# Patient Record
Sex: Female | Born: 1935 | Race: White | Hispanic: No | Smoking: Never smoker
Health system: Southern US, Community
[De-identification: ages and names within clinical notes are randomized; demographics above are authoritative.]

## PROBLEM LIST (undated history)

## (undated) DIAGNOSIS — S2239XA Fracture of one rib, unspecified side, initial encounter for closed fracture: Secondary | ICD-10-CM

## (undated) DIAGNOSIS — E039 Hypothyroidism, unspecified: Secondary | ICD-10-CM

## (undated) DIAGNOSIS — I10 Essential (primary) hypertension: Secondary | ICD-10-CM

## (undated) DIAGNOSIS — E785 Hyperlipidemia, unspecified: Secondary | ICD-10-CM

## (undated) DIAGNOSIS — I2510 Atherosclerotic heart disease of native coronary artery without angina pectoris: Secondary | ICD-10-CM

## (undated) DIAGNOSIS — M353 Polymyalgia rheumatica: Secondary | ICD-10-CM

## (undated) DIAGNOSIS — E119 Type 2 diabetes mellitus without complications: Secondary | ICD-10-CM

## (undated) HISTORY — DX: Hyperlipidemia, unspecified: E78.5

## (undated) HISTORY — DX: Atherosclerotic heart disease of native coronary artery without angina pectoris: I25.10

## (undated) HISTORY — DX: Hypothyroidism, unspecified: E03.9

## (undated) HISTORY — DX: Polymyalgia rheumatica: M35.3

## (undated) HISTORY — DX: Fracture of one rib, unspecified side, initial encounter for closed fracture: S22.39XA

## (undated) HISTORY — PX: OTHER SURGICAL HISTORY: SHX169

## (undated) HISTORY — DX: Type 2 diabetes mellitus without complications: E11.9

## (undated) HISTORY — PX: CORONARY ARTERY BYPASS GRAFT: SHX141

---

## 1976-01-13 HISTORY — PX: ABDOMINAL HYSTERECTOMY: SHX81

## 2004-10-20 ENCOUNTER — Ambulatory Visit: Payer: Self-pay | Admitting: Internal Medicine

## 2010-03-26 ENCOUNTER — Ambulatory Visit: Payer: Self-pay | Admitting: Family Medicine

## 2010-05-14 ENCOUNTER — Ambulatory Visit: Payer: Self-pay | Admitting: Ophthalmology

## 2010-07-23 ENCOUNTER — Ambulatory Visit: Payer: Self-pay | Admitting: Ophthalmology

## 2011-06-05 ENCOUNTER — Ambulatory Visit: Payer: Self-pay

## 2012-06-07 ENCOUNTER — Ambulatory Visit: Payer: Self-pay

## 2014-04-10 DIAGNOSIS — Z0000 Encounter for general adult medical examination without abnormal findings: Secondary | ICD-10-CM | POA: Diagnosis not present

## 2014-04-10 DIAGNOSIS — I10 Essential (primary) hypertension: Secondary | ICD-10-CM | POA: Diagnosis not present

## 2014-06-05 ENCOUNTER — Ambulatory Visit
Admission: RE | Admit: 2014-06-05 | Discharge: 2014-06-05 | Disposition: A | Discharge status: Home / Self Care | Payer: Medicare PPO | Source: Ambulatory Visit | Attending: Unknown Physician Specialty | Admitting: Unknown Physician Specialty

## 2014-06-05 ENCOUNTER — Other Ambulatory Visit: Payer: Self-pay | Admitting: Unknown Physician Specialty

## 2014-06-05 DIAGNOSIS — R700 Elevated erythrocyte sedimentation rate: Secondary | ICD-10-CM

## 2014-06-05 DIAGNOSIS — R109 Unspecified abdominal pain: Secondary | ICD-10-CM

## 2014-06-05 DIAGNOSIS — R1030 Lower abdominal pain, unspecified: Secondary | ICD-10-CM

## 2014-06-05 DIAGNOSIS — R0602 Shortness of breath: Secondary | ICD-10-CM

## 2014-06-06 ENCOUNTER — Other Ambulatory Visit: Payer: Self-pay | Admitting: Unknown Physician Specialty

## 2014-06-06 ENCOUNTER — Ambulatory Visit
Admission: RE | Admit: 2014-06-06 | Discharge: 2014-06-06 | Disposition: A | Discharge status: Home / Self Care | Payer: Medicare PPO | Source: Ambulatory Visit | Attending: Unknown Physician Specialty | Admitting: Unknown Physician Specialty

## 2014-06-06 DIAGNOSIS — R700 Elevated erythrocyte sedimentation rate: Secondary | ICD-10-CM

## 2014-06-06 DIAGNOSIS — I700 Atherosclerosis of aorta: Secondary | ICD-10-CM | POA: Diagnosis not present

## 2014-06-06 DIAGNOSIS — K819 Cholecystitis, unspecified: Secondary | ICD-10-CM | POA: Diagnosis not present

## 2014-06-06 DIAGNOSIS — N281 Cyst of kidney, acquired: Secondary | ICD-10-CM | POA: Diagnosis not present

## 2014-06-06 DIAGNOSIS — R109 Unspecified abdominal pain: Secondary | ICD-10-CM | POA: Insufficient documentation

## 2014-06-06 HISTORY — DX: Essential (primary) hypertension: I10

## 2014-06-06 MED ORDER — IOHEXOL 300 MG/ML  SOLN
100.0000 mL | Freq: Once | INTRAMUSCULAR | Status: AC | PRN
  Administered 2014-06-06: 100 mL via INTRAVENOUS

## 2014-06-08 DIAGNOSIS — I10 Essential (primary) hypertension: Secondary | ICD-10-CM | POA: Insufficient documentation

## 2014-06-08 DIAGNOSIS — E1169 Type 2 diabetes mellitus with other specified complication: Secondary | ICD-10-CM | POA: Insufficient documentation

## 2014-06-08 DIAGNOSIS — E785 Hyperlipidemia, unspecified: Secondary | ICD-10-CM

## 2014-06-08 DIAGNOSIS — M353 Polymyalgia rheumatica: Secondary | ICD-10-CM

## 2014-06-08 DIAGNOSIS — E063 Autoimmune thyroiditis: Secondary | ICD-10-CM | POA: Insufficient documentation

## 2014-06-08 DIAGNOSIS — E039 Hypothyroidism, unspecified: Secondary | ICD-10-CM | POA: Insufficient documentation

## 2014-06-08 DIAGNOSIS — E038 Other specified hypothyroidism: Secondary | ICD-10-CM

## 2014-06-08 HISTORY — DX: Polymyalgia rheumatica: M35.3

## 2014-06-19 ENCOUNTER — Encounter: Payer: Self-pay | Admitting: Unknown Physician Specialty

## 2014-06-19 ENCOUNTER — Ambulatory Visit (INDEPENDENT_AMBULATORY_CARE_PROVIDER_SITE_OTHER): Payer: Medicare PPO | Admitting: Unknown Physician Specialty

## 2014-06-19 VITALS — BP 123/70 | HR 54 | Temp 98.4°F | Ht 60.0 in | Wt 125.6 lb

## 2014-06-19 DIAGNOSIS — M353 Polymyalgia rheumatica: Secondary | ICD-10-CM

## 2014-06-19 DIAGNOSIS — I10 Essential (primary) hypertension: Secondary | ICD-10-CM

## 2014-06-19 DIAGNOSIS — E785 Hyperlipidemia, unspecified: Secondary | ICD-10-CM

## 2014-06-19 NOTE — Patient Instructions (Addendum)
Take Prednisone with food.  Restart Levothyroxine and Vitamin D.  Monitor BP at home.    Polymyalgia Rheumatica Polymyalgia rheumatica (also called PMR or polymyalgia) is a rheumatologic (arthritic) condition that causes pain and morning stiffness in your neck, shoulders, and hips. It is an inflammatory condition. In some people, inflammation of certain structures in the shoulder, hips, or other joints can be seen on special testing. It does not cause joint destruction, as occurs in other arthritic conditions. It usually occurs after 79 years of age, and is more common as you age. It can be confused with several other diseases, but it is usually easily treated. People with PMR often have, or can develop, a more severe rheumatologic condition called giant cell arteritis (also called CGA or temporal arteritis).  CAUSES  The exact cause of PMR is not known.   There are genetic factors involved.  Viruses have been suspected in the cause of PMR. This has not been proven. SYMPTOMS   Aching, pain, and morning stiffness your neck, both shoulders, or both hips.  Symptoms usually start slowly and build gradually.  Morning stiffness usually lasts at least 30 minutes.  Swelling and tenderness in other joints of the arms, hands, legs, and feet may occur.  Swelling and inflammation in the wrists can cause nerve inflammation at the wrist (carpal tunnel syndrome).  You may also have low grade fever, fatigue, weakness, decreased appetite and weight loss. DIAGNOSIS   Your caregiver may suspect that you have PMR based on your description of your symptoms and on your exam.  Your caregiver will examine you to be sure you do not have diseases that can be confused with PMR. These diseases include rheumatoid arthritis, fibromyalgia, or thyroid disease.  Your caregiver should check for signs of giant cell arteritis. This can cause serious complications such as blindness.  Lab tests can help confirm that you  have PMR and not other diseases, but are sometimes inconclusive.  X-rays cannot show PMR. However, it can identify other diseases like rheumatoid arthritis. Your caregiver may have you see a specialist in arthritis and inflammatory diseases (rheumatologist). TREATMENT  The goal of treatment is relief of symptoms. Treatment does not shorten the course of the illness or prevent complications. With proper treatment, you usually feel better almost right away.   The initial treatment of PMR is usually a cortisone (steroid) medication. Your caregiver will help determine a starting dose. The dose is gradually reduced every few weeks to months. Treatment usually lasts one to three years.  Other stronger medications are rarely needed. They will only be prescribed if your symptoms do not get better on cortisone medication alone, or if they recur as the dose is reduced.  Cortisone medication can have different side effects. With the doses of cortisone needed for PMR, the side effects can affect bones and joints, blood sugar control in diabetes, and mood changes. Discuss this with your caregiver.  Your caregiver will evaluate you regularly during your treatment. They will do this in order to assess progress and to check for complications of the illness or treatment.  Physical therapy is sometimes useful. This is especially true if your joints are still stiff after other symptoms have improved. HOME CARE INSTRUCTIONS   Follow your caregiver's instructions. Do not change your dose of cortisone medication on your own.  Keep your appointments for follow-up lab tests and caregiver visits. Your lab tests need to be monitored. You must get checked periodically for giant cell arteritis.  Follow your caregiver's guidance regarding physical activity (usually no restrictions are needed) or physical therapy.  Your caregiver may have instructions to prevent or check for side effects from cortisone medication  (including bone density testing or treatment). Follow their instructions carefully. SEEK MEDICAL CARE IF:   You develop any side effects from treatment. Side effects can include:  Elevated blood pressure.  High blood sugar (or worsening of diabetes, if you are diabetic).  Difficulty fighting off infections.  Weight gain.  Weakness of the bones (osteoporosis).  Your aches, pains, morning stiffness, or other symptoms get worse with time. This is especially true after your dose of cortisone is reduced.  You develop new joint symptoms (pain, swelling, etc.) SEEK IMMEDIATE MEDICAL CARE IF:   You develop a severe headache.  You start vomiting.  You have problems with your vision.  You have an oral temperature above 102 F (38.9 C), not controlled by medicine. Document Released: 02/06/2004 Document Revised: 12/16/2011 Document Reviewed: 05/21/2008 [facility] Patient Information 2015 [employer] [place]. This information is not intended to replace advice given to you by your health care provider. Make sure you discuss any questions you have with your health care provider.

## 2014-06-19 NOTE — Progress Notes (Signed)
   BP 123/70 mmHg  Pulse 54  Temp(Src) 98.4 F (36.9 C) (Oral)  Ht 5' (1.524 m)  Wt 125 lb 9.6 oz (56.972 kg)  BMI 24.53 kg/m2  SpO2 95%   Subjective:    Patient ID: Kirsten Riley, female    DOB: 06/13/1931, 81 y.o.   MRN: [id number]  HPI: Kirsten Riley is a 81 y.o. female presenting on 06/19/2014 for Follow-up   Polymyalgia rheumatica: Pt is doing much better.  She continues to have bad days, but doing all her work other than mowing the lawn.  She is gardening and doing her housework.  She is still having some abdominal pain with lying down and getting up.    Hypertension/Hyperlipidemia: Stopped Nifedipine and statin.  Not taking anything besides her Prednisone.  Will need to restart Stain with history of CAD.    Relevant past medical, surgical, family and social history reviewed and updated as indicated. Interim medical history since our last visit reviewed. Allergies and medications reviewed and updated.  Review of Systems  Per HPI unless specifically indicated above     Objective:    BP 123/70 mmHg  Pulse 54  Temp(Src) 98.4 F (36.9 C) (Oral)  Ht 5' (1.524 m)  Wt 125 lb 9.6 oz (56.972 kg)  BMI 24.53 kg/m2  SpO2 95%  Wt Readings from Last 3 Encounters:  06/19/14 125 lb 9.6 oz (56.972 kg)  06/05/14 128 lb (58.06 kg)    Physical Exam  Constitutional: She is oriented to person, place, and time. She appears well-developed and well-nourished. No distress.  HENT:  Head: Normocephalic and atraumatic.  Eyes: Conjunctivae and lids are normal. Right eye exhibits no discharge. Left eye exhibits no discharge. No scleral icterus.  Cardiovascular: Normal rate and regular rhythm.   Pulmonary/Chest: Effort normal. No respiratory distress.  Abdominal: Normal appearance. She exhibits no distension. There is no splenomegaly or hepatomegaly. There is no tenderness.  Musculoskeletal: Normal range of motion.  Neurological: She is alert and oriented to person, place, and time.  Skin: Skin  is intact. No rash noted. No pallor.  Psychiatric: She has a normal mood and affect. Her behavior is normal. Judgment and thought content normal.          Assessment & Plan:   Problem List Items Addressed This Visit      Cardiovascular and Mediastinum   Essential hypertension, benign    Pt stopped Nifedipine.  BP is stable.  Reassess in 6 weeks        Other   Hyperlipidemia    Pt not taking Statin.  As for now, continue off the statin due to myalgias      Polymyalgia rheumatica - Primary    Pt with much improvement in symptoms.  Discussed slow taper off which we will start 6 weeks from now.              Follow up plan: Pt will restart Levothyroxine and Vitamin D.  Stay off Nifedipine and statins for now.   Recheck 6 weeks

## 2014-06-19 NOTE — Assessment & Plan Note (Signed)
Pt stopped Nifedipine.  BP is stable.  Reassess in 6 weeks

## 2014-06-19 NOTE — Assessment & Plan Note (Signed)
Pt not taking Statin.  As for now, continue off the statin due to myalgias

## 2014-06-19 NOTE — Assessment & Plan Note (Signed)
Pt with much improvement in symptoms.  Discussed slow taper off which we will start 6 weeks from now.

## 2014-06-20 ENCOUNTER — Other Ambulatory Visit: Payer: Self-pay

## 2014-06-20 ENCOUNTER — Telehealth: Payer: Self-pay | Admitting: Unknown Physician Specialty

## 2014-06-20 MED ORDER — PREDNISONE 10 MG PO TABS: 10.0000 mg | ORAL_TABLET | Freq: Two times a day (BID) | ORAL | Status: DC

## 2014-06-20 NOTE — Telephone Encounter (Signed)
Spoke with patients pharmacy, she will be out of her prednisone on Friday, is she to continue this medication? If so [facility]

## 2014-06-20 NOTE — Telephone Encounter (Signed)
Pt called stated when she came in the other day she was not given her RX for Prednisone. Pharm [facility]. Please call with any concerns or questions @ [phone]. Thanks.

## 2014-07-04 ENCOUNTER — Telehealth: Payer: Self-pay | Admitting: Unknown Physician Specialty

## 2014-07-04 MED ORDER — PREDNISONE 10 MG PO TABS: 10.0000 mg | ORAL_TABLET | Freq: Two times a day (BID) | ORAL | Status: DC

## 2014-07-04 NOTE — Telephone Encounter (Signed)
Pt called stated Elnita Maxwell has her on prednisone. States Elnita Maxwell is going on vacation next month and she will need 20 more pills to last her until her next appt with Elnita Maxwell. Pharm is [facility]. Thanks.

## 2014-07-04 NOTE — Telephone Encounter (Signed)
Routing to provider. Practice partner number is [phone].

## 2014-07-31 ENCOUNTER — Ambulatory Visit (INDEPENDENT_AMBULATORY_CARE_PROVIDER_SITE_OTHER): Payer: Medicare PPO | Admitting: Unknown Physician Specialty

## 2014-07-31 ENCOUNTER — Encounter: Payer: Self-pay | Admitting: Unknown Physician Specialty

## 2014-07-31 VITALS — BP 108/69 | HR 91 | Temp 98.5°F | Ht 60.1 in | Wt 119.8 lb

## 2014-07-31 DIAGNOSIS — I10 Essential (primary) hypertension: Secondary | ICD-10-CM

## 2014-07-31 DIAGNOSIS — M353 Polymyalgia rheumatica: Secondary | ICD-10-CM

## 2014-07-31 DIAGNOSIS — E785 Hyperlipidemia, unspecified: Secondary | ICD-10-CM

## 2014-07-31 MED ORDER — LOVASTATIN 40 MG PO TABS: 40.0000 mg | ORAL_TABLET | Freq: Every day | ORAL | Status: DC

## 2014-07-31 MED ORDER — PREDNISONE 10 MG PO TABS: 10.0000 mg | ORAL_TABLET | Freq: Two times a day (BID) | ORAL | Status: DC

## 2014-07-31 NOTE — Assessment & Plan Note (Signed)
Re-start Lovastatin 

## 2014-07-31 NOTE — Assessment & Plan Note (Signed)
Will restart Lovastatin before tapering steroids.  Begin taper in about 1 month.  Will refer to rheumatology if having problems with the taper.

## 2014-07-31 NOTE — Patient Instructions (Signed)
We will restart your cholesterol medications.  Recheck in 1 months for steroid taper.

## 2014-07-31 NOTE — Progress Notes (Signed)
   BP 108/69 mmHg  Pulse 91  Temp(Src) 98.5 F (36.9 C)  Ht 5' 0.1" (1.527 m)  Wt 119 lb 12.8 oz (54.341 kg)  BMI 23.31 kg/m2  SpO2 97%  LMP  (LMP Unknown)   Subjective:    Patient ID: Kirsten Riley, female    DOB: 05/26/1931, 81 y.o.   MRN: [id number]  HPI: Kirsten Riley is a 81 y.o. female presenting on 07/31/2014 for Hypertension and Hyperlipidemia   Polymyalgia rheumatica: Pt is doing much better but does not feel she is back to normal.   For the past 2 weeks she started mowing her yard.  She is gardening and doing her housework.  She is still having some abdominal pain with lying down and getting up.    Hypertension/Hyperlipidemia: Stopped Nifedipine and statin.  Not taking anything besides her Prednisone. She does have a history of CAD so statin therapy will be important.    Hypothyroid:  Restarted Levothyroxine.    Relevant past medical, surgical, family and social history reviewed and updated as indicated. Interim medical history since our last visit reviewed. Allergies and medications reviewed and updated.  Review of Systems  Per HPI unless specifically indicated above     Objective:    BP 108/69 mmHg  Pulse 91  Temp(Src) 98.5 F (36.9 C)  Ht 5' 0.1" (1.527 m)  Wt 119 lb 12.8 oz (54.341 kg)  BMI 23.31 kg/m2  SpO2 97%  LMP  (LMP Unknown)  Wt Readings from Last 3 Encounters:  07/31/14 119 lb 12.8 oz (54.341 kg)  06/19/14 125 lb 9.6 oz (56.972 kg)  06/05/14 128 lb (58.06 kg)    Physical Exam  Constitutional: She is oriented to person, place, and time. She appears well-developed and well-nourished. No distress.  HENT:  Head: Normocephalic and atraumatic.  Eyes: Conjunctivae and lids are normal. Right eye exhibits no discharge. Left eye exhibits no discharge. No scleral icterus.  Cardiovascular: Normal rate, regular rhythm and normal heart sounds.   Pulmonary/Chest: Effort normal and breath sounds normal. No respiratory distress.  Abdominal: Normal appearance.  There is no splenomegaly or hepatomegaly.  Musculoskeletal: Normal range of motion.  Neurological: She is alert and oriented to person, place, and time.  Skin: Skin is warm, dry and intact. No rash noted. No pallor.  Psychiatric: She has a normal mood and affect. Her behavior is normal. Judgment and thought content normal.        Assessment & Plan:   Problem List Items Addressed This Visit      Cardiovascular and Mediastinum   Essential hypertension, benign - Primary    Resolved for now.  Stay off Nifedipine.        Relevant Medications   lovastatin (MEVACOR) 40 MG tablet     Other   Hyperlipidemia    Restart Lovastatin      Relevant Medications   lovastatin (MEVACOR) 40 MG tablet   Polymyalgia rheumatica    Will restart Lovastatin before tapering steroids.  Begin taper in about 1 month.  Will refer to rheumatology if having problems with the taper.              Follow up plan: 4 weeks for steroid taper

## 2014-07-31 NOTE — Assessment & Plan Note (Signed)
Resolved for now.  Stay off Nifedipine.

## 2014-08-28 ENCOUNTER — Ambulatory Visit (INDEPENDENT_AMBULATORY_CARE_PROVIDER_SITE_OTHER): Payer: Medicare PPO | Admitting: Unknown Physician Specialty

## 2014-08-28 ENCOUNTER — Encounter: Payer: Self-pay | Admitting: Unknown Physician Specialty

## 2014-08-28 VITALS — BP 99/60 | HR 88 | Temp 97.7°F | Ht 60.1 in | Wt 114.0 lb

## 2014-08-28 DIAGNOSIS — M353 Polymyalgia rheumatica: Secondary | ICD-10-CM | POA: Diagnosis not present

## 2014-08-28 MED ORDER — PREDNISONE 5 MG PO TABS: 5.0000 mg | ORAL_TABLET | Freq: Three times a day (TID) | ORAL | Status: DC

## 2014-08-28 NOTE — Progress Notes (Signed)
   BP 99/60 mmHg  Pulse 88  Temp(Src) 97.7 F (36.5 C)  Ht 5' 0.1" (1.527 m)  Wt 114 lb (51.71 kg)  BMI 22.18 kg/m2  SpO2 96%  LMP  (LMP Unknown)   Subjective:    Patient ID: Kirsten Riley, female    DOB: 12-Oct-1931, 81 y.o.   MRN: [id number]  HPI: Kirsten Riley is a 81 y.o. female  Chief Complaint  Patient presents with  . Follow-up    pt was seen for polymyalgia rheumatica at last visit     Relevant past medical, surgical, family and social history reviewed and updated as indicated. Interim medical history since our last visit reviewed. Allergies and medications reviewed and updated.  Polymyalgia Rheumatica: Reports some stiffness and weakness but improved. She continues her daily activities including mowing the yard, weeding the flowerbeds and all household chores but at a slower pace.  Reports feeling very tired after a full day of working. Denies any change in level of fatigue or stiffness since restarting the lovastatin.  Review of Systems  Constitutional: Negative.  Negative for activity change and appetite change.  HENT: Negative.   Eyes: Negative.   Respiratory: Negative.  Negative for cough, chest tightness, shortness of breath, wheezing and stridor.   Cardiovascular: Negative.  Negative for chest pain, palpitations and leg swelling.  Skin: Negative.  Negative for color change, pallor, rash and wound.  Psychiatric/Behavioral: Negative.  Negative for confusion, sleep disturbance and agitation. The patient is not nervous/anxious.    Per HPI unless specifically indicated above     Objective:    BP 99/60 mmHg  Pulse 88  Temp(Src) 97.7 F (36.5 C)  Ht 5' 0.1" (1.527 m)  Wt 114 lb (51.71 kg)  BMI 22.18 kg/m2  SpO2 96%  LMP  (LMP Unknown)  Wt Readings from Last 3 Encounters:  08/28/14 114 lb (51.71 kg)  07/31/14 119 lb 12.8 oz (54.341 kg)  06/19/14 125 lb 9.6 oz (56.972 kg)    Physical Exam  Constitutional: She is oriented to person, place, and time. She appears  well-developed and well-nourished. No distress.  HENT:  Head: Normocephalic and atraumatic.  Neck: Normal range of motion.  Cardiovascular: Normal rate, regular rhythm and normal heart sounds.  Exam reveals no gallop and no friction rub.   No murmur heard. Pulmonary/Chest: Effort normal and breath sounds normal. No respiratory distress. She has no wheezes. She has no rales. She exhibits no tenderness.  Neurological: She is alert and oriented to person, place, and time.  Skin: Skin is warm and dry. No rash noted. She is not diaphoretic. No erythema. No pallor.  Psychiatric: She has a normal mood and affect. Her behavior is normal. Judgment and thought content normal.    No results found for this or any previous visit.    Assessment & Plan:   Problem List Items Addressed This Visit      Unprioritized   Polymyalgia rheumatica - Primary    Started Lovastatin one month ago. Will decrease prednisone to  three times daily and reassess in 6 weeks          Follow up plan: Return in about 6 weeks (around 10/09/2014).

## 2014-08-28 NOTE — Assessment & Plan Note (Signed)
Started Lovastatin one month ago. Will decrease prednisone to  three times daily and reassess in 6 weeks

## 2014-10-09 ENCOUNTER — Encounter: Payer: Self-pay | Admitting: Unknown Physician Specialty

## 2014-10-09 ENCOUNTER — Ambulatory Visit (INDEPENDENT_AMBULATORY_CARE_PROVIDER_SITE_OTHER): Payer: Medicare PPO | Admitting: Unknown Physician Specialty

## 2014-10-09 VITALS — BP 102/67 | HR 89 | Temp 98.3°F | Ht 60.2 in | Wt 110.0 lb

## 2014-10-09 DIAGNOSIS — M353 Polymyalgia rheumatica: Secondary | ICD-10-CM

## 2014-10-09 DIAGNOSIS — Z23 Encounter for immunization: Secondary | ICD-10-CM

## 2014-10-09 DIAGNOSIS — E099 Drug or chemical induced diabetes mellitus without complications: Secondary | ICD-10-CM | POA: Diagnosis not present

## 2014-10-09 DIAGNOSIS — N3945 Continuous leakage: Secondary | ICD-10-CM | POA: Diagnosis not present

## 2014-10-09 DIAGNOSIS — R32 Unspecified urinary incontinence: Secondary | ICD-10-CM

## 2014-10-09 LAB — MICROSCOPIC EXAMINATION: Renal Epithel, UA: NONE SEEN /hpf

## 2014-10-09 LAB — GLUCOSE HEMOCUE WAIVED: GLU HEMOCUE WAIVED: 333 mg/dL — AB (ref 65–99)

## 2014-10-09 MED ORDER — GLIPIZIDE ER 5 MG PO TB24
5.0000 mg | ORAL_TABLET | Freq: Every day | ORAL | Status: DC
Start: 2014-10-09 — End: 2014-11-09

## 2014-10-09 MED ORDER — PREDNISONE 5 MG PO TABS: 5.0000 mg | ORAL_TABLET | Freq: Two times a day (BID) | ORAL | Status: DC

## 2014-10-09 NOTE — Progress Notes (Signed)
BP 102/67 mmHg  Pulse 89  Temp(Src) 98.3 F (36.8 C)  Ht 5' 0.2" (1.529 m)  Wt 110 lb (49.896 kg)  BMI 21.34 kg/m2  SpO2 98%  LMP  (LMP Unknown)   Subjective:    Patient ID: Kirsten Riley, female    DOB: 10-20-1931, 81 y.o.   MRN: [id number]  HPI: Kirsten Riley is a 81 y.o. female  Chief Complaint  Patient presents with  . Follow-up    pt is here for 6 week follow-up for polymyalgia rheumatica   Polymyalgia rheumatica: She is now having weakness of hips/lower extremities. She continues to do her housework and yard work but is still at a slower pace than previously. She can work hard on day then needs to rest the next day. She has difficulty getting up from a kneeling position. She recently knelt down to plug in a landline phone and was unable to get up. She did not have enough strength to push or pull herself up. She had to roll to the living room and eventually get herself into the chair.  Urinary incontinence: New onset within the last few weeks. Currently wearing depends.   No pain and burning.    Diabetes: She has been on prednisone 15-20mg  daily for four months with A1C of 6.1 prior to initiation of prednisone. She has increased weakness over the last month and new onset incontinence. Denies any changes to vision, sensation or chest pain. Becomes tired easy.   Relevant past medical, surgical, family and social history reviewed and updated as indicated. Interim medical history since our last visit reviewed. Allergies and medications reviewed and updated.  Review of Systems  Constitutional: Negative.  Negative for fever, chills, activity change and appetite change.  HENT: Negative.  Negative for congestion, postnasal drip, sinus pressure and sore throat.   Eyes: Negative.  Negative for discharge and redness.  Respiratory: Negative.  Negative for cough, chest tightness, shortness of breath, wheezing and stridor.   Cardiovascular: Negative.  Negative for chest pain, palpitations and  leg swelling.  Genitourinary: Negative for urgency, hematuria and difficulty urinating.  Musculoskeletal: Negative.  Negative for myalgias, back pain, arthralgias and gait problem.  Skin: Negative.  Negative for color change, pallor and rash.  Neurological: Negative for headaches.  Psychiatric/Behavioral: Negative.  Negative for confusion, sleep disturbance and self-injury. The patient is not nervous/anxious.     Per HPI unless specifically indicated above     Objective:    BP 102/67 mmHg  Pulse 89  Temp(Src) 98.3 F (36.8 C)  Ht 5' 0.2" (1.529 m)  Wt 110 lb (49.896 kg)  BMI 21.34 kg/m2  SpO2 98%  LMP  (LMP Unknown)  Wt Readings from Last 3 Encounters:  10/09/14 110 lb (49.896 kg)  08/28/14 114 lb (51.71 kg)  07/31/14 119 lb 12.8 oz (54.341 kg)    Physical Exam  Constitutional: She is oriented to person, place, and time. She appears well-developed and well-nourished. No distress.  HENT:  Head: Normocephalic and atraumatic.  Eyes: Conjunctivae are normal.  Cardiovascular: Normal rate, regular rhythm and normal heart sounds.  Exam reveals no gallop and no friction rub.   No murmur heard. Pulmonary/Chest: Effort normal and breath sounds normal. No respiratory distress. She has no wheezes. She has no rales. She exhibits no tenderness.  Musculoskeletal: Normal range of motion.  Neurological: She is alert and oriented to person, place, and time.  Skin: Skin is warm and dry. No rash noted. She is not diaphoretic.  No erythema. No pallor.  Psychiatric: She has a normal mood and affect. Her behavior is normal. Judgment and thought content normal.    No results found for this or any previous visit.    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 Will decrease prednisone to  twice daily. Will begin glipizide  and recheck in 3-4 weeks.    Relevant Medications    glipiZIDE (GLUCOTROL XL) 5 MG 24 hr tablet    Other Relevant Orders    Comprehensive metabolic panel       Discussed patient and plan of care with Dr. Laural Benes.    Follow up plan: Return for 3-4 weeks.

## 2014-10-09 NOTE — Assessment & Plan Note (Addendum)
No improvement in symptoms. Referral to Rheumatologist

## 2014-10-10 LAB — COMPREHENSIVE METABOLIC PANEL
A/G RATIO: 1.7 (ref 1.1–2.5)
ALT: 50 IU/L — AB (ref 0–32)
AST: 40 IU/L (ref 0–40)
Albumin: 3.8 g/dL (ref 3.5–4.7)
Alkaline Phosphatase: 96 IU/L (ref 39–117)
BILIRUBIN TOTAL: 1.1 mg/dL (ref 0.0–1.2)
BUN/Creatinine Ratio: 16 (ref 11–26)
BUN: 10 mg/dL (ref 8–27)
CHLORIDE: 95 mmol/L — AB (ref 97–108)
CO2: 22 mmol/L (ref 18–29)
Calcium: 9.3 mg/dL (ref 8.7–10.3)
Creatinine, Ser: 0.63 mg/dL (ref 0.57–1.00)
GFR calc non Af Amer: 84 mL/min/{1.73_m2} (ref >59)
GFR, EST AFRICAN AMERICAN: 97 mL/min/{1.73_m2} (ref >59)
Globulin, Total: 2.2 g/dL (ref 1.5–4.5)
Glucose: 317 mg/dL — ABNORMAL HIGH (ref 65–99)
Potassium: 4.3 mmol/L (ref 3.5–5.2)
Sodium: 137 mmol/L (ref 134–144)
TOTAL PROTEIN: 6 g/dL (ref 6.0–8.5)

## 2014-10-11 LAB — UA/M W/RFLX CULTURE, ROUTINE

## 2014-10-29 DIAGNOSIS — E119 Type 2 diabetes mellitus without complications: Secondary | ICD-10-CM | POA: Diagnosis not present

## 2014-10-29 DIAGNOSIS — M25511 Pain in right shoulder: Secondary | ICD-10-CM | POA: Diagnosis not present

## 2014-10-29 DIAGNOSIS — M353 Polymyalgia rheumatica: Secondary | ICD-10-CM | POA: Diagnosis not present

## 2014-10-29 DIAGNOSIS — G8929 Other chronic pain: Secondary | ICD-10-CM | POA: Diagnosis not present

## 2014-10-29 DIAGNOSIS — M791 Myalgia: Secondary | ICD-10-CM | POA: Diagnosis not present

## 2014-10-29 DIAGNOSIS — M25512 Pain in left shoulder: Secondary | ICD-10-CM | POA: Diagnosis not present

## 2014-11-09 ENCOUNTER — Ambulatory Visit (INDEPENDENT_AMBULATORY_CARE_PROVIDER_SITE_OTHER): Payer: Medicare PPO | Admitting: Unknown Physician Specialty

## 2014-11-09 ENCOUNTER — Encounter: Payer: Self-pay | Admitting: Unknown Physician Specialty

## 2014-11-09 VITALS — BP 100/65 | HR 90 | Temp 98.3°F | Ht 60.2 in | Wt 120.8 lb

## 2014-11-09 DIAGNOSIS — E099 Drug or chemical induced diabetes mellitus without complications: Secondary | ICD-10-CM

## 2014-11-09 DIAGNOSIS — T380X5A Adverse effect of glucocorticoids and synthetic analogues, initial encounter: Secondary | ICD-10-CM | POA: Diagnosis not present

## 2014-11-09 DIAGNOSIS — M353 Polymyalgia rheumatica: Secondary | ICD-10-CM | POA: Diagnosis not present

## 2014-11-09 LAB — BAYER DCA HB A1C WAIVED: HB A1C: 11.6 % — AB (ref <7.0)

## 2014-11-09 LAB — GLUCOSE HEMOCUE WAIVED: GLU HEMOCUE WAIVED: 363 mg/dL — AB (ref 65–99)

## 2014-11-09 MED ORDER — GLIPIZIDE ER 10 MG PO TB24: 10.0000 mg | ORAL_TABLET | Freq: Every day | ORAL | Status: DC

## 2014-11-09 NOTE — Progress Notes (Signed)
BP 100/65 mmHg  Pulse 90  Temp(Src) 98.3 F (36.8 C)  Ht 5' 0.2" (1.529 m)  Wt 120 lb 12.8 oz (54.795 kg)  BMI 23.44 kg/m2  SpO2 97%  LMP  (LMP Unknown)   Subjective:    Patient ID: Kirsten Riley, female    DOB: 12/18/1931, 81 y.o.   MRN: [id number]  HPI: Kirsten FlightJoann H Riley is a 81 y.o. female  Chief Complaint  Patient presents with  . Follow-up    pt states she is here for a follow-up on polymyalgia rheumatica   Polymyalgia rheumatic She states she is still having abdominal tenderness and is having hair loss she denies any other pain or fatigue.  She states she is able to do housework and yard work at a regular pace.  She is currently taking 7.5 mg a day prescribed by Dr. [facility].  She as a follow-up appointment with Dr. Gavin [facility] on November 16, 2014  Urinary Incontinence She states this has improved since she started taking the glipizide.  She wears depends only when she is running errands.  Denies pain, burning, or urgency  Diabetes She is currently taking 7.5 mg prednisone daily.  She denies diplopia, vision loss, or blurred vision.   Pertinent negatives denies chest pain, palpitations, polydipsia, shortness of breath, or edema  Relevant past medical, surgical, family and social history reviewed and updated as indicated. Interim medical history since our last visit reviewed. Allergies and medications reviewed and updated.  Review of Systems  Constitutional: Positive for appetite change. Negative for fever, chills, diaphoresis, activity change, fatigue and unexpected weight change.  Respiratory: Negative.   Cardiovascular: Negative.   Gastrointestinal: Negative for nausea, vomiting, diarrhea and constipation.       Abdominal tenderness intermittently when she coughs or lies down  Endocrine: Positive for polyphagia and polyuria.  Genitourinary: Negative for dysuria, urgency, frequency, hematuria, decreased urine volume and difficulty urinating.       Urinary incontinence   Musculoskeletal: Positive for arthralgias.  Skin: Negative.   Neurological: Negative.     Per HPI unless specifically indicated above     Objective:    BP 100/65 mmHg  Pulse 90  Temp(Src) 98.3 F (36.8 C)  Ht 5' 0.2" (1.529 m)  Wt 120 lb 12.8 oz (54.795 kg)  BMI 23.44 kg/m2  SpO2 97%  LMP  (LMP Unknown)  Wt Readings from Last 3 Encounters:  11/09/14 120 lb 12.8 oz (54.795 kg)  10/09/14 110 lb (49.896 kg)  08/28/14 114 lb (51.71 kg)    Physical Exam  Constitutional: She is oriented to person, place, and time. She appears well-developed and well-nourished. No distress.  HENT:  Head: Normocephalic and atraumatic.  Neck: Normal range of motion. Neck supple.  Cardiovascular: Normal rate, regular rhythm, normal heart sounds and intact distal pulses.   Pulmonary/Chest: Effort normal and breath sounds normal. No respiratory distress. She has no wheezes.  Musculoskeletal: Normal range of motion. She exhibits no edema or tenderness.  Neurological: She is alert and oriented to person, place, and time.  Skin: Skin is warm and dry. No rash noted. She is not diaphoretic. No erythema. No pallor.  Psychiatric: She has a normal mood and affect. Her behavior is normal. Judgment and thought content normal.     Assessment & Plan:   Problem List Items Addressed This Visit      Unprioritized   Polymyalgia rheumatica (HCC)    Managed by Dr Gavin [facility]ern[facility] follow-up appointment with him November 16, 2014  Other Visit Diagnoses    Steroid-induced diabetes (HCC)    -  Primary    Serum Glucose 363 Hgb A1C 11.6 increased Glipizide to 10 mg daily.  Pt has an appointment with Dr. Gavin Potters on November 16, 2014. Consider Insulin next visit.      Relevant Medications    glipiZIDE (GLUCOTROL XL) 10 MG 24 hr tablet    Other Relevant Orders    Comprehensive metabolic panel    Bayer DCA Hb A1c Waived    Glucose Hemocue Waived    Ambulatory referral to diabetic education        Follow up  plan: Return in about 4 weeks (around 12/07/2014) for f/u also needs lipid panel, CMP glucose, B12 for Macrocytosi.

## 2014-11-09 NOTE — Assessment & Plan Note (Signed)
Managed by Dr Gavin [facility] follow-up appointment with him November 16, 2014

## 2014-11-16 DIAGNOSIS — E119 Type 2 diabetes mellitus without complications: Secondary | ICD-10-CM | POA: Diagnosis not present

## 2014-11-16 DIAGNOSIS — M353 Polymyalgia rheumatica: Secondary | ICD-10-CM | POA: Diagnosis not present

## 2014-12-10 ENCOUNTER — Ambulatory Visit (INDEPENDENT_AMBULATORY_CARE_PROVIDER_SITE_OTHER): Payer: Medicare PPO | Admitting: Unknown Physician Specialty

## 2014-12-10 ENCOUNTER — Encounter: Payer: Self-pay | Admitting: Unknown Physician Specialty

## 2014-12-10 VITALS — BP 125/76 | HR 69 | Temp 98.2°F | Ht 60.3 in | Wt 117.4 lb

## 2014-12-10 DIAGNOSIS — E785 Hyperlipidemia, unspecified: Secondary | ICD-10-CM | POA: Diagnosis not present

## 2014-12-10 DIAGNOSIS — E099 Drug or chemical induced diabetes mellitus without complications: Secondary | ICD-10-CM | POA: Diagnosis not present

## 2014-12-10 DIAGNOSIS — D539 Nutritional anemia, unspecified: Secondary | ICD-10-CM

## 2014-12-10 DIAGNOSIS — E119 Type 2 diabetes mellitus without complications: Secondary | ICD-10-CM

## 2014-12-10 DIAGNOSIS — M353 Polymyalgia rheumatica: Secondary | ICD-10-CM | POA: Diagnosis not present

## 2014-12-10 HISTORY — DX: Type 2 diabetes mellitus without complications: E11.9

## 2014-12-10 LAB — LIPID PANEL PICCOLO, WAIVED
CHOLESTEROL PICCOLO, WAIVED: 117 mg/dL (ref <200)
Chol/HDL Ratio Piccolo,Waive: 3.1 mg/dL
HDL Chol Piccolo, Waived: 37 mg/dL — ABNORMAL LOW (ref >59)
LDL Chol Calc Piccolo Waived: 57 mg/dL (ref <100)
TRIGLYCERIDES PICCOLO,WAIVED: 114 mg/dL (ref <150)
VLDL Chol Calc Piccolo,Waive: 23 mg/dL (ref <30)

## 2014-12-10 LAB — GLUCOSE HEMOCUE WAIVED: Glu Hemocue Waived: 120 mg/dL — ABNORMAL HIGH (ref 65–99)

## 2014-12-10 MED ORDER — GLIPIZIDE ER 5 MG PO TB24: 5.0000 mg | ORAL_TABLET | Freq: Every day | ORAL | Status: DC

## 2014-12-10 NOTE — Assessment & Plan Note (Signed)
BS is 120 this AM.  Will decrease Glucotrol to 5 mg.

## 2014-12-10 NOTE — Progress Notes (Signed)
BP 125/76 mmHg  Pulse 69  Temp(Src) 98.2 F (36.8 C)  Ht 5' 0.3" (1.532 m)  Wt 117 lb 6.4 oz (53.252 kg)  BMI 22.69 kg/m2  SpO2 96%  LMP  (LMP Unknown)   Subjective:    Patient ID: Kirsten Riley, female    DOB: [birth year], 81 y.o.   MRN: [id number]  HPI: Kirsten Riley is a 81 y.o. female  Chief Complaint  Patient presents with  . Diabetes    pt states she has not ate ay sweets she was here last and would like to have blood checked today  . Pain    pt states she has been having more knee (back of knees), and hand pain. States it feels like hands are swollen.   Pt is here with her daughter.    Polymyalgia rheumatic She states she is still having abdominal tenderness and is having hair loss she denies any other pain or fatigue.  She states she is able to do housework and yard work at a regular pace.  She is currently taking 5 mg a day prescribed by Dr. Gavin Potters.  She feels like her knees and hands are painful.  She is wondering if I can increase the dose of prednisone to increase her arthritis.    Diabetes She is currently taking 5 mg prednisone daily.  She denies diplopia, vision loss, or blurred vision.   Pertinent negatives denies chest pain, palpitations, polydipsia, shortness of breath, or edema.   Relevant past medical, surgical, family and social history reviewed and updated as indicated. Interim medical history since our last visit reviewed. Allergies and medications reviewed and updated.  Review of Systems  Constitutional: Positive for appetite change. Negative for fever, chills, diaphoresis, activity change, fatigue and unexpected weight change.  Respiratory: Negative.   Cardiovascular: Negative.   Gastrointestinal: Negative for nausea, vomiting, diarrhea and constipation.       Abdominal tenderness intermittently when she coughs or lies down  Endocrine: Positive for polyphagia and polyuria.  Genitourinary: Negative for dysuria, urgency, frequency, hematuria, decreased  urine volume and difficulty urinating.       Urinary incontinence  Musculoskeletal: Positive for arthralgias.  Skin: Negative.   Neurological: Negative.     Per HPI unless specifically indicated above     Objective:    BP 125/76 mmHg  Pulse 69  Temp(Src) 98.2 F (36.8 C)  Ht 5' 0.3" (1.532 m)  Wt 117 lb 6.4 oz (53.252 kg)  BMI 22.69 kg/m2  SpO2 96%  LMP  (LMP Unknown)  Wt Readings from Last 3 Encounters:  12/10/14 117 lb 6.4 oz (53.252 kg)  11/09/14 120 lb 12.8 oz (54.795 kg)  10/09/14 110 lb (49.896 kg)    Physical Exam  Constitutional: She is oriented to person, place, and time. She appears well-developed and well-nourished. No distress.  HENT:  Head: Normocephalic and atraumatic.  Neck: Normal range of motion. Neck supple.  Cardiovascular: Normal rate, regular rhythm, normal heart sounds and intact distal pulses.   Pulmonary/Chest: Effort normal and breath sounds normal. No respiratory distress. She has no wheezes.  Musculoskeletal: Normal range of motion. She exhibits no edema or tenderness.  Neurological: She is alert and oriented to person, place, and time.  Skin: Skin is warm and dry. No rash noted. She is not diaphoretic. No erythema. No pallor.  Psychiatric: She has a normal mood and affect. Her behavior is normal. Judgment and thought content normal.   Glucose is 120 this AM  Assessment & Plan:   Problem List Items Addressed This Visit      Unprioritized   Hyperlipidemia    Excellent cholesterol results.      Relevant Orders   Lipid Panel Piccolo, Waived   Polymyalgia rheumatica (HCC)    Discussed Prednisone and symptoms seem more arthritic.  Restart Glucosamine and Chondroiton.  Will defer decision on prednisone to Dr. Gavin Riley.  I am afraid that going up on Prednisone will worsen DM which is now under control.        Diabetes mellitus (HCC) - Primary    BS is 120 this AM.  Will decrease Glucotrol to 5 mg.        Relevant Medications    glipiZIDE (GLUCOTROL XL) 5 MG 24 hr tablet   Other Relevant Orders   Comprehensive metabolic panel   Glucose Hemocue Waived    Other Visit Diagnoses    Macrocytic anemia        Relevant Orders    Vitamin B12        Follow up plan: Return in about 3 months (around 03/12/2015).

## 2014-12-10 NOTE — Assessment & Plan Note (Signed)
Discussed Prednisone and symptoms seem more arthritic.  Restart Glucosamine and Chondroiton.  Will defer decision on prednisone to Dr. Gavin [facility].  I am afraid that going up on Prednisone will worsen DM which is now under control.

## 2014-12-10 NOTE — Assessment & Plan Note (Signed)
Excellent cholesterol results.

## 2014-12-11 LAB — COMPREHENSIVE METABOLIC PANEL
A/G RATIO: 1.8 (ref 1.1–2.5)
ALK PHOS: 96 IU/L (ref 39–117)
ALT: 29 IU/L (ref 0–32)
AST: 30 IU/L (ref 0–40)
Albumin: 3.9 g/dL (ref 3.5–4.7)
BUN/Creatinine Ratio: 12 (ref 11–26)
BUN: 8 mg/dL (ref 8–27)
Bilirubin Total: 0.6 mg/dL (ref 0.0–1.2)
CO2: 22 mmol/L (ref 18–29)
CREATININE: 0.69 mg/dL (ref 0.57–1.00)
Calcium: 10 mg/dL (ref 8.7–10.3)
Chloride: 104 mmol/L (ref 97–106)
GFR calc Af Amer: 94 mL/min/{1.73_m2} (ref >59)
GFR calc non Af Amer: 82 mL/min/{1.73_m2} (ref >59)
GLOBULIN, TOTAL: 2.2 g/dL (ref 1.5–4.5)
Glucose: 109 mg/dL — ABNORMAL HIGH (ref 65–99)
POTASSIUM: 4.3 mmol/L (ref 3.5–5.2)
Sodium: 141 mmol/L (ref 136–144)
Total Protein: 6.1 g/dL (ref 6.0–8.5)

## 2014-12-11 LAB — VITAMIN B12: VITAMIN B 12: 1104 pg/mL — AB (ref 211–946)

## 2014-12-17 DIAGNOSIS — M353 Polymyalgia rheumatica: Secondary | ICD-10-CM | POA: Diagnosis not present

## 2015-01-09 ENCOUNTER — Emergency Department: Payer: Medicare PPO

## 2015-01-09 ENCOUNTER — Emergency Department
Admission: EM | Admit: 2015-01-09 | Discharge: 2015-01-09 | Disposition: A | Discharge status: Home / Self Care | Payer: Medicare PPO | Attending: Emergency Medicine | Admitting: Emergency Medicine

## 2015-01-09 ENCOUNTER — Encounter: Payer: Self-pay | Admitting: Medical Oncology

## 2015-01-09 DIAGNOSIS — Y9289 Other specified places as the place of occurrence of the external cause: Secondary | ICD-10-CM | POA: Insufficient documentation

## 2015-01-09 DIAGNOSIS — R0781 Pleurodynia: Secondary | ICD-10-CM

## 2015-01-09 DIAGNOSIS — W1839XA Other fall on same level, initial encounter: Secondary | ICD-10-CM | POA: Insufficient documentation

## 2015-01-09 DIAGNOSIS — E119 Type 2 diabetes mellitus without complications: Secondary | ICD-10-CM | POA: Diagnosis not present

## 2015-01-09 DIAGNOSIS — I10 Essential (primary) hypertension: Secondary | ICD-10-CM | POA: Insufficient documentation

## 2015-01-09 DIAGNOSIS — Y998 Other external cause status: Secondary | ICD-10-CM | POA: Diagnosis not present

## 2015-01-09 DIAGNOSIS — Z79899 Other long term (current) drug therapy: Secondary | ICD-10-CM | POA: Insufficient documentation

## 2015-01-09 DIAGNOSIS — Z880 Allergy status to penicillin: Secondary | ICD-10-CM | POA: Diagnosis not present

## 2015-01-09 DIAGNOSIS — S2231XA Fracture of one rib, right side, initial encounter for closed fracture: Secondary | ICD-10-CM | POA: Diagnosis not present

## 2015-01-09 DIAGNOSIS — S3991XA Unspecified injury of abdomen, initial encounter: Secondary | ICD-10-CM | POA: Insufficient documentation

## 2015-01-09 DIAGNOSIS — S29001A Unspecified injury of muscle and tendon of front wall of thorax, initial encounter: Secondary | ICD-10-CM | POA: Diagnosis present

## 2015-01-09 DIAGNOSIS — Z7952 Long term (current) use of systemic steroids: Secondary | ICD-10-CM | POA: Insufficient documentation

## 2015-01-09 DIAGNOSIS — R109 Unspecified abdominal pain: Secondary | ICD-10-CM | POA: Diagnosis not present

## 2015-01-09 DIAGNOSIS — Y9389 Activity, other specified: Secondary | ICD-10-CM | POA: Insufficient documentation

## 2015-01-09 DIAGNOSIS — W19XXXA Unspecified fall, initial encounter: Secondary | ICD-10-CM

## 2015-01-09 MED ORDER — OXYCODONE-ACETAMINOPHEN 5-325 MG PO TABS: 1.0000 | ORAL_TABLET | Freq: Four times a day (QID) | ORAL | Status: DC | PRN

## 2015-01-09 NOTE — ED Notes (Signed)
Patient transported to X-ray 

## 2015-01-09 NOTE — ED Notes (Signed)
Pt ambulatory to triage with reports of falling Sunday for unknown reason and since then has been having worsening pain to rt side of rib cage and around to her back. Pt denies sob or dizziness.

## 2015-01-09 NOTE — Discharge Instructions (Signed)
Please take your pain medication as needed, as prescribed. Please use your incentive spirometer twice an hour while awake. Please follow-up with your primary care physician in the next 2-3 days for recheck. Return to the emergency department for any worsening pain, or any trouble breathing.   Rib Fracture A rib fracture is a break or crack in one of the bones of the ribs. The ribs are like a cage that goes around your upper chest. A broken or cracked rib is often painful, but most do not cause other problems. Most rib fractures heal on their own in 1-3 months. HOME CARE  Avoid activities that cause pain to the injured area. Protect your injured area.  Slowly increase activity as told by your doctor.  Take medicine as told by your doctor.  Put ice on the injured area for the first 1-2 days after you have been treated or as told by your doctor.  Put ice in a plastic bag.  Place a towel between your skin and the bag.  Leave the ice on for 15-20 minutes at a time, every 2 hours while you are awake.  Do deep breathing as told by your doctor. You may be told to:  Take deep breaths many times a day.  Cough many times a day while hugging a pillow.  Use a device (incentive spirometer) to perform deep breathing many times a day.  Drink enough fluids to keep your pee (urine) clear or pale yellow.   Do not wear a rib belt or binder. These do not allow you to breathe deeply. GET HELP RIGHT AWAY IF:   You have a fever.  You have trouble breathing.   You cannot stop coughing.  You cough up thick or bloody spit (mucus).   You feel sick to your stomach (nauseous), throw up (vomit), or have belly (abdominal) pain.   Your pain gets worse and medicine does not help.  MAKE SURE YOU:   Understand these instructions.  Will watch your condition.  Will get help right away if you are not doing well or get worse.   This information is not intended to replace advice given to you by your  health care provider. Make sure you discuss any questions you have with your health care provider.   Document Released: 10/08/2007 Document Revised: 04/25/2012 Document Reviewed: 03/02/2012 Elsevier Interactive Patient Education [employer]2016 [employer] Inc.

## 2015-01-09 NOTE — ED Provider Notes (Signed)
[facility] Emergency Department Provider Note  Time seen: 9:57 AM  I have reviewed the triage vital signs and the nursing notes.   HISTORY  Chief Complaint Fall    HPI Kirsten Riley is a 81 y.o. female with a past medical history of hypertension, CAD, polymyalgia rheumatica, hyperlipidemia, hypothyroidism, diabetes who presents to the emergency department for right-sided pain following a fall. According to the patient she fell 3 days ago on Christmas day. States she was attempting to move a chair and she fell landing her right side on another chair. States since that time she's had considerable pain to her right chest and right abdomen. Denies hitting her head, denies loss of consciousness. Does not take any blood thinners. Describes her pain as moderate, severe. Press on the chest or upper abdomen. Patient states a history of polymyalgia rheumatica, and states he abdominal pain is largely unchanged for the past 8 months however the chest pain is new.    Past Medical History  Diagnosis Date  . Hypertension   . CAD (coronary artery disease)   . Polymyalgia rheumatica (HCC) 06/08/2014  . Hyperlipidemia   . Hypothyroidism   . Diabetes mellitus (HCC) 12/10/2014    Patient Active Problem List   Diagnosis Date Noted  . Diabetes mellitus (HCC) 12/10/2014  . Essential hypertension, benign 06/08/2014  . Hyperlipidemia 06/08/2014  . Polymyalgia rheumatica (HCC) 06/08/2014  . Hypothyroidism 06/08/2014    Past Surgical History  Procedure Laterality Date  . Abdominal hysterectomy  1978    partial  . Hypercholesterol    . Hypothyroid    . Coronary artery bypass graft      Current Outpatient Rx  Name  Route  Sig  Dispense  Refill  . calcium-vitamin D 250-100 MG-UNIT per tablet   Oral   Take 1 tablet by mouth 2 (two) times daily.         Marland Kitchen. glipiZIDE (GLUCOTROL XL) 5 MG 24 hr tablet   Oral   Take 1 tablet (5 mg total) by mouth daily with breakfast.   30  tablet   3   . glucosamine-chondroitin 500-400 MG tablet   Oral   Take 1 tablet by mouth daily.         Marland Kitchen. levothyroxine (SYNTHROID, LEVOTHROID) 75 MCG tablet   Oral   Take 75 mcg by mouth daily before breakfast.         . lovastatin (MEVACOR) 40 MG tablet   Oral   Take 1 tablet (40 mg total) by mouth at bedtime.   90 tablet   3   . Multiple Vitamin (MULTIVITAMIN WITH MINERALS) TABS tablet   Oral   Take 1 tablet by mouth daily.         . predniSONE (DELTASONE) 5 MG tablet   Oral   Take 1 tablet (5 mg total) by mouth 2 (two) times daily with a meal.   60 tablet   1     Allergies Penicillins and Sulfa antibiotics  Family History  Problem Relation Age of Onset  . Heart disease Mother   . Stroke Mother   . Hypertension Mother   . Cancer Father   . Cancer Sister     breast, ovarian, pancreatic  . Heart disease Sister     x2  . Cancer Brother   . Heart disease Brother   . Cancer Brother   . Heart disease Brother   . Cancer Brother   . Heart disease Brother  Social History Social History  Substance Use Topics  . Smoking status: Never Smoker   . Smokeless tobacco: Never Used  . Alcohol Use: No    Review of Systems Constitutional: Negative for fever. Cardiovascular: Positive for right-sided chest pain Respiratory: Negative for shortness of breath. Gastrointestinal: Positive for right-sided abdominal pain. Genitourinary: Negative for dysuria. Musculoskeletal: Negative for back pain. Negative for neck pain. Neurological: Negative for headaches, focal weakness or numbness. 10-point ROS otherwise negative.  ____________________________________________   PHYSICAL EXAM:  VITAL SIGNS: ED Triage Vitals  Enc Vitals Group     BP 01/09/15 0928 144/77 mmHg     Pulse Rate 01/09/15 0928 94     Resp 01/09/15 0928 18     Temp 01/09/15 0928 97.6 F (36.4 C)     Temp Source 01/09/15 0928 Oral     SpO2 01/09/15 0928 98 %     Weight 01/09/15 0928 115 lb  (52.164 kg)     Height 01/09/15 0928  (1.549 m)     Head Cir --      Peak Flow --      Pain Score 01/09/15 0928 7     Pain Loc --      Pain Edu? --      Excl. in GC? --     Constitutional: Alert and oriented. Well appearing and in no distress. Eyes: Normal exam ENT   Head: Normocephalic and atraumatic.   Mouth/Throat: Mucous membranes are moist. Cardiovascular: Normal rate, regular rhythm. No murmur Respiratory: Normal respiratory effort without tachypnea nor retractions. Breath sounds are clear and equal bilaterally. No wheezes/rales/rhonchi. Moderate right lateral chest wall tenderness palpation. Gastrointestinal: Soft, moderate right flank tenderness palpation. No rebound or guarding. No distention. Musculoskeletal: Nontender with normal range of motion in all extremities. No cervical spine tenderness. Neurologic:  Normal speech and language. No gross focal neurologic deficits Skin:  Skin is warm, dry and intact. No ecchymosis noted Psychiatric: Mood and affect are normal. Speech and behavior are normal.  ____________________________________________   RADIOLOGY  CT shows right seventh rib fracture.  ____________________________________________   INITIAL IMPRESSION / ASSESSMENT AND PLAN / ED COURSE  Pertinent labs & imaging results that were available during my care of the patient were reviewed by me and considered in my medical decision making (see chart for details).  Patient with moderate right flank and right chest tenderness palpation. States right flank pain is ongoing for 8 months or the right chest pain is new. We will obtain CT scans without contrast of the chest and abdomen and to help further evaluate. Patient is agreeable to plan, no distress, no discomfort at rest, however moderate discomfort with palpation or movement.  CT shows nondisplaced right seventh rib fracture, otherwise within normal limits. We'll discharge home with Percocet for pain  control, and an incentive spirometer. Patient to follow-up with her primary care doctor.  ____________________________________________   FINAL CLINICAL IMPRESSION(S) / ED DIAGNOSES  Right chest wall pain Right abdominal pain Right rib fracture   Minna Antis, MD 01/09/15 1120

## 2015-01-09 NOTE — ED Notes (Signed)
Pt remains to have pain in right ribs. Does not want a pain pill and took tylenol at home

## 2015-01-09 NOTE — ED Notes (Signed)
Waiting on CT results

## 2015-02-26 ENCOUNTER — Telehealth: Payer: Self-pay

## 2015-02-26 MED ORDER — GLIPIZIDE ER 5 MG PO TB24: 5.0000 mg | ORAL_TABLET | Freq: Every day | ORAL | Status: DC

## 2015-02-26 NOTE — Telephone Encounter (Signed)
Pharmacy sent a fax requesting a 90 day supply for patient's glipizide. Pharmacy is [facility].

## 2015-02-27 DIAGNOSIS — M0600 Rheumatoid arthritis without rheumatoid factor, unspecified site: Secondary | ICD-10-CM | POA: Insufficient documentation

## 2015-02-27 DIAGNOSIS — M0609 Rheumatoid arthritis without rheumatoid factor, multiple sites: Secondary | ICD-10-CM | POA: Insufficient documentation

## 2015-03-12 ENCOUNTER — Encounter: Payer: Self-pay | Admitting: Unknown Physician Specialty

## 2015-03-12 ENCOUNTER — Ambulatory Visit (INDEPENDENT_AMBULATORY_CARE_PROVIDER_SITE_OTHER): Payer: Medicare Other | Admitting: Unknown Physician Specialty

## 2015-03-12 VITALS — BP 111/66 | HR 69 | Temp 98.0°F | Ht 58.8 in | Wt 112.2 lb

## 2015-03-12 DIAGNOSIS — E139 Other specified diabetes mellitus without complications: Secondary | ICD-10-CM | POA: Diagnosis not present

## 2015-03-12 LAB — BAYER DCA HB A1C WAIVED: HB A1C (BAYER DCA - WAIVED): 6.4 % (ref <7.0)

## 2015-03-12 MED ORDER — GLIPIZIDE ER 2.5 MG PO TB24: 2.5000 mg | ORAL_TABLET | Freq: Every day | ORAL | Status: DC

## 2015-03-12 NOTE — Progress Notes (Signed)
BP 111/66 mmHg  Pulse 69  Temp(Src) 98 F (36.7 C)  Ht 4' 10.8" (1.494 m)  Wt 112 lb 3.2 oz (50.894 kg)  BMI 22.80 kg/m2  SpO2 98%  LMP  (LMP Unknown)   Subjective:    Patient ID: Kirsten Riley, female    DOB: Sep 01, 1931, 82 y.o.   MRN: [id number]  HPI: Kirsten Riley is a 82 y.o. female  Chief Complaint  Patient presents with  . Diabetes  . Hyperlipidemia  . Hypertension   Diabetes Thought to be related to Prednisone use from polymyalgia rheumatica. She does not check her blood sugar at home.  She is currently on Glipizide daily.  The rheumatologist is slowly weaning her off Prednisone and has started Methotrexate  Hypertension Using medications without difficulty Average home BPs Not checking   No problems or lightheadedness No chest pain with exertion or shortness of breath No Edema   Hyperlipidemia Using medications without problems: No Muscle aches: yes due to PMR Exercise: [employer] Christmas and hurt her back.  Whe is planning to return to the gym    Relevant past medical, surgical, family and social history reviewed and updated as indicated. Interim medical history since our last visit reviewed. Allergies and medications reviewed and updated.  Review of Systems  Per HPI unless specifically indicated above     Objective:    BP 111/66 mmHg  Pulse 69  Temp(Src) 98 F (36.7 C)  Ht 4' 10.8" (1.494 m)  Wt 112 lb 3.2 oz (50.894 kg)  BMI 22.80 kg/m2  SpO2 98%  LMP  (LMP Unknown)  Wt Readings from Last 3 Encounters:  03/12/15 112 lb 3.2 oz (50.894 kg)  01/09/15 115 lb (52.164 kg)  12/10/14 117 lb 6.4 oz (53.252 kg)    Physical Exam  Constitutional: She is oriented to person, place, and time. She appears well-developed and well-nourished. No distress.  HENT:  Head: Normocephalic and atraumatic.  Eyes: Conjunctivae and lids are normal. Right eye exhibits no discharge. Left eye exhibits no discharge. No scleral icterus.  Neck: Normal range of motion. Neck  supple. No JVD present. Carotid bruit is not present.  Cardiovascular: Normal rate, regular rhythm and normal heart sounds.   Pulmonary/Chest: Effort normal and breath sounds normal.  Abdominal: Normal appearance. There is no splenomegaly or hepatomegaly.  Musculoskeletal: Normal range of motion.  Neurological: She is alert and oriented to person, place, and time.  Skin: Skin is warm, dry and intact. No rash noted. No pallor.  Psychiatric: She has a normal mood and affect. Her behavior is normal. Judgment and thought content normal.    Results for orders placed or performed in visit on 12/10/14  Lipid Panel Piccolo, [employer]  Result Value Ref Range   Cholesterol Piccolo, Waived 117 <200 mg/dL   HDL Chol Piccolo, Waived 37 (L) >59 mg/dL   Triglycerides Piccolo,Waived 114 <150 mg/dL   Chol/HDL Ratio Piccolo,Waive 3.1 mg/dL   LDL Chol Calc Piccolo Waived 57 <100 mg/dL   VLDL Chol Calc Piccolo,Waive 23 <30 mg/dL  Comprehensive metabolic panel  Result Value Ref Range   Glucose 109 (H) 65 - 99 mg/dL   BUN 8 8 - 27 mg/dL   Creatinine, Ser 4.09 0.57 - 1.00 mg/dL   GFR calc non Af Amer 82 >59 mL/min/1.73   GFR calc Af Amer 94 >59 mL/min/1.73   BUN/Creatinine Ratio 12 11 - 26   Sodium 141 136 - 144 mmol/L   Potassium 4.3 3.5 - 5.2 mmol/L  Chloride 104 97 - 106 mmol/L   CO2 22 18 - 29 mmol/L   Calcium 10.0 8.7 - 10.3 mg/dL   Total Protein 6.1 6.0 - 8.5 g/dL   Albumin 3.9 3.5 - 4.7 g/dL   Globulin, Total 2.2 1.5 - 4.5 g/dL   Albumin/Globulin Ratio 1.8 1.1 - 2.5   Bilirubin Total 0.6 0.0 - 1.2 mg/dL   Alkaline Phosphatase 96 39 - 117 IU/L   AST 30 0 - 40 IU/L   ALT 29 0 - 32 IU/L  Glucose Hemocue Waived  Result Value Ref Range   Glu Hemocue Waived 120 (H) 65 - 99 mg/dL  Vitamin Z61  Result Value Ref Range   Vitamin B-12 1104 (H) 211 - 946 pg/mL      Assessment & Plan:   Problem List Items Addressed This Visit    None    Visit Diagnoses    Secondary diabetes (HCC)    -  Primary     Relevant Medications    glipiZIDE (GLUCOTROL XL) 2.5 MG 24 hr tablet    Other Relevant Orders    Bayer DCA Hb A1c Waived    Comprehensive metabolic panel       Decrease Glucotrol to 2.5 mg.  Recheck in 1 month and consider stopping.    Follow up plan: Return in about 4 weeks (around 04/09/2015).

## 2015-03-12 NOTE — Assessment & Plan Note (Signed)
Hgb A1C is 6.4.  Decrease Glucotrol to 2.5 mg.

## 2015-03-13 LAB — COMPREHENSIVE METABOLIC PANEL
ALBUMIN: 4.4 g/dL (ref 3.5–4.7)
ALT: 19 IU/L (ref 0–32)
AST: 22 IU/L (ref 0–40)
Albumin/Globulin Ratio: 2.2 (ref 1.1–2.5)
Alkaline Phosphatase: 142 IU/L — ABNORMAL HIGH (ref 39–117)
BUN / CREAT RATIO: 15 (ref 11–26)
BUN: 11 mg/dL (ref 8–27)
Bilirubin Total: 0.8 mg/dL (ref 0.0–1.2)
CALCIUM: 10.1 mg/dL (ref 8.7–10.3)
CO2: 22 mmol/L (ref 18–29)
Chloride: 103 mmol/L (ref 96–106)
Creatinine, Ser: 0.75 mg/dL (ref 0.57–1.00)
GFR, EST AFRICAN AMERICAN: 86 mL/min/{1.73_m2} (ref >59)
GFR, EST NON AFRICAN AMERICAN: 75 mL/min/{1.73_m2} (ref >59)
GLOBULIN, TOTAL: 2 g/dL (ref 1.5–4.5)
Glucose: 104 mg/dL — ABNORMAL HIGH (ref 65–99)
POTASSIUM: 4.6 mmol/L (ref 3.5–5.2)
SODIUM: 143 mmol/L (ref 134–144)
Total Protein: 6.4 g/dL (ref 6.0–8.5)

## 2015-04-09 ENCOUNTER — Ambulatory Visit (INDEPENDENT_AMBULATORY_CARE_PROVIDER_SITE_OTHER): Payer: Medicare Other | Admitting: Unknown Physician Specialty

## 2015-04-09 ENCOUNTER — Encounter: Payer: Self-pay | Admitting: Unknown Physician Specialty

## 2015-04-09 VITALS — BP 113/70 | HR 66 | Temp 97.9°F | Ht 58.2 in | Wt 111.2 lb

## 2015-04-09 DIAGNOSIS — M545 Low back pain, unspecified: Secondary | ICD-10-CM | POA: Insufficient documentation

## 2015-04-09 DIAGNOSIS — Z5181 Encounter for therapeutic drug level monitoring: Secondary | ICD-10-CM | POA: Diagnosis not present

## 2015-04-09 DIAGNOSIS — E099 Drug or chemical induced diabetes mellitus without complications: Secondary | ICD-10-CM | POA: Diagnosis not present

## 2015-04-09 LAB — GLUCOSE HEMOCUE WAIVED: Glu Hemocue Waived: 119 mg/dL — ABNORMAL HIGH (ref 65–99)

## 2015-04-09 NOTE — Progress Notes (Signed)
BP 113/70 mmHg  Pulse 66  Temp(Src) 97.9 F (36.6 C)  Ht 4' 10.2" (1.478 m)  Wt 111 lb 3.2 oz (50.44 kg)  BMI 23.09 kg/m2  SpO2 98%  LMP  (LMP Unknown)   Subjective:    Patient ID: Kirsten Riley, female    DOB: 03/07/1931, 82 y.o.   MRN: [id number]  HPI: Kirsten FlightJoann H Vayda is a [age] y.o. female  Chief Complaint  Patient presents with  . Diabetes   Pt presents for follow-up on secondary diabetes related to prednisone use. Pt states that she is currently taking Prednisone 2.5 mg and scheduled to see Rheumatologist tomorrow. Per pt, her Rheumatologist is to discontinue her prednisone. Pt is taking her Glipizide 2.5 mg daily for diabetes. Pt denies complications with medications.   Diabetes:  Using medications without difficulties No hypoglycemic episodes. No hyperglycemic episodes. Feet problems: denies  Blood Sugars averaging: does not check her glucose at home    Back Injury: Pt reports past back injury from a fall that occurred during christmas. Per pt states she has had episodic minor pain with certain movements. Pt is requesting a back brace to help with movement and prevent pain. Pt states that she has stiffness that occurs upon waking and uses Acetaminophen throughout the day. Pt states that she takes "two pills every 6 hours". Pt states that the pills are extra strength. Pt states that she is doing better than prior with the acute injury in December. However, states that she still needs something to help her through the day.  Relevant past medical, surgical, family and social history reviewed and updated as indicated. Interim medical history since our last visit reviewed. Allergies and medications reviewed and updated.  Review of Systems  Constitutional: Negative for activity change, appetite change, fatigue and unexpected weight change.  HENT: Negative.   Eyes: Negative.  Negative for visual disturbance.  Respiratory: Negative.   Cardiovascular: Negative.   Endocrine: Negative.    Genitourinary: Negative.   Musculoskeletal: Positive for back pain (chronic back pain from previous injury in December ). Negative for myalgias.  Skin: Negative.   Neurological: Negative.  Negative for dizziness, weakness and light-headedness.    Per HPI unless specifically indicated above     Objective:    BP 113/70 mmHg  Pulse 66  Temp(Src) 97.9 F (36.6 C)  Ht 4' 10.2" (1.478 m)  Wt 111 lb 3.2 oz (50.44 kg)  BMI 23.09 kg/m2  SpO2 98%  LMP  (LMP Unknown)  Wt Readings from Last 3 Encounters:  04/09/15 111 lb 3.2 oz (50.44 kg)  03/12/15 112 lb 3.2 oz (50.894 kg)  01/09/15 115 lb (52.164 kg)    Physical Exam  Constitutional: She is oriented to person, place, and time. She appears well-developed and well-nourished. No distress.  HENT:  Head: Normocephalic and atraumatic.  Eyes: Conjunctivae and EOM are normal. Pupils are equal, round, and reactive to light.  Neck: Normal range of motion. Neck supple.  Cardiovascular: Normal rate and regular rhythm.   Pulmonary/Chest: Effort normal and breath sounds normal. No respiratory distress. She exhibits no tenderness.  Abdominal: Soft. Bowel sounds are normal. There is no tenderness.  Musculoskeletal: Normal range of motion. She exhibits no edema or tenderness.  Neurological: She is alert and oriented to person, place, and time.  Skin: Skin is warm and dry.  Psychiatric: She has a normal mood and affect. Her behavior is normal. Judgment and thought content normal.  Nursing note and vitals reviewed.  Results for orders placed or performed in visit on 04/09/15  Glucose Hemocue Waived  Result Value Ref Range   Glu Hemocue Waived 119 (H) 65 - 99 mg/dL      Assessment & Plan:   Problem List Items Addressed This Visit      Unprioritized   Diabetes mellitus (HCC) - Primary    Pt taking Glipizide related to prednisone induced hyperglycemia. Pt is taking medictaion without difficulty; denies symptoms related to hypoglycemia. Last  HbgA1c was 6.4. Collected fasting glucose, noted 119 mg/dL. Pt told to discontinue Glipizide. Follow-up in 1 month       Relevant Orders   Glucose Hemocue Waived (Completed)   Medication monitoring encounter    Pt reports taking two extra strength tylenol tablets, daily every 6 hours for back discomfort. CMP ordered to review risk for organ damage related to chronic tylenol use. Discussed the daily recommended amount of tylenol and other adjunctive therapies that could be used to alleviate back pain.       Relevant Orders   Comprehensive metabolic panel   Low back pain    Pt reports  back pain with daily use of Tylenol. Pt offered physical therapy.       Relevant Orders   Ambulatory referral to Physical Therapy       Follow up plan: Return in about 1 month (around 05/10/2015).

## 2015-04-09 NOTE — Patient Instructions (Signed)

## 2015-04-09 NOTE — Assessment & Plan Note (Addendum)
Pt reports  back pain with daily use of Tylenol. Pt offered physical therapy.

## 2015-04-09 NOTE — Assessment & Plan Note (Signed)
Pt reports taking two extra strength tylenol tablets, daily every 6 hours for back discomfort. CMP ordered to review risk for organ damage related to chronic tylenol use. Discussed the daily recommended amount of tylenol and other adjunctive therapies that could be used to alleviate back pain.

## 2015-04-09 NOTE — Assessment & Plan Note (Addendum)
Pt taking Glipizide related to prednisone induced hyperglycemia. Pt is taking medictaion without difficulty; denies symptoms related to hypoglycemia. Last HbgA1c was 6.4. Collected fasting glucose, noted 119 mg/dL. Pt told to discontinue Glipizide. Follow-up in 1 month

## 2015-04-10 LAB — COMPREHENSIVE METABOLIC PANEL
ALBUMIN: 4.2 g/dL (ref 3.5–4.7)
ALT: 19 IU/L (ref 0–32)
AST: 21 IU/L (ref 0–40)
Albumin/Globulin Ratio: 1.9 (ref 1.2–2.2)
Alkaline Phosphatase: 130 IU/L — ABNORMAL HIGH (ref 39–117)
BUN / CREAT RATIO: 25 (ref 11–26)
BUN: 17 mg/dL (ref 8–27)
Bilirubin Total: 0.5 mg/dL (ref 0.0–1.2)
CO2: 19 mmol/L (ref 18–29)
CREATININE: 0.69 mg/dL (ref 0.57–1.00)
Calcium: 10.2 mg/dL (ref 8.7–10.3)
Chloride: 101 mmol/L (ref 96–106)
GFR, EST AFRICAN AMERICAN: 94 mL/min/{1.73_m2} (ref >59)
GFR, EST NON AFRICAN AMERICAN: 82 mL/min/{1.73_m2} (ref >59)
GLOBULIN, TOTAL: 2.2 g/dL (ref 1.5–4.5)
GLUCOSE: 104 mg/dL — AB (ref 65–99)
Potassium: 4.2 mmol/L (ref 3.5–5.2)
SODIUM: 140 mmol/L (ref 134–144)
TOTAL PROTEIN: 6.4 g/dL (ref 6.0–8.5)

## 2015-05-13 ENCOUNTER — Ambulatory Visit (INDEPENDENT_AMBULATORY_CARE_PROVIDER_SITE_OTHER): Payer: Medicare Other | Admitting: Unknown Physician Specialty

## 2015-05-13 ENCOUNTER — Encounter: Payer: Self-pay | Admitting: Unknown Physician Specialty

## 2015-05-13 VITALS — BP 124/70 | HR 62 | Temp 97.3°F | Ht 59.5 in | Wt 114.8 lb

## 2015-05-13 DIAGNOSIS — E099 Drug or chemical induced diabetes mellitus without complications: Secondary | ICD-10-CM

## 2015-05-13 LAB — GLUCOSE HEMOCUE WAIVED: Glu Hemocue Waived: 164 mg/dL — ABNORMAL HIGH (ref 65–99)

## 2015-05-13 MED ORDER — GLIPIZIDE ER 2.5 MG PO TB24: 2.5000 mg | ORAL_TABLET | Freq: Every day | ORAL | Status: DC

## 2015-05-13 NOTE — Assessment & Plan Note (Signed)
Blood sugar is 164 this AM fasting.  Restart Glipizide in the AM.

## 2015-05-13 NOTE — Progress Notes (Signed)
BP 124/70 mmHg  Pulse 62  Temp(Src) 97.3 F (36.3 C)  Ht 4' 11.5" (1.511 m)  Wt 114 lb 12.8 oz (52.073 kg)  BMI 22.81 kg/m2  SpO2 98%  LMP  (LMP Unknown)   Subjective:    Patient ID: Kirsten Riley, female    DOB: 11/22/1931, 82 y.o.   MRN: [id number]  HPI: Kirsten Riley is a 82 y.o. female  Chief Complaint  Patient presents with  . Diabetes    1 month f/u   Back pain Better with back brace.  Never did go to PT.  Following pain in other joints with [facility]. [facility]  Diabetes Off her Prednisone and Glipiziede.  Denies abnormal thirst or increased urination.  "Everything I eat is sugar free."    Relevant past medical, surgical, family and social history reviewed and updated as indicated. Interim medical history since our last visit reviewed. Allergies and medications reviewed and updated.  Review of Systems  Per HPI unless specifically indicated above     Objective:    BP 124/70 mmHg  Pulse 62  Temp(Src) 97.3 F (36.3 C)  Ht 4' 11.5" (1.511 m)  Wt 114 lb 12.8 oz (52.073 kg)  BMI 22.81 kg/m2  SpO2 98%  LMP  (LMP Unknown)  Wt Readings from Last 3 Encounters:  05/13/15 114 lb 12.8 oz (52.073 kg)  04/09/15 111 lb 3.2 oz (50.44 kg)  03/12/15 112 lb 3.2 oz (50.894 kg)    Physical Exam  Constitutional: She is oriented to person, place, and time. She appears well-developed and well-nourished. No distress.  HENT:  Head: Normocephalic and atraumatic.  Eyes: Conjunctivae and lids are normal. Right eye exhibits no discharge. Left eye exhibits no discharge. No scleral icterus.  Cardiovascular: Normal rate.   Pulmonary/Chest: Effort normal.  Abdominal: Normal appearance. There is no splenomegaly or hepatomegaly.  Musculoskeletal: Normal range of motion.  Neurological: She is alert and oriented to person, place, and time.  Skin: Skin is intact. No rash noted. No pallor.  Psychiatric: She has a normal mood and affect. Her behavior is normal. Judgment and thought content normal.    Glucose is 164  Results for orders placed or performed in visit on 04/09/15  Glucose Hemocue Waived  Result Value Ref Range   Glu Hemocue Waived 119 (H) 65 - 99 mg/dL  Comprehensive metabolic panel  Result Value Ref Range   Glucose 104 (H) 65 - 99 mg/dL   BUN 17 8 - 27 mg/dL   Creatinine, Ser 6.570.69 0.57 - 1.00 mg/dL   GFR calc non Af Amer 82 >59 mL/min/1.73   GFR calc Af Amer 94 >59 mL/min/1.73   BUN/Creatinine Ratio 25 11 - 26   Sodium 140 134 - 144 mmol/L   Potassium 4.2 3.5 - 5.2 mmol/L   Chloride 101 96 - 106 mmol/L   CO2 19 18 - 29 mmol/L   Calcium 10.2 8.7 - 10.3 mg/dL   Total Protein 6.4 6.0 - 8.5 g/dL   Albumin 4.2 3.5 - 4.7 g/dL   Globulin, Total 2.2 1.5 - 4.5 g/dL   Albumin/Globulin Ratio 1.9 1.2 - 2.2   Bilirubin Total 0.5 0.0 - 1.2 mg/dL   Alkaline Phosphatase 130 (H) 39 - 117 IU/L   AST 21 0 - 40 IU/L   ALT 19 0 - 32 IU/L      Assessment & Plan:   Problem List Items Addressed This Visit      Unprioritized   Diabetes mellitus (HCC) -  Primary    Blood sugar is 164 this AM fasting.  Restart Glipizide in the AM.        Relevant Medications   glipiZIDE (GLUCOTROL XL) 2.5 MG 24 hr tablet   Other Relevant Orders   Glucose Hemocue Waived       Follow up plan: Return in about 2 months (around 07/13/2015).

## 2015-07-17 ENCOUNTER — Encounter: Payer: Self-pay | Admitting: Unknown Physician Specialty

## 2015-07-17 ENCOUNTER — Ambulatory Visit (INDEPENDENT_AMBULATORY_CARE_PROVIDER_SITE_OTHER): Payer: Medicare Other | Admitting: Unknown Physician Specialty

## 2015-07-17 VITALS — BP 112/62 | HR 68 | Temp 97.9°F | Ht 59.7 in | Wt 112.0 lb

## 2015-07-17 DIAGNOSIS — E099 Drug or chemical induced diabetes mellitus without complications: Secondary | ICD-10-CM

## 2015-07-17 LAB — BAYER DCA HB A1C WAIVED: HB A1C: 6.1 % (ref <7.0)

## 2015-07-17 NOTE — Progress Notes (Signed)
   BP 112/62 mmHg  Pulse 68  Temp(Src) 97.9 F (36.6 C)  Ht 4' 11.7" (1.516 m)  Wt 112 lb (50.803 kg)  BMI 22.11 kg/m2  SpO2 97%  LMP  (LMP Unknown)   Subjective:    Patient ID: Kirsten Riley, female    DOB: 08/10/1931, 82 y.o.   MRN: [id number]  HPI: Kirsten Riley is a 82 y.o. female  Chief Complaint  Patient presents with  . Diabetes    pt states last eye exam in chart is correct   . Hypertension  . Hypothyroidism   Pt presents for follow-up on secondary diabetes related to prednisone use. Pt states that she is off her Prednisone and taking Methotrexate. Pt is taking her Glipizide 2.5 mg daily for diabetes. Pt denies complications with medications.     Relevant past medical, surgical, family and social history reviewed and updated as indicated. Interim medical history since our last visit reviewed. Allergies and medications reviewed and updated.  Review of Systems  Per HPI unless specifically indicated above     Objective:    BP 112/62 mmHg  Pulse 68  Temp(Src) 97.9 F (36.6 C)  Ht 4' 11.7" (1.516 m)  Wt 112 lb (50.803 kg)  BMI 22.11 kg/m2  SpO2 97%  LMP  (LMP Unknown)  Wt Readings from Last 3 Encounters:  07/17/15 112 lb (50.803 kg)  05/13/15 114 lb 12.8 oz (52.073 kg)  04/09/15 111 lb 3.2 oz (50.44 kg)    Physical Exam  Constitutional: She is oriented to person, place, and time. She appears well-developed and well-nourished. No distress.  HENT:  Head: Normocephalic and atraumatic.  Eyes: Conjunctivae and lids are normal. Right eye exhibits no discharge. Left eye exhibits no discharge. No scleral icterus.  Neck: Normal range of motion. Neck supple. No JVD present. Carotid bruit is not present.  Cardiovascular: Normal rate, regular rhythm and normal heart sounds.   Pulmonary/Chest: Effort normal and breath sounds normal.  Abdominal: Normal appearance. There is no splenomegaly or hepatomegaly.  Musculoskeletal: Normal range of motion.  Neurological: She is  alert and oriented to person, place, and time.  Skin: Skin is warm, dry and intact. No rash noted. No pallor.  Psychiatric: She has a normal mood and affect. Her behavior is normal. Judgment and thought content normal.    Results for orders placed or performed in visit on 05/13/15  Glucose Hemocue Waived  Result Value Ref Range   Glu Hemocue Waived 164 (H) 65 - 99 mg/dL      Assessment & Plan:   Problem List Items Addressed This Visit      Unprioritized   Diabetes mellitus (HCC) - Primary    Hgb A1C is 6.1.  Stop Glipizide due to excellent control and off Prednisone.  Will recheck in 3 months      Relevant Orders   Bayer DCA Hb A1c Waived       Follow up plan: Return in about 3 months (around 10/17/2015).

## 2015-07-17 NOTE — Assessment & Plan Note (Signed)
Hgb A1C is 6.1.  Stop Glipizide due to excellent control and off Prednisone.  Will recheck in 3 months

## 2015-07-31 ENCOUNTER — Other Ambulatory Visit: Payer: Self-pay

## 2015-07-31 MED ORDER — LOVASTATIN 40 MG PO TABS: 40.0000 mg | ORAL_TABLET | Freq: Every day | ORAL | Status: DC

## 2015-08-16 ENCOUNTER — Other Ambulatory Visit: Payer: Self-pay

## 2015-08-16 ENCOUNTER — Telehealth: Payer: Self-pay | Admitting: Unknown Physician Specialty

## 2015-08-16 MED ORDER — LEVOTHYROXINE SODIUM 75 MCG PO TABS: 75.0000 ug | ORAL_TABLET | Freq: Every day | ORAL | 2 refills | Status: DC

## 2015-08-16 NOTE — Telephone Encounter (Signed)
Routing to provider  

## 2015-08-16 NOTE — Telephone Encounter (Signed)
[place] pharmacy would like to get a 90 day supply of levothyroxine (SYNTHROID, LEVOTHROID) 75 MCG tablet.

## 2015-10-18 ENCOUNTER — Other Ambulatory Visit: Payer: Self-pay

## 2015-10-18 ENCOUNTER — Encounter: Payer: Self-pay | Admitting: Unknown Physician Specialty

## 2015-10-18 ENCOUNTER — Ambulatory Visit (INDEPENDENT_AMBULATORY_CARE_PROVIDER_SITE_OTHER): Payer: Medicare Other | Admitting: Unknown Physician Specialty

## 2015-10-18 VITALS — BP 132/75 | HR 61 | Temp 98.0°F | Ht 59.9 in | Wt 108.8 lb

## 2015-10-18 DIAGNOSIS — E119 Type 2 diabetes mellitus without complications: Secondary | ICD-10-CM | POA: Diagnosis not present

## 2015-10-18 DIAGNOSIS — R7301 Impaired fasting glucose: Secondary | ICD-10-CM | POA: Diagnosis not present

## 2015-10-18 DIAGNOSIS — Z23 Encounter for immunization: Secondary | ICD-10-CM | POA: Diagnosis not present

## 2015-10-18 LAB — BAYER DCA HB A1C WAIVED: HB A1C (BAYER DCA - WAIVED): 6.1 % (ref <7.0)

## 2015-10-18 LAB — HEMOGLOBIN A1C

## 2015-10-18 MED ORDER — LOVASTATIN 40 MG PO TABS: 40.0000 mg | ORAL_TABLET | Freq: Every day | ORAL | 3 refills | Status: DC

## 2015-10-18 NOTE — Patient Instructions (Addendum)
Tdap Vaccine (Tetanus, Diphtheria and Pertussis): What You Need to Know 1. Why get vaccinated? Tetanus, diphtheria and pertussis are very serious diseases. Tdap vaccine can protect us from these diseases. And, Tdap vaccine given to pregnant women can protect newborn babies against pertussis. TETANUS (Lockjaw) is rare in the [place] today. It causes painful muscle tightening and stiffness, usually all over the body.  It can lead to tightening of muscles in the head and neck so you can't open your mouth, swallow, or sometimes even breathe. Tetanus kills about 1 out of 10 people who are infected even after receiving the best medical care. DIPHTHERIA is also rare in the [place] today. It can cause a thick coating to form in the back of the throat.  It can lead to breathing problems, heart failure, paralysis, and death. PERTUSSIS (Whooping Cough) causes severe coughing spells, which can cause difficulty breathing, vomiting and disturbed sleep.  It can also lead to weight loss, incontinence, and rib fractures. Up to 2 in 100 adolescents and 5 in 100 adults with pertussis are hospitalized or have complications, which could include pneumonia or death. These diseases are caused by bacteria. Diphtheria and pertussis are spread from person to person through secretions from coughing or sneezing. Tetanus enters the body through cuts, scratches, or wounds. Before vaccines, as many as 200,000 cases of diphtheria, 200,000 cases of pertussis, and hundreds of cases of tetanus, were reported in the [place] each year. Since vaccination began, reports of cases for tetanus and diphtheria have dropped by about 99% and for pertussis by about 80%. 2. Tdap vaccine Tdap vaccine can protect adolescents and adults from tetanus, diphtheria, and pertussis. One dose of Tdap is routinely given at age 11 or 12. People who did not get Tdap at that age should get it as soon as possible. Tdap is especially important  for healthcare professionals and anyone having close contact with a baby younger than 12 months. Pregnant women should get a dose of Tdap during every pregnancy, to protect the newborn from pertussis. Infants are most at risk for severe, life-threatening complications from pertussis. Another vaccine, called Td, protects against tetanus and diphtheria, but not pertussis. A Td booster should be given every 10 years. Tdap may be given as one of these boosters if you have never gotten Tdap before. Tdap may also be given after a severe cut or burn to prevent tetanus infection. Your doctor or the person giving you the vaccine can give you more information. Tdap may safely be given at the same time as other vaccines. 3. Some people should not get this vaccine  A person who has ever had a life-threatening allergic reaction after a previous dose of any diphtheria, tetanus or pertussis containing vaccine, OR has a severe allergy to any part of this vaccine, should not get Tdap vaccine. Tell the person giving the vaccine about any severe allergies.  Anyone who had coma or long repeated seizures within 7 days after a childhood dose of DTP or DTaP, or a previous dose of Tdap, should not get Tdap, unless a cause other than the vaccine was found. They can still get Td.  Talk to your doctor if you:  have seizures or another nervous system problem,  had severe pain or swelling after any vaccine containing diphtheria, tetanus or pertussis,  ever had a condition called Guillain-Barr Syndrome (GBS),  aren't feeling well on the day the shot is scheduled. 4. Risks With any medicine, including vaccines, there is   a chance of side effects. These are usually mild and go away on their own. Serious reactions are also possible but are rare. Most people who get Tdap vaccine do not have any problems with it. Mild problems following Tdap (Did not interfere with activities)  Pain where the shot was given (about 3 in 4  adolescents or 2 in 3 adults)  Redness or swelling where the shot was given (about 1 person in 5)  Mild fever of at least 100.4F (up to about 1 in 25 adolescents or 1 in 100 adults)  Headache (about 3 or 4 people in 10)  Tiredness (about 1 person in 3 or 4)  Nausea, vomiting, diarrhea, stomach ache (up to 1 in 4 adolescents or 1 in 10 adults)  Chills, sore joints (about 1 person in 10)  Body aches (about 1 person in 3 or 4)  Rash, swollen glands (uncommon) Moderate problems following Tdap (Interfered with activities, but did not require medical attention)  Pain where the shot was given (up to 1 in 5 or 6)  Redness or swelling where the shot was given (up to about 1 in 16 adolescents or 1 in 12 adults)  Fever over 102F (about 1 in 100 adolescents or 1 in 250 adults)  Headache (about 1 in 7 adolescents or 1 in 10 adults)  Nausea, vomiting, diarrhea, stomach ache (up to 1 or 3 people in 100)  Swelling of the entire arm where the shot was given (up to about 1 in 500). Severe problems following Tdap (Unable to perform usual activities; required medical attention)  Swelling, severe pain, bleeding and redness in the arm where the shot was given (rare). Problems that could happen after any vaccine:  People sometimes faint after a medical procedure, including vaccination. Sitting or lying down for about 15 minutes can help prevent fainting, and injuries caused by a fall. Tell your doctor if you feel dizzy, or have vision changes or ringing in the ears.  Some people get severe pain in the shoulder and have difficulty moving the arm where a shot was given. This happens very rarely.  Any medication can cause a severe allergic reaction. Such reactions from a vaccine are very rare, estimated at fewer than 1 in a million doses, and would happen within a few minutes to a few hours after the vaccination. As with any medicine, there is a very remote chance of a vaccine causing a serious  injury or death. The safety of vaccines is always being monitored. For more information, visit: www.cdc.gov/vaccinesafety/ 5. What if there is a serious problem? What should I look for?  Look for anything that concerns you, such as signs of a severe allergic reaction, very high fever, or unusual behavior.  Signs of a severe allergic reaction can include hives, swelling of the face and throat, difficulty breathing, a fast heartbeat, dizziness, and weakness. These would usually start a few minutes to a few hours after the vaccination. What should I do?  If you think it is a severe allergic reaction or other emergency that can't wait, call 9-1-1 or get the person to the nearest hospital. Otherwise, call your doctor.  Afterward, the reaction should be reported to the Vaccine Adverse Event Reporting System (VAERS). Your doctor might file this report, or you can do it yourself through the VAERS web site at www.vaers.hhs.gov, or by calling 1-800-822-7967. VAERS does not give medical advice.  6. The National Vaccine Injury Compensation Program The National Vaccine Injury Compensation Program (  VICP) is a federal program that was created to compensate people who may have been injured by certain vaccines. Persons who believe they may have been injured by a vaccine can learn about the program and about filing a claim by calling 1-800-338-2382 or visiting the VICP website at www.hrsa.gov/vaccinecompensation. There is a time limit to file a claim for compensation. 7. How can I learn more?  Ask your doctor. He or she can give you the vaccine package insert or suggest other sources of information.  Call your local or state health department.  Contact the Centers for Disease Control and Prevention (CDC):  Call 1-800-232-4636 (1-800-CDC-INFO) or  Visit CDC's website at www.cdc.gov/vaccines CDC Tdap Vaccine VIS (03/07/13)   This information is not intended to replace advice given to you by your health care  provider. Make sure you discuss any questions you have with your health care provider.   Document Released: 06/30/2011 Document Revised: 01/19/2014 Document Reviewed: 04/12/2013 Elsevier Interactive Patient Education 2016 Elsevier Inc.  

## 2015-10-18 NOTE — Progress Notes (Signed)
   BP 132/75 (BP Location: Left Arm, Patient Position: Sitting, Cuff Size: Small)   Pulse 61   Temp 98 F (36.7 C)   Ht 4' 11.9" (1.521 m)   Wt 108 lb 12.8 oz (49.4 kg)   LMP  (LMP Unknown)   SpO2 98%   BMI 21.32 kg/m    Subjective:    Patient ID: Kirsten Riley, female    DOB: 10/26/1931, 82 y.o.   MRN: [id number]  HPI: Kirsten FlightJoann H Riley is a 82 y.o. female  Chief Complaint  Patient presents with  . Diabetes  . Hypertension  . Hypothyroidism    Relevant past medical, surgical, family and social history reviewed and updated as indicated. Interim medical history since our last visit reviewed. Allergies and medications reviewed and updated.  Review of Systems  Per HPI unless specifically indicated above     Objective:    BP 132/75 (BP Location: Left Arm, Patient Position: Sitting, Cuff Size: Small)   Pulse 61   Temp 98 F (36.7 C)   Ht 4' 11.9" (1.521 m)   Wt 108 lb 12.8 oz (49.4 kg)   LMP  (LMP Unknown)   SpO2 98%   BMI 21.32 kg/m   Wt Readings from Last 3 Encounters:  10/18/15 108 lb 12.8 oz (49.4 kg)  07/17/15 112 lb (50.8 kg)  05/13/15 114 lb 12.8 oz (52.1 kg)    Physical Exam  Results for orders placed or performed in visit on 07/17/15  Bayer DCA Hb A1c Waived  Result Value Ref Range   Bayer DCA Hb A1c Waived 6.1 <7.0 %      Assessment & Plan:   Problem List Items Addressed This Visit      Unprioritized   RESOLVED: Diabetes mellitus without complication (HCC)   Relevant Medications   lovastatin (MEVACOR) 40 MG tablet   Other Relevant Orders   Bayer DCA Hb A1c Waived   Comprehensive metabolic panel   IFG (impaired fasting glucose)    Other Visit Diagnoses    Need for influenza vaccination    -  Primary   Relevant Orders   Flu vaccine HIGH DOSE PF (Completed)   Need for diphtheria-tetanus-pertussis (Tdap) vaccine, adult/adolescent       Relevant Orders   Tdap vaccine greater than or equal to 7yo IM (Completed)      Hgb A1C is 6.1 indicating IFG as  opposed to DM  Follow up plan: Return in about 6 months (around 04/17/2016) for physical.

## 2015-10-19 LAB — COMPREHENSIVE METABOLIC PANEL
A/G RATIO: 1.6 (ref 1.2–2.2)
ALT: 25 IU/L (ref 0–32)
AST: 36 IU/L (ref 0–40)
Albumin: 4 g/dL (ref 3.5–4.7)
Alkaline Phosphatase: 163 IU/L — ABNORMAL HIGH (ref 39–117)
BUN/Creatinine Ratio: 15 (ref 12–28)
BUN: 10 mg/dL (ref 8–27)
Bilirubin Total: 0.7 mg/dL (ref 0.0–1.2)
CALCIUM: 9.5 mg/dL (ref 8.7–10.3)
CHLORIDE: 103 mmol/L (ref 96–106)
CO2: 22 mmol/L (ref 18–29)
Creatinine, Ser: 0.66 mg/dL (ref 0.57–1.00)
GFR calc non Af Amer: 83 mL/min/{1.73_m2} (ref >59)
GFR, EST AFRICAN AMERICAN: 95 mL/min/{1.73_m2} (ref >59)
GLUCOSE: 121 mg/dL — AB (ref 65–99)
Globulin, Total: 2.5 g/dL (ref 1.5–4.5)
Potassium: 4.5 mmol/L (ref 3.5–5.2)
Sodium: 142 mmol/L (ref 134–144)
TOTAL PROTEIN: 6.5 g/dL (ref 6.0–8.5)

## 2015-10-21 ENCOUNTER — Encounter: Payer: Self-pay | Admitting: Unknown Physician Specialty

## 2015-11-15 ENCOUNTER — Telehealth: Payer: Self-pay

## 2015-11-15 ENCOUNTER — Other Ambulatory Visit: Payer: Self-pay

## 2015-11-15 MED ORDER — LEVOTHYROXINE SODIUM 75 MCG PO TABS: 75.0000 ug | ORAL_TABLET | Freq: Every day | ORAL | 2 refills | Status: DC

## 2015-11-15 NOTE — Telephone Encounter (Signed)
[facility] called and wanted to know if it was OK to change rx for levothyroxine to a 90 day supply because insurance pays for it that way.

## 2016-04-15 ENCOUNTER — Ambulatory Visit (INDEPENDENT_AMBULATORY_CARE_PROVIDER_SITE_OTHER): Payer: Medicare Other | Admitting: Unknown Physician Specialty

## 2016-04-15 ENCOUNTER — Encounter: Payer: Self-pay | Admitting: Unknown Physician Specialty

## 2016-04-15 VITALS — BP 125/72 | HR 62 | Temp 98.2°F | Ht 59.1 in | Wt 115.8 lb

## 2016-04-15 DIAGNOSIS — Z7189 Other specified counseling: Secondary | ICD-10-CM | POA: Insufficient documentation

## 2016-04-15 DIAGNOSIS — E7800 Pure hypercholesterolemia, unspecified: Secondary | ICD-10-CM | POA: Diagnosis not present

## 2016-04-15 DIAGNOSIS — M353 Polymyalgia rheumatica: Secondary | ICD-10-CM

## 2016-04-15 DIAGNOSIS — R7301 Impaired fasting glucose: Secondary | ICD-10-CM

## 2016-04-15 DIAGNOSIS — E2839 Other primary ovarian failure: Secondary | ICD-10-CM

## 2016-04-15 DIAGNOSIS — Z0000 Encounter for general adult medical examination without abnormal findings: Secondary | ICD-10-CM

## 2016-04-15 DIAGNOSIS — Z5181 Encounter for therapeutic drug level monitoring: Secondary | ICD-10-CM | POA: Diagnosis not present

## 2016-04-15 DIAGNOSIS — E038 Other specified hypothyroidism: Secondary | ICD-10-CM | POA: Diagnosis not present

## 2016-04-15 NOTE — Assessment & Plan Note (Signed)
CMP for chronic medication use including rheumatology medications

## 2016-04-15 NOTE — Assessment & Plan Note (Addendum)
Check cholesterol today.  Statins do not seem to be contributing to muscle pain of PR

## 2016-04-15 NOTE — Assessment & Plan Note (Signed)
Good control since off Prednisone.  Check Hgb A1C

## 2016-04-15 NOTE — Assessment & Plan Note (Signed)
Has Living Will and health care power of attourney.  Her HCPOA is her daughter Chestine Spore.  She will, if she remembers, bring them in for [place] to make a copy and scan into the chart.  She wishes to make no changes at this time.  She would like to be a full code unless it was a "prolonged thing."

## 2016-04-15 NOTE — Progress Notes (Signed)
BP 125/72 (BP Location: Left Arm, Patient Position: Sitting, Cuff Size: Normal)   Pulse 62   Temp 98.2 F (36.8 C)   Ht 4' 11.1" (1.501 m)   Wt 115 lb 12.8 oz (52.5 kg)   LMP  (LMP Unknown)   SpO2 98%   BMI 23.31 kg/m    Subjective:    Patient ID: Kirsten Riley, female    DOB: 1931-05-11, 83 y.o.   MRN: [id number]  HPI: Kirsten Riley is a 82 y.o. female  Chief Complaint  Patient presents with  . Medicare Wellness    pt states last eye exam in chart is correct    Polymyalgia rheumatica Under carre of Dr. Gavin Potters.  She is doing better.  She has been able to stay busy with gardening and is able to dig this year as opposed to last.    Hyperlipidemia Using medications without problems:  Diet compliance: Able to eat healthy Exercise: Able to stay active  Hypothyroid Weight and energy is good.    IFG DM for a period of time secondary to Prednisone.  This has resolved with coming off Prednisone and diet changes.  Does not check blood sugar.  Diet is good  Relevant past medical, surgical, family and social history reviewed and updated as indicated. Interim medical history since our last visit reviewed. Allergies and medications reviewed and updated.  Review of Systems  Constitutional: Negative.   HENT: Negative.   Eyes: Negative.   Respiratory: Negative.   Cardiovascular: Negative.   Gastrointestinal: Negative.   Endocrine: Negative.   Genitourinary: Negative.   Musculoskeletal: Negative.   Skin: Negative.   Allergic/Immunologic: Negative.   Neurological: Negative.   Hematological: Negative.   Psychiatric/Behavioral: Negative.     Per HPI unless specifically indicated above     Objective:    BP 125/72 (BP Location: Left Arm, Patient Position: Sitting, Cuff Size: Normal)   Pulse 62   Temp 98.2 F (36.8 C)   Ht 4' 11.1" (1.501 m)   Wt 115 lb 12.8 oz (52.5 kg)   LMP  (LMP Unknown)   SpO2 98%   BMI 23.31 kg/m   Wt Readings from Last 3 Encounters:  04/15/16  115 lb 12.8 oz (52.5 kg)  10/18/15 108 lb 12.8 oz (49.4 kg)  07/17/15 112 lb (50.8 kg)    Physical Exam  Constitutional: She is oriented to person, place, and time. She appears well-developed and well-nourished.  HENT:  Head: Normocephalic and atraumatic.  Eyes: Pupils are equal, round, and reactive to light. Right eye exhibits no discharge. Left eye exhibits no discharge. No scleral icterus.  Neck: Normal range of motion. Neck supple. Carotid bruit is not present. No thyromegaly present.  Cardiovascular: Normal rate, regular rhythm and normal heart sounds.  Exam reveals no gallop and no friction rub.   No murmur heard. Pulmonary/Chest: Effort normal and breath sounds normal. No respiratory distress. She has no wheezes. She has no rales.  Abdominal: Soft. Bowel sounds are normal. There is no tenderness. There is no rebound.  Genitourinary: No breast swelling, tenderness or discharge.  Musculoskeletal: Normal range of motion.  Lymphadenopathy:    She has no cervical adenopathy.  Neurological: She is alert and oriented to person, place, and time.  Skin: Skin is warm, dry and intact. No rash noted.  Psychiatric: She has a normal mood and affect. Her speech is normal and behavior is normal. Judgment and thought content normal. Cognition and memory are normal.    Results  for orders placed or performed in visit on 10/18/15  Hemoglobin A1c  Result Value Ref Range   Hemoglobin A1C 6.1%       Assessment & Plan:   Problem List Items Addressed This Visit      Unprioritized   Counseling regarding advanced directives and goals of care    Has Living Will and health care power of attourney.  Her HCPOA is her daughter Chestine Spore.  She will, if she remembers, bring them in for [place] to make a copy and scan into the chart.  She wishes to make no changes at this time.  She would like to be a full code unless it was a "prolonged thing."        Hyperlipidemia    Check cholesterol today.  Statins  do not seem to be contributing to muscle pain of PR      Relevant Orders   Lipid Panel w/o Chol/HDL Ratio   Hypothyroidism    Check TSH.  Euthyroid last visit.  Continue present medcation unles blood work indicates otherwise      IFG (impaired fasting glucose)    Good control since off Prednisone.  Check Hgb A1C      Relevant Orders   Comprehensive metabolic panel   Hgb A1c w/o eAG   Medication monitoring encounter    CMP for chronic medication use including rheumatology medications      Relevant Orders   Comprehensive metabolic panel   Polymyalgia rheumatica (HCC)    Check CMP for med monitoring.  Reviewed note from Dr. Gavin Potters       Other Visit Diagnoses    Annual physical exam    -  Primary   Ovarian failure       Relevant Orders   DG Bone Density       Follow up plan: Return in about 6 months (around 10/15/2016).

## 2016-04-15 NOTE — Assessment & Plan Note (Signed)
Check CMP for med monitoring.  Reviewed note from Dr. Gavin Potters

## 2016-04-15 NOTE — Assessment & Plan Note (Signed)
Check TSH.  Euthyroid last visit.  Continue present medcation unles blood work indicates otherwise

## 2016-04-16 LAB — COMPREHENSIVE METABOLIC PANEL
A/G RATIO: 1.8 (ref 1.2–2.2)
ALBUMIN: 4.4 g/dL (ref 3.5–4.7)
ALK PHOS: 133 IU/L — AB (ref 39–117)
ALT: 25 IU/L (ref 0–32)
AST: 31 IU/L (ref 0–40)
BILIRUBIN TOTAL: 0.9 mg/dL (ref 0.0–1.2)
BUN / CREAT RATIO: 13 (ref 12–28)
BUN: 10 mg/dL (ref 8–27)
CHLORIDE: 101 mmol/L (ref 96–106)
CO2: 24 mmol/L (ref 18–29)
CREATININE: 0.8 mg/dL (ref 0.57–1.00)
Calcium: 10.5 mg/dL — ABNORMAL HIGH (ref 8.7–10.3)
GFR calc Af Amer: 79 mL/min/{1.73_m2} (ref >59)
GFR calc non Af Amer: 69 mL/min/{1.73_m2} (ref >59)
GLOBULIN, TOTAL: 2.5 g/dL (ref 1.5–4.5)
GLUCOSE: 115 mg/dL — AB (ref 65–99)
Potassium: 4.8 mmol/L (ref 3.5–5.2)
Sodium: 143 mmol/L (ref 134–144)
TOTAL PROTEIN: 6.9 g/dL (ref 6.0–8.5)

## 2016-04-16 LAB — LIPID PANEL W/O CHOL/HDL RATIO
CHOLESTEROL TOTAL: 129 mg/dL (ref 100–199)
HDL: 41 mg/dL (ref >39)
LDL CALC: 69 mg/dL (ref 0–99)
TRIGLYCERIDES: 95 mg/dL (ref 0–149)
VLDL CHOLESTEROL CAL: 19 mg/dL (ref 5–40)

## 2016-04-16 LAB — HGB A1C W/O EAG: Hgb A1c MFr Bld: 6.7 % — ABNORMAL HIGH (ref 4.8–5.6)

## 2016-08-10 ENCOUNTER — Other Ambulatory Visit: Payer: Self-pay

## 2016-08-10 MED ORDER — LEVOTHYROXINE SODIUM 75 MCG PO TABS: 75.0000 ug | ORAL_TABLET | Freq: Every day | ORAL | 2 refills | Status: DC

## 2016-08-10 NOTE — Telephone Encounter (Signed)
Patient last seen 04/15/16 and has appointment 10/16/16.

## 2016-08-20 LAB — HM DIABETES EYE EXAM

## 2016-10-16 ENCOUNTER — Ambulatory Visit (INDEPENDENT_AMBULATORY_CARE_PROVIDER_SITE_OTHER): Payer: Medicare Other | Admitting: Unknown Physician Specialty

## 2016-10-16 ENCOUNTER — Encounter: Payer: Self-pay | Admitting: Unknown Physician Specialty

## 2016-10-16 VITALS — BP 120/62 | HR 59 | Temp 98.7°F | Ht 60.0 in | Wt 115.3 lb

## 2016-10-16 DIAGNOSIS — E038 Other specified hypothyroidism: Secondary | ICD-10-CM

## 2016-10-16 DIAGNOSIS — E119 Type 2 diabetes mellitus without complications: Secondary | ICD-10-CM | POA: Diagnosis not present

## 2016-10-16 DIAGNOSIS — Z23 Encounter for immunization: Secondary | ICD-10-CM

## 2016-10-16 NOTE — Assessment & Plan Note (Signed)
Check thyroid panel today 

## 2016-10-16 NOTE — Assessment & Plan Note (Signed)
Diet controlled.  Check Hgb A1C 

## 2016-10-16 NOTE — Progress Notes (Signed)
BP 120/62   Pulse (!) 59   Temp 98.7 F (37.1 C)   Ht 5' (1.524 m)   Wt 115 lb 4.8 oz (52.3 kg)   LMP  (LMP Unknown)   SpO2 98%   BMI 22.52 kg/m    Subjective:    Patient ID: Kirsten Riley, female    DOB: 1931/11/14, 83 y.o.   MRN: [id number]  HPI: Kirsten Riley is a 83 y.o. female  Chief Complaint  Patient presents with  . Hyperlipidemia  . Hypothyroidism  . Diabetes    pt states that she had a recent eye exam, will fax form to [facility]   Diabetes: Secondary to Prednisone.  Now taking methtrexate Using medications without difficulties No hypoglycemic episodes No hyperglycemic episodes Feet problems: none Blood Sugars averaging: not checking eye exam within last year Last Hgb A1C: 6.7  Hypothyroid Due to have TSH checked.    Rheumatoid arthritis Per Dr. Gavin Potters.    Relevant past medical, surgical, family and social history reviewed and updated as indicated. Interim medical history since our last visit reviewed. Allergies and medications reviewed and updated.  Review of Systems  Per HPI unless specifically indicated above     Objective:    BP 120/62   Pulse (!) 59   Temp 98.7 F (37.1 C)   Ht 5' (1.524 m)   Wt 115 lb 4.8 oz (52.3 kg)   LMP  (LMP Unknown)   SpO2 98%   BMI 22.52 kg/m   Wt Readings from Last 3 Encounters:  10/16/16 115 lb 4.8 oz (52.3 kg)  04/15/16 115 lb 12.8 oz (52.5 kg)  10/18/15 108 lb 12.8 oz (49.4 kg)    Physical Exam  Constitutional: She is oriented to person, place, and time. She appears well-developed and well-nourished. No distress.  HENT:  Head: Normocephalic and atraumatic.  Eyes: Conjunctivae and lids are normal. Right eye exhibits no discharge. Left eye exhibits no discharge. No scleral icterus.  Neck: Normal range of motion. Neck supple. No JVD present. Carotid bruit is not present.  Cardiovascular: Normal rate, regular rhythm and normal heart sounds.   Pulmonary/Chest: Effort normal and breath sounds normal.    Abdominal: Normal appearance. There is no splenomegaly or hepatomegaly.  Musculoskeletal: Normal range of motion.  Neurological: She is alert and oriented to person, place, and time.  Skin: Skin is warm, dry and intact. No rash noted. No pallor.  Psychiatric: She has a normal mood and affect. Her behavior is normal. Judgment and thought content normal.    Results for orders placed or performed in visit on 04/15/16  Comprehensive metabolic panel  Result Value Ref Range   Glucose 115 (H) 65 - 99 mg/dL   BUN 10 8 - 27 mg/dL   Creatinine, Ser 4.09 0.57 - 1.00 mg/dL   GFR calc non Af Amer 69 >59 mL/min/1.73   GFR calc Af Amer 79 >59 mL/min/1.73   BUN/Creatinine Ratio 13 12 - 28   Sodium 143 134 - 144 mmol/L   Potassium 4.8 3.5 - 5.2 mmol/L   Chloride 101 96 - 106 mmol/L   CO2 24 18 - 29 mmol/L   Calcium 10.5 (H) 8.7 - 10.3 mg/dL   Total Protein 6.9 6.0 - 8.5 g/dL   Albumin 4.4 3.5 - 4.7 g/dL   Globulin, Total 2.5 1.5 - 4.5 g/dL   Albumin/Globulin Ratio 1.8 1.2 - 2.2   Bilirubin Total 0.9 0.0 - 1.2 mg/dL   Alkaline Phosphatase 133 (H)  39 - 117 IU/L   AST 31 0 - 40 IU/L   ALT 25 0 - 32 IU/L  Lipid Panel w/o Chol/HDL Ratio  Result Value Ref Range   Cholesterol, Total 129 100 - 199 mg/dL   Triglycerides 95 0 - 149 mg/dL   HDL 41 >82 mg/dL   VLDL Cholesterol Cal 19 5 - 40 mg/dL   LDL Calculated 69 0 - 99 mg/dL  Hgb N5A w/o eAG  Result Value Ref Range   Hgb A1c MFr Bld 6.7 (H) 4.8 - 5.6 %      Assessment & Plan:   Problem List Items Addressed This Visit      Unprioritized   Diabetes mellitus without complication (HCC)    Diet controlled.  Check Hgb A1C      Relevant Orders   Thyroid Panel With TSH   Hypothyroidism    Check thyroid panel today      Relevant Orders   Hgb A1c w/o eAG    Other Visit Diagnoses    Need for influenza vaccination    -  Primary   Relevant Orders   Flu vaccine HIGH DOSE PF (Completed)       Follow up plan: Return in about 6 months  (around 04/16/2017), or if symptoms worsen or fail to improve.

## 2016-10-16 NOTE — Patient Instructions (Addendum)

## 2016-11-09 ENCOUNTER — Other Ambulatory Visit: Payer: Self-pay

## 2016-11-09 MED ORDER — LOVASTATIN 40 MG PO TABS: 40.0000 mg | ORAL_TABLET | Freq: Every day | ORAL | 1 refills | Status: DC

## 2016-11-09 NOTE — Telephone Encounter (Signed)
Patient last seen 10/16/16.

## 2017-01-13 IMAGING — CT CT ABD-PELV W/O CM
1 of 2 series · 14 of 32 positions shown, 18 images · non-contrast
Comparison: CT abdomen pelvis dated 06/06/2014

CLINICAL DATA: Right flank pain, status post fall 3 days ago

EXAM:
CT CHEST, ABDOMEN AND PELVIS WITHOUT CONTRAST
TECHNIQUE: Multidetector CT imaging of the chest, abdomen and pelvis was
performed following the standard protocol without IV contrast.

[Series 2: cap without · axial · non-contrast · 0.68mm/px · z∈[-610,-50]mm · 14 of 124 slices shown, 18 images]
[im 6/124  soft-tissue]
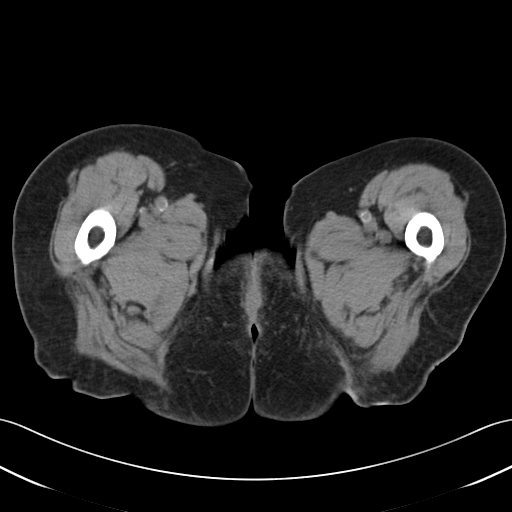
[im 6/124  bone]
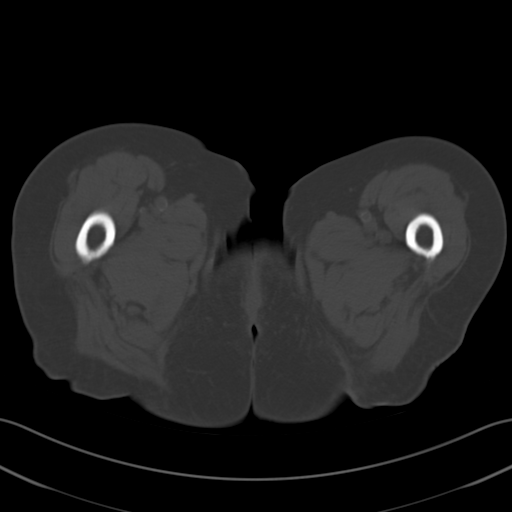
[im 18/124  soft-tissue]
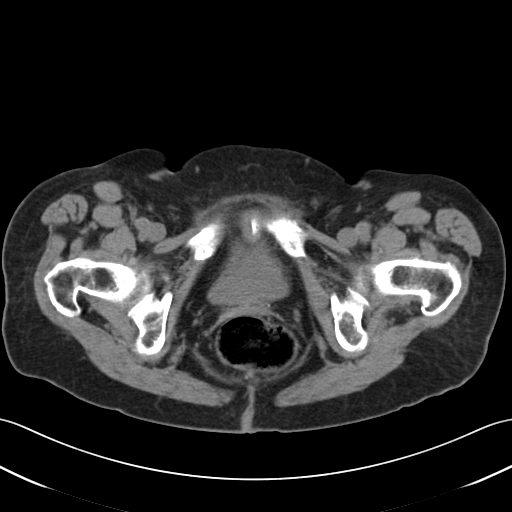
[im 30/124  soft-tissue]
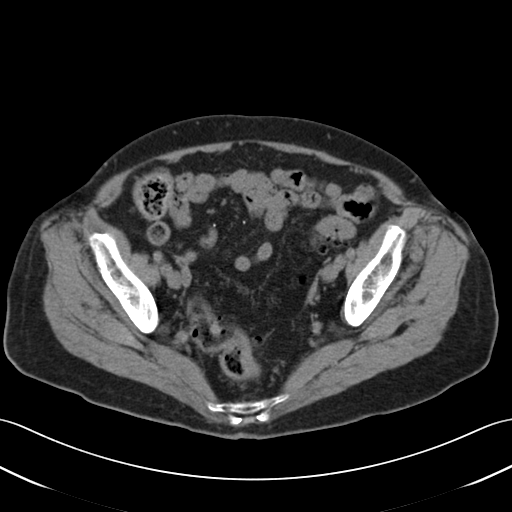
[im 36/124  soft-tissue]
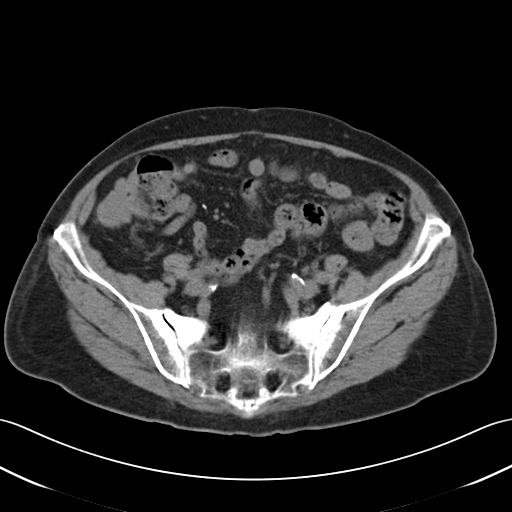
[im 47/124  soft-tissue]
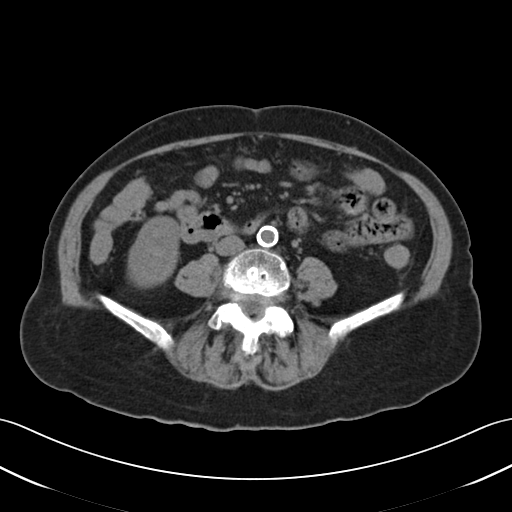
[im 59/124  soft-tissue]
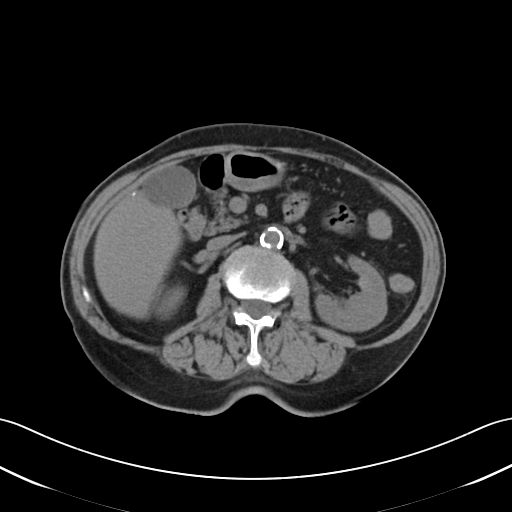
[im 65/124  soft-tissue]
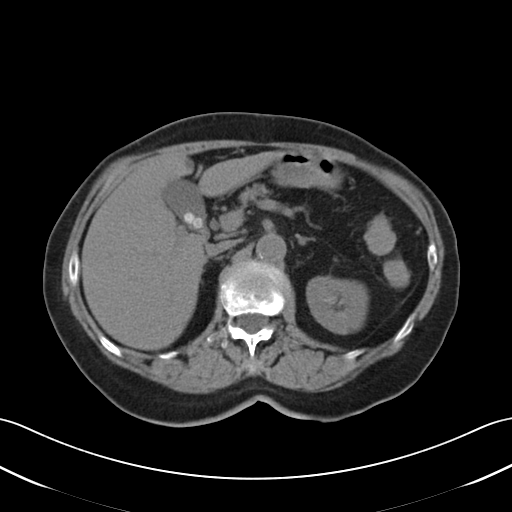
[im 77/124  soft-tissue]
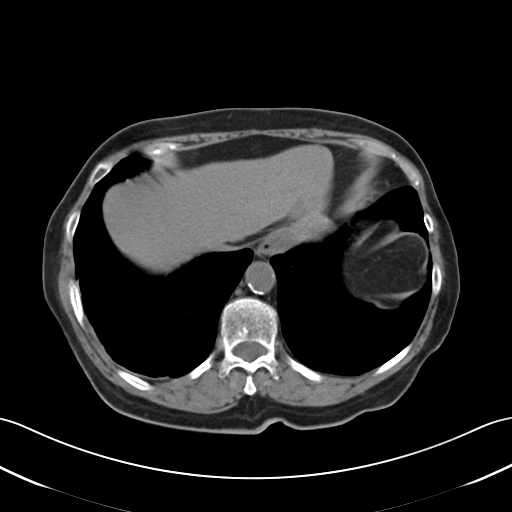
[im 88/124  soft-tissue]
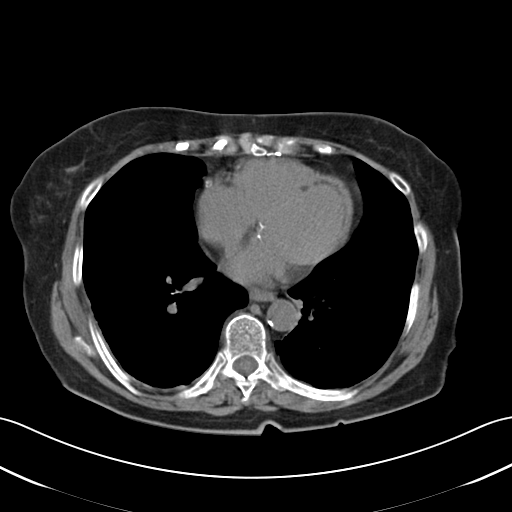
[im 88/124  bone]
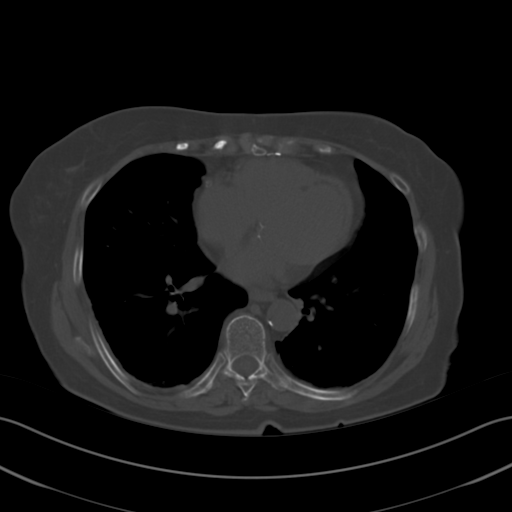
[im 94/124  soft-tissue]
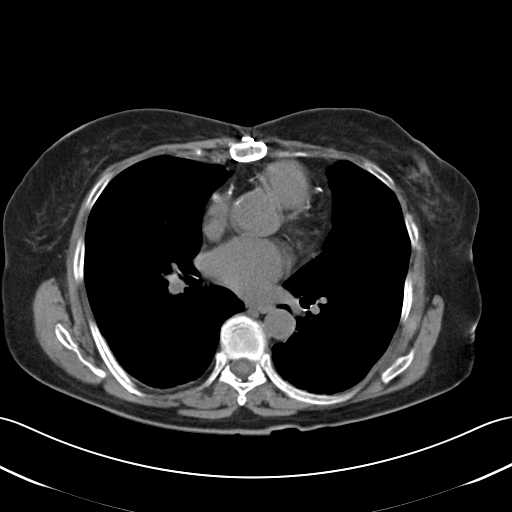
[im 100/124  lung]
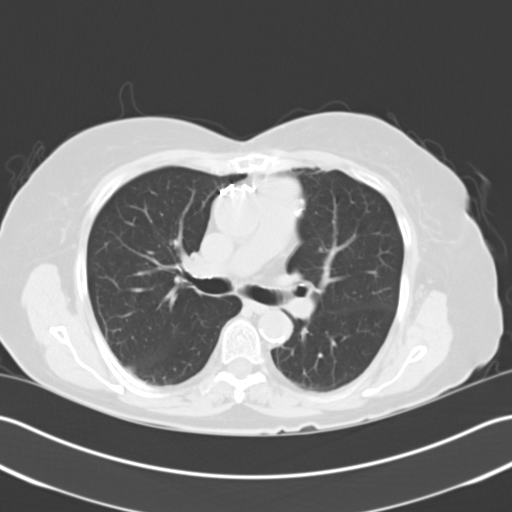
[im 106/124  soft-tissue]
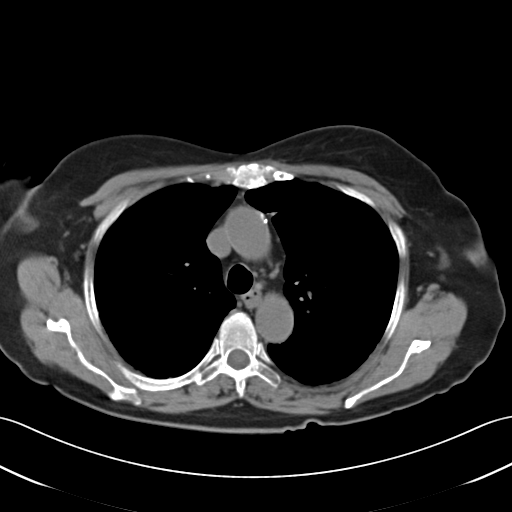
[im 106/124  lung]
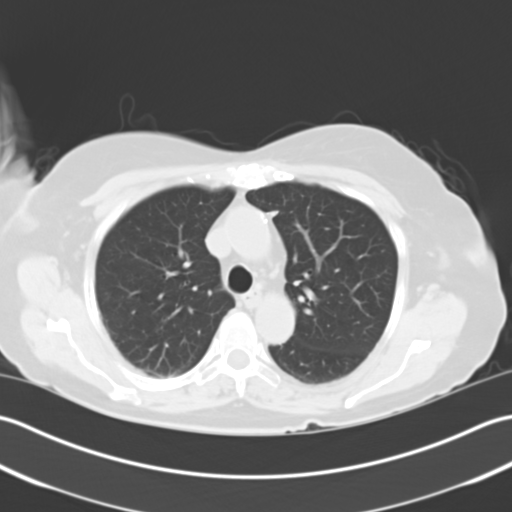
[im 112/124  lung]
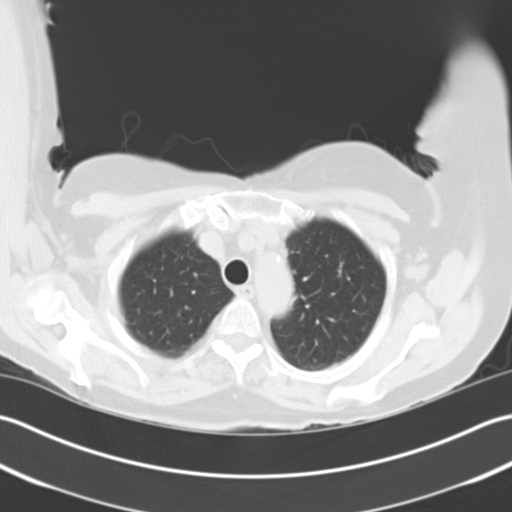
[im 118/124  soft-tissue]
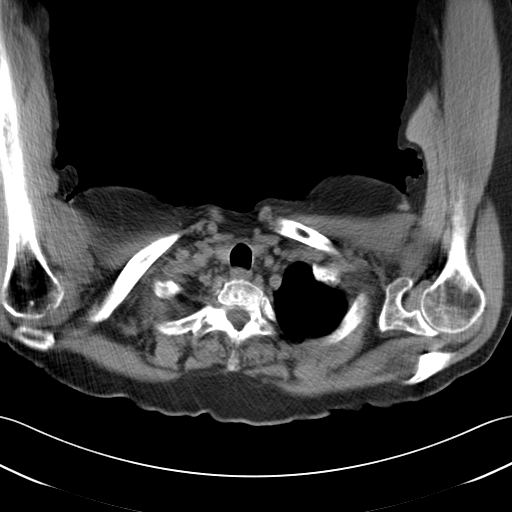
[im 118/124  lung]
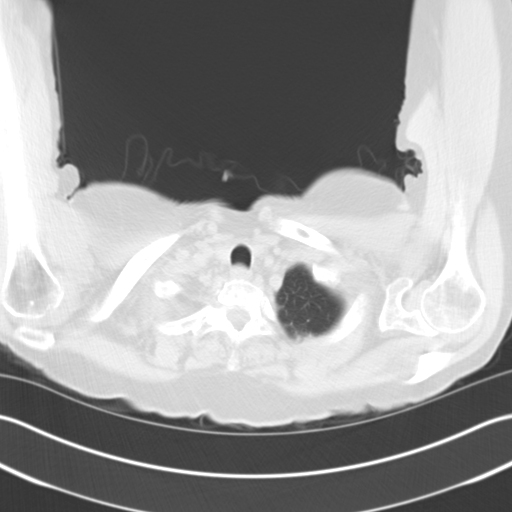

[14 of 32 positions shown; findings below may reference images not displayed]

FINDINGS: CT CHEST FINDINGS

Mediastinum/Nodes: No evidence of mediastinal hematoma.

The heart is normal in size. Subendocardial fat at the left
ventricular apex (series 2/ image 38), related to prior myocardial
infarction. No pericardial effusion.

Coronary atherosclerosis. Percutaneous stent. Postsurgical changes
related to prior CABG.

Atherosclerotic calcifications of the aortic arch.

Small mediastinal lymph nodes measuring up to 6 mm short axis
(series 2/image 20). No suspicious mediastinal or axillary
lymphadenopathy.

Visualized thyroid is unremarkable.

Lungs/Pleura: Lungs are essentially clear.

Mild biapical pleural-parenchymal scarring. Mild linear scarring in
the bilateral lung bases.

No focal consolidation.

No suspicious pulmonary nodules.

No pleural effusion or pneumothorax.

Musculoskeletal: Nondisplaced fracture of the right lateral 7th rib
(series 2/image 35).

Visualized osseous structures are otherwise within normal limits.

CT ABDOMEN PELVIS FINDINGS

Hepatobiliary: Unenhanced liver is notable for calcified
granulomata.

Layering gallstones (series 2/ image 61). No associated inflammatory
changes.

Pancreas: Within normal limits.

Spleen: Within normal limits.

Adrenals/Urinary Tract: Adrenal glands are within normal limits.

1.8 cm cyst in the medial left upper kidney (series 2/ image 64).
Right kidney is within normal limits.

No renal calculi or hydronephrosis.

Bladder is within normal limits.

Stomach/Bowel: Stomach is within normal limits.

No evidence of bowel obstruction.

Sigmoid diverticulosis, without evidence of diverticulitis.

Vascular/Lymphatic: Atherosclerotic calcifications of the abdominal
aorta and branch vessels.

No suspicious abdominopelvic lymphadenopathy.

Reproductive: Status post hysterectomy.

Bilateral ovaries are within normal limits.

Other: No abdominopelvic ascites.

Musculoskeletal: Mild sclerosis of the bilateral sacroiliac joints.

Visualized osseous structures are otherwise within normal limits.

No fracture is seen.
IMPRESSION: Nondisplaced fracture of the right lateral 7th rib.

Otherwise, no evidence of traumatic injury to the chest, abdomen, or
pelvis.

Stable ancillary findings as above.

## 2017-01-26 ENCOUNTER — Other Ambulatory Visit: Payer: Self-pay | Admitting: Unknown Physician Specialty

## 2017-01-26 ENCOUNTER — Ambulatory Visit: Payer: Medicare Other | Admitting: Unknown Physician Specialty

## 2017-01-26 ENCOUNTER — Encounter: Payer: Self-pay | Admitting: Unknown Physician Specialty

## 2017-01-26 VITALS — BP 114/68 | HR 62 | Temp 98.8°F | Wt 119.8 lb

## 2017-01-26 DIAGNOSIS — L989 Disorder of the skin and subcutaneous tissue, unspecified: Secondary | ICD-10-CM

## 2017-01-26 NOTE — Progress Notes (Signed)
   BP 114/68   Pulse 62   Temp 98.8 F (37.1 C) (Oral)   Wt 119 lb 12.8 oz (54.3 kg)   LMP  (LMP Unknown)   SpO2 96%   BMI 23.40 kg/m    Subjective:    Patient ID: Kirsten Riley, female    DOB: 04/22/1931, 84 y.o.   MRN: [id number]  HPI: Kirsten Riley is a 84 y.o. female  Chief Complaint  Patient presents with  . Cyst    pt states she has a cyst on her left side that has been draining for about a month that she cannot get to go away    Pt with cyst left abdominal wall.  States it's been there for years with drainage that comes and goes.  About 6 weeks ago it opened up with drainage and blood.  Area has not completely healed.    Relevant past medical, surgical, family and social history reviewed and updated as indicated. Interim medical history since our last visit reviewed. Allergies and medications reviewed and updated.  Review of Systems  Per HPI unless specifically indicated above     Objective:    BP 114/68   Pulse 62   Temp 98.8 F (37.1 C) (Oral)   Wt 119 lb 12.8 oz (54.3 kg)   LMP  (LMP Unknown)   SpO2 96%   BMI 23.40 kg/m   Wt Readings from Last 3 Encounters:  01/26/17 119 lb 12.8 oz (54.3 kg)  10/16/16 115 lb 4.8 oz (52.3 kg)  04/15/16 115 lb 12.8 oz (52.5 kg)    Physical Exam  Constitutional: She is oriented to person, place, and time. She appears well-developed and well-nourished. No distress.  HENT:  Head: Normocephalic and atraumatic.  Eyes: Conjunctivae and lids are normal. Right eye exhibits no discharge. Left eye exhibits no discharge. No scleral icterus.  Neck: Normal range of motion. Neck supple. No JVD present. Carotid bruit is not present.  Cardiovascular: Normal rate, regular rhythm and normal heart sounds.  Pulmonary/Chest: Effort normal and breath sounds normal.  Abdominal: Normal appearance. There is no splenomegaly or hepatomegaly.  Musculoskeletal: Normal range of motion.  Neurological: She is alert and oriented to person, place, and time.   Skin: Skin is warm, dry and intact. No rash noted. No pallor.  Dime sized lesion left lower abdominal wall.  No fluctulance or drainage at this time  Psychiatric: She has a normal mood and affect. Her behavior is normal. Judgment and thought content normal.   After informed consent and pt ed on scarring, area infiltrated with lidocaine with Epinephrine.  Part of the dermis shaved off with # 15 blade.  Sample sent for pathology.  Area cauterized with silver nitrate stick.   Pt tolerated the procedure well   Results for orders placed or performed in visit on 08/20/16  HM DIABETES EYE EXAM  Result Value Ref Range   HM Diabetic Eye Exam No Retinopathy No Retinopathy      Assessment & Plan:   Problem List Items Addressed This Visit    None    Visit Diagnoses    Skin lesion    -  Primary   pt with non-healing lesion left abdominal wall. Shave biopsy performed.  Keep areal covered with ointment and gauze   Relevant Orders   Pathology       Follow up plan: Return if symptoms worsen or fail to improve.

## 2017-01-28 LAB — PATHOLOGY

## 2017-04-15 ENCOUNTER — Ambulatory Visit (INDEPENDENT_AMBULATORY_CARE_PROVIDER_SITE_OTHER): Payer: Medicare Other

## 2017-04-15 VITALS — BP 120/62 | HR 58 | Temp 98.0°F | Resp 15 | Ht 59.0 in | Wt 120.1 lb

## 2017-04-15 DIAGNOSIS — Z0000 Encounter for general adult medical examination without abnormal findings: Secondary | ICD-10-CM

## 2017-04-15 NOTE — Progress Notes (Signed)
Subjective:   Kirsten Riley is a 84 y.o. female who presents for an Initial Medicare Annual Wellness Visit.  Review of Systems      Cardiac Risk Factors include: hypertension;advanced age (>46men, >67 women);diabetes mellitus;dyslipidemia;family history of premature cardiovascular disease     Objective:    Today's Vitals   04/15/17 0849  BP: 120/62  Pulse: (!) 58  Resp: 15  Temp: 98 F (36.7 C)  TempSrc: Temporal  Weight: 120 lb 1.6 oz (54.5 kg)  Height: 4\' 11"  (1.499 m)   Body mass index is 24.26 kg/m.  Advanced Directives 04/15/2016 01/09/2015  Does Patient Have a Medical Advance Directive? Yes No  Type of Estate agent of [place];Living will -  Copy of Healthcare Power of Attorney in Chart? No - copy requested -    Current Medications (verified) Outpatient Encounter Medications as of 04/15/2017  Medication Sig  . alendronate (FOSAMAX) 70 MG tablet Take 70 mg by mouth once a week.  . calcium-vitamin D 250-100 MG-UNIT per tablet Take 1 tablet by mouth 2 (two) times daily.  Marland Kitchen levothyroxine (SYNTHROID, LEVOTHROID) 75 MCG tablet Take 1 tablet (75 mcg total) by mouth daily before breakfast.  . lovastatin (MEVACOR) 40 MG tablet Take 1 tablet (40 mg total) by mouth at bedtime.  . methotrexate (RHEUMATREX) 2.5 MG tablet Take 5 tablets by mouth once a week.   . Multiple Vitamin (MULTIVITAMIN WITH MINERALS) TABS tablet Take 1 tablet by mouth daily.  . folic acid (FOLVITE) 1 MG tablet Take by mouth.   No facility-administered encounter medications on file as of 04/15/2017.     Allergies (verified) Sulfa antibiotics and Penicillins   History: Past Medical History:  Diagnosis Date  . CAD (coronary artery disease)   . Diabetes mellitus (HCC) 12/10/2014  . Fractured rib   . Hyperlipidemia   . Hypertension   . Hypothyroidism   . Polymyalgia rheumatica (HCC) 06/08/2014   Past Surgical History:  Procedure Laterality Date  . ABDOMINAL HYSTERECTOMY  1978   partial  . CORONARY ARTERY BYPASS GRAFT    . hypercholesterol    . hypothyroid     Family History  Problem Relation Age of Onset  . Heart disease Mother   . Stroke Mother   . Hypertension Mother   . Cancer Father   . Cancer Sister        breast, ovarian, pancreatic  . Heart disease Sister        x2  . Cancer Brother   . Heart disease Brother   . Cancer Brother   . Heart disease Brother   . Cancer Brother   . Heart disease Brother    Social History   Socioeconomic History  . Marital status: Divorced    Spouse name: Not on file  . Number of children: Not on file  . Years of education: Not on file  . Highest education level: Not on file  Occupational History  . Not on file  Social Needs  . Financial resource strain: Not hard at all  . Food insecurity:    Worry: Never true    Inability: Never true  . Transportation needs:    Medical: No    Non-medical: No  Tobacco Use  . Smoking status: Never Smoker  . Smokeless tobacco: Never Used  Substance and Sexual Activity  . Alcohol use: No    Alcohol/week: 0.0 oz  . Drug use: No  . Sexual activity: Never  Lifestyle  . Physical activity:  Days per week: 0 days    Minutes per session: 0 min  . Stress: Not at all  Relationships  . Social connections:    Talks on phone: More than three times a week    Gets together: More than three times a week    Attends religious service: More than 4 times per year    Active member of club or organization: No    Attends meetings of clubs or organizations: Never    Relationship status: Divorced  Other Topics Concern  . Not on file  Social History Narrative  . Not on file    Tobacco Counseling Counseling given: Not Answered   Clinical Intake:  Pre-visit preparation completed: Yes  Pain : No/denies pain     Nutritional Status: BMI of 19-24  Normal Diabetes: Yes CBG done?: No Did pt. bring in CBG monitor from home?: No  How often do you need to have someone help you  when you read instructions, pamphlets, or other written materials from your doctor or pharmacy?: 1 - Never What is the last grade level you completed in school?: 12th grade  Interpreter Needed?: No  Information entered by :: Mckinzee Spirito,LPN    Activities of Daily Living In your present state of health, do you have any difficulty performing the following activities: 04/15/2017 04/15/2016  Hearing? N N  Vision? N N  Difficulty concentrating or making decisions? N N  Walking or climbing stairs? N N  Comment - pt states she needs a handrail  Dressing or bathing? N N  Doing errands, shopping? N N  Preparing Food and eating ? N -  Using the Toilet? N -  In the past six months, have you accidently leaked urine? N -  Do you have problems with loss of bowel control? N -  Managing your Medications? N -  Managing your Finances? N -  Housekeeping or managing your Housekeeping? N -  Some recent data might be hidden     Immunizations and Health Maintenance Immunization History  Administered Date(s) Administered  . Influenza, High Dose Seasonal PF 10/18/2015, 10/16/2016  . Influenza,inj,Quad PF,6+ Mos 10/09/2014  . Pneumococcal Conjugate-13 10/09/2013  . Pneumococcal Polysaccharide-23 11/08/2005  . Td 09/22/2005  . Tdap 10/18/2015  . Zoster 09/22/2005   Health Maintenance Due  Topic Date Due  . URINE MICROALBUMIN  06[birth year]21/1943  . DEXA SCAN  06/21/1998  . FOOT EXAM  03/11/2016  . HEMOGLOBIN A1C  10/15/2016    Patient Care Team: Gabriel CirriWicker, [name], NP as PCP - General (Nurse Practitioner) Kandyce RudKernodle, George W Jr., MD (Rheumatology)  Indicate any recent Medical Services you may have received from other than [facility] providers in the past year (date may be approximate).     Assessment:   This is a routine wellness examination for Kirsten Riley.  Hearing/Vision screen Vision Screening Comments: Sees Dr.Brasington annually   Dietary issues and exercise activities discussed: Current Exercise Habits:  The patient does not participate in regular exercise at present, Exercise limited by: None identified  Goals    . DIET - INCREASE WATER INTAKE     Recommend drinking at least 6-8 glasses of water a day       Depression Screen PHQ 2/9 Scores 04/15/2017 04/15/2016 10/18/2015 07/31/2014  PHQ - 2 Score 0 0 0 0  PHQ- 9 Score - 0 - -    Fall Risk Fall Risk  04/15/2017 04/15/2016 10/18/2015 03/12/2015 07/31/2014  Falls in the past year? No No No Yes No  Number  falls in past yr: - - - 1 -  Injury with Fall? - - - Yes -  Comment - - - pt states she broke her 8th rib during fall, but states she is doing good -    Is the patient's home free of loose throw rugs in walkways, pet beds, electrical cords, etc?   yes      Grab bars in the bathroom? yes      Handrails on the stairs?   no stairs       Adequate lighting?   yes  Timed Get Up and Go Performed Completed in 8 seconds with no use of assistive devices, steady gait. No intervention needed at this time.   Cognitive Function:     6CIT Screen 04/15/2017  What Year? 0 points  What month? 0 points  What time? 0 points  Count back from 20 0 points  Months in reverse 0 points  Repeat phrase 0 points  Total Score 0    Screening Tests Health Maintenance  Topic Date Due  . URINE MICROALBUMIN  06[birth year]21/1943  . DEXA SCAN  06/21/1998  . FOOT EXAM  03/11/2016  . HEMOGLOBIN A1C  10/15/2016  . INFLUENZA VACCINE  08/12/2017  . OPHTHALMOLOGY EXAM  08/20/2017  . TETANUS/TDAP  10/17/2025  . PNA vac Low Risk Adult  Completed    Qualifies for Shingles Vaccine? Yes, discussed shingrix vaccine   Cancer Screenings: Lung: Low Dose CT Chest recommended if Age 29-80 years, 30 pack-year currently smoking OR have quit w/in 15years. Patient does not qualify. Breast: Up to date on Mammogram? Yes   Up to date of Bone Density/Dexa? No, order in system  Colorectal: no longer required  Additional Screenings:  Hepatitis C Screening: not indicated      Plan:    I  have personally reviewed and addressed the Medicare Annual Wellness questionnaire and have noted the following in the patient's chart:  A. Medical and social history B. Use of alcohol, tobacco or illicit drugs  C. Current medications and supplements D. Functional ability and status E.  Nutritional status F.  Physical activity G. Advance directives H. List of other physicians I.  Hospitalizations, surgeries, and ER visits in previous 12 months J.  Vitals K. Screenings such as hearing and vision if needed, cognitive and depression L. Referrals and appointments   In addition, I have reviewed and discussed with patient certain preventive protocols, quality metrics, and best practice recommendations. A written personalized care plan for preventive services as well as general preventive health recommendations were provided to patient.   Signed,  Marin Roberts, LPN Nurse Health Advisor   Nurse Notes: due for diabetic foot exam. - appt 04/19/2017 with Phineas Inches.

## 2017-04-15 NOTE — Patient Instructions (Addendum)
Kirsten Riley , Thank you for taking time to come for your Medicare Wellness Visit. I appreciate your ongoing commitment to your health goals. Please review the following plan we discussed and let me know if I can assist you in the future.   Screening recommendations/referrals: Colonoscopy: no longer required Mammogram: no longer required Bone Density: Please call [phone] to schedule  Recommended yearly ophthalmology/optometry visit for glaucoma screening and checkup Recommended yearly dental visit for hygiene and checkup  Vaccinations: Influenza vaccine: up to date Pneumococcal vaccine: up to date Tdap vaccine: up to date Shingles vaccine: received zostavax  shingrix eligible- check with your insurance company for coverage   Advanced directives: Please bring a copy of your health care power of attorney and living will to the office at your convenience.  Conditions/risks identified: Recommend drinking at least 6-8 glasses of water a day   Next appointment: Follow up on 04/19/2017 at 9am with Phineas Inchesheryl Wicker,NP. Follow up in one year for your annual wellness exam.    Preventive Care 82 Years and Older, Female Preventive care refers to lifestyle choices and visits with your health care provider that can promote health and wellness. What does preventive care include?  A yearly physical exam. This is also called an annual well check.  Dental exams once or twice a year.  Routine eye exams. Ask your health care provider how often you should have your eyes checked.  Personal lifestyle choices, including:  Daily care of your teeth and gums.  Regular physical activity.  Eating a healthy diet.  Avoiding tobacco and drug use.  Limiting alcohol use.  Practicing safe sex.  Taking low-dose aspirin every day.  Taking vitamin and mineral supplements as recommended by your health care provider. What happens during an annual well check? The services and screenings done by your health care  provider during your annual well check will depend on your age, overall health, lifestyle risk factors, and family history of disease. Counseling  Your health care provider may ask you questions about your:  Alcohol use.  Tobacco use.  Drug use.  Emotional well-being.  Home and relationship well-being.  Sexual activity.  Eating habits.  History of falls.  Memory and ability to understand (cognition).  Work and work Astronomerenvironment.  Reproductive health. Screening  You may have the following tests or measurements:  Height, weight, and BMI.  Blood pressure.  Lipid and cholesterol levels. These may be checked every 5 years, or more frequently if you are over 82 years old.  Skin check.  Lung cancer screening. You may have this screening every year starting at age 82 if you have a 30-pack-year history of smoking and currently smoke or have quit within the past 15 years.  Fecal occult blood test (FOBT) of the stool. You may have this test every year starting at age 82.  Flexible sigmoidoscopy or colonoscopy. You may have a sigmoidoscopy every 5 years or a colonoscopy every 10 years starting at age 82.  Hepatitis C blood test.  Hepatitis B blood test.  Sexually transmitted disease (STD) testing.  Diabetes screening. This is done by checking your blood sugar (glucose) after you have not eaten for a while (fasting). You may have this done every 1-3 years.  Bone density scan. This is done to screen for osteoporosis. You may have this done starting at age 82.  Mammogram. This may be done every 1-2 years. Talk to your health care provider about how often you should have regular mammograms. Talk with  your health care provider about your test results, treatment options, and if necessary, the need for more tests. Vaccines  Your health care provider may recommend certain vaccines, such as:  Influenza vaccine. This is recommended every year.  Tetanus, diphtheria, and acellular  pertussis (Tdap, Td) vaccine. You may need a Td booster every 10 years.  Zoster vaccine. You may need this after age 54.  Pneumococcal 13-valent conjugate (PCV13) vaccine. One dose is recommended after age 82.  Pneumococcal polysaccharide (PPSV23) vaccine. One dose is recommended after age 82. Talk to your health care provider about which screenings and vaccines you need and how often you need them. This information is not intended to replace advice given to you by your health care provider. Make sure you discuss any questions you have with your health care provider. Document Released: 01/25/2015 Document Revised: 09/18/2015 Document Reviewed: 10/30/2014 Elsevier Interactive Patient Education  2017 [employer] Prevention in the Home Falls can cause injuries. They can happen to people of all ages. There are many things you can do to make your home safe and to help prevent falls. What can I do on the outside of my home?  Regularly fix the edges of walkways and driveways and fix any cracks.  Remove anything that might make you trip as you walk through a door, such as a raised step or threshold.  Trim any bushes or trees on the path to your home.  Use bright outdoor lighting.  Clear any walking paths of anything that might make someone trip, such as rocks or tools.  Regularly check to see if handrails are loose or broken. Make sure that both sides of any steps have handrails.  Any raised decks and porches should have guardrails on the edges.  Have any leaves, snow, or ice cleared regularly.  Use sand or salt on walking paths during winter.  Clean up any spills in your garage right away. This includes oil or grease spills. What can I do in the bathroom?  Use night lights.  Install grab bars by the toilet and in the tub and shower. Do not use towel bars as grab bars.  Use non-skid mats or decals in the tub or shower.  If you need to sit down in the shower, use a plastic,  non-slip stool.  Keep the floor dry. Clean up any water that spills on the floor as soon as it happens.  Remove soap buildup in the tub or shower regularly.  Attach bath mats securely with double-sided non-slip rug tape.  Do not have throw rugs and other things on the floor that can make you trip. What can I do in the bedroom?  Use night lights.  Make sure that you have a light by your bed that is easy to reach.  Do not use any sheets or blankets that are too big for your bed. They should not hang down onto the floor.  Have a firm chair that has side arms. You can use this for support while you get dressed.  Do not have throw rugs and other things on the floor that can make you trip. What can I do in the kitchen?  Clean up any spills right away.  Avoid walking on wet floors.  Keep items that you use a lot in easy-to-reach places.  If you need to reach something above you, use a strong step stool that has a grab bar.  Keep electrical cords out of the way.  Do not  use floor polish or wax that makes floors slippery. If you must use wax, use non-skid floor wax.  Do not have throw rugs and other things on the floor that can make you trip. What can I do with my stairs?  Do not leave any items on the stairs.  Make sure that there are handrails on both sides of the stairs and use them. Fix handrails that are broken or loose. Make sure that handrails are as long as the stairways.  Check any carpeting to make sure that it is firmly attached to the stairs. Fix any carpet that is loose or worn.  Avoid having throw rugs at the top or bottom of the stairs. If you do have throw rugs, attach them to the floor with carpet tape.  Make sure that you have a light switch at the top of the stairs and the bottom of the stairs. If you do not have them, ask someone to add them for you. What else can I do to help prevent falls?  Wear shoes that:  Do not have high heels.  Have rubber  bottoms.  Are comfortable and fit you well.  Are closed at the toe. Do not wear sandals.  If you use a stepladder:  Make sure that it is fully opened. Do not climb a closed stepladder.  Make sure that both sides of the stepladder are locked into place.  Ask someone to hold it for you, if possible.  Clearly mark and make sure that you can see:  Any grab bars or handrails.  First and last steps.  Where the edge of each step is.  Use tools that help you move around (mobility aids) if they are needed. These include:  Canes.  Walkers.  Scooters.  Crutches.  Turn on the lights when you go into a dark area. Replace any light bulbs as soon as they burn out.  Set up your furniture so you have a clear path. Avoid moving your furniture around.  If any of your floors are uneven, fix them.  If there are any pets around you, be aware of where they are.  Review your medicines with your doctor. Some medicines can make you feel dizzy. This can increase your chance of falling. Ask your doctor what other things that you can do to help prevent falls. This information is not intended to replace advice given to you by your health care provider. Make sure you discuss any questions you have with your health care provider. Document Released: 10/25/2008 Document Revised: 06/06/2015 Document Reviewed: 02/02/2014 Elsevier Interactive Patient Education  2017 [employer].

## 2017-04-19 ENCOUNTER — Encounter: Payer: Self-pay | Admitting: Unknown Physician Specialty

## 2017-04-19 ENCOUNTER — Ambulatory Visit (INDEPENDENT_AMBULATORY_CARE_PROVIDER_SITE_OTHER): Payer: Medicare Other | Admitting: Unknown Physician Specialty

## 2017-04-19 VITALS — BP 117/72 | HR 59 | Temp 97.9°F | Ht 59.0 in | Wt 120.5 lb

## 2017-04-19 DIAGNOSIS — L659 Nonscarring hair loss, unspecified: Secondary | ICD-10-CM | POA: Diagnosis not present

## 2017-04-19 DIAGNOSIS — E785 Hyperlipidemia, unspecified: Secondary | ICD-10-CM | POA: Diagnosis not present

## 2017-04-19 DIAGNOSIS — M8580 Other specified disorders of bone density and structure, unspecified site: Secondary | ICD-10-CM | POA: Diagnosis not present

## 2017-04-19 DIAGNOSIS — E119 Type 2 diabetes mellitus without complications: Secondary | ICD-10-CM

## 2017-04-19 DIAGNOSIS — Z0000 Encounter for general adult medical examination without abnormal findings: Secondary | ICD-10-CM | POA: Diagnosis not present

## 2017-04-19 DIAGNOSIS — E038 Other specified hypothyroidism: Secondary | ICD-10-CM

## 2017-04-19 DIAGNOSIS — M810 Age-related osteoporosis without current pathological fracture: Secondary | ICD-10-CM | POA: Insufficient documentation

## 2017-04-19 LAB — BAYER DCA HB A1C WAIVED: HB A1C: 6.3 % (ref <7.0)

## 2017-04-19 NOTE — Assessment & Plan Note (Signed)
Checked through Dr. Gavin PottersKernodle and treated with Fosamax

## 2017-04-19 NOTE — Assessment & Plan Note (Signed)
Diet controlled and off Prednisone.  Check Hgb A1C today

## 2017-04-19 NOTE — Assessment & Plan Note (Addendum)
Female pattern.  Recommended Rogain.  Consider Spironalactone.  Check Hgb AIC

## 2017-04-19 NOTE — Assessment & Plan Note (Signed)
Check TSH 

## 2017-04-19 NOTE — Progress Notes (Signed)
BP 117/72   Pulse (!) 59   Temp 97.9 F (36.6 C) (Oral)   Ht 4\' 11"  (1.499 m)   Wt 120 lb 8 oz (54.7 kg)   LMP  (LMP Unknown)   SpO2 98%   BMI 24.34 kg/m    Subjective:    Patient ID: Kirsten Riley, female    DOB: [birth year], 84 y.o.   MRN: [id number]  HPI: Kirsten Riley is a 84 y.o. female  Chief Complaint  Patient presents with  . Annual Exam    pt had wellness exam 04/15/17 with NHA   RA Seeing rheumatology and now on low dose MTX.  Off all the prednisone.    DM History of DM while on prednisone.  Has not been checked for about a year due to difficulty with blood drawing.    Hypothyroid No change in weight or energy level  Relevant past medical, surgical, family and social history reviewed and updated as indicated. Interim medical history since our last visit reviewed. Allergies and medications reviewed and updated.  Review of Systems  Constitutional: Negative.   HENT: Negative.   Eyes: Negative.   Respiratory: Negative.   Cardiovascular: Negative.   Gastrointestinal: Negative.   Endocrine: Negative.   Genitourinary: Negative.   Musculoskeletal: Negative.   Skin:       Noted hair loss in back of scalp.  Similar when diagnosed with DM  Allergic/Immunologic: Negative.   Neurological: Negative.   Hematological: Negative.   Psychiatric/Behavioral: Negative.     Per HPI unless specifically indicated above     Objective:    BP 117/72   Pulse (!) 59   Temp 97.9 F (36.6 C) (Oral)   Ht 4\' 11"  (1.499 m)   Wt 120 lb 8 oz (54.7 kg)   LMP  (LMP Unknown)   SpO2 98%   BMI 24.34 kg/m   Wt Readings from Last 3 Encounters:  04/19/17 120 lb 8 oz (54.7 kg)  04/15/17 120 lb 1.6 oz (54.5 kg)  01/26/17 119 lb 12.8 oz (54.3 kg)    Physical Exam  Constitutional: She is oriented to person, place, and time. She appears well-developed and well-nourished.  HENT:  Head: Normocephalic and atraumatic.  Eyes: Pupils are equal, round, and reactive to light. Right eye  exhibits no discharge. Left eye exhibits no discharge. No scleral icterus.  Neck: Normal range of motion. Neck supple. Carotid bruit is not present. No thyromegaly present.  Cardiovascular: Normal rate, regular rhythm and normal heart sounds. Exam reveals no gallop and no friction rub.  No murmur heard. Pulmonary/Chest: Effort normal and breath sounds normal. No respiratory distress. She has no wheezes. She has no rales. No breast swelling, tenderness or discharge.  Abdominal: Soft. Bowel sounds are normal. There is no tenderness. There is no rebound.  Genitourinary: No breast swelling, tenderness or discharge.  Musculoskeletal: Normal range of motion.  Lymphadenopathy:    She has no cervical adenopathy.  Neurological: She is alert and oriented to person, place, and time.  Skin: Skin is warm, dry and intact. No rash noted.  Psychiatric: She has a normal mood and affect. Her speech is normal and behavior is normal. Judgment and thought content normal. Cognition and memory are normal.    Results for orders placed or performed in visit on 01/26/17  Pathology  Result Value Ref Range   . Comment    . Comment    . Comment    . Comment    . Comment    .  Comment    . Comment       Assessment & Plan:   Problem List Items Addressed This Visit      Unprioritized   Alopecia    Female pattern.  Recommended Rogain.  Consider Spironalactone.  Check Hgb AIC       Diabetes mellitus without complication (HCC)    Diet controlled and off Prednisone.  Check Hgb A1C today      Relevant Orders   Comprehensive metabolic panel   Bayer DCA Hb [id number] Waived   Hyperlipidemia   Relevant Orders   Lipid Panel w/o Chol/HDL Ratio   Hypothyroidism    Check TSH      Relevant Orders   TSH   Osteopenia    Checked through Dr. Gavin PottersKernodle and treated with Fosamax       Other Visit Diagnoses    Annual physical exam    -  Primary       Follow up plan: Return in about 6 months (around  10/19/2017).

## 2017-04-20 LAB — COMPREHENSIVE METABOLIC PANEL
ALBUMIN: 4.6 g/dL (ref 3.5–4.7)
ALK PHOS: 98 IU/L (ref 39–117)
ALT: 32 IU/L (ref 0–32)
AST: 39 IU/L (ref 0–40)
Albumin/Globulin Ratio: 1.9 (ref 1.2–2.2)
BILIRUBIN TOTAL: 0.8 mg/dL (ref 0.0–1.2)
BUN / CREAT RATIO: 18 (ref 12–28)
BUN: 14 mg/dL (ref 8–27)
CHLORIDE: 104 mmol/L (ref 96–106)
CO2: 20 mmol/L (ref 20–29)
Calcium: 10.3 mg/dL (ref 8.7–10.3)
Creatinine, Ser: 0.79 mg/dL (ref 0.57–1.00)
GFR calc Af Amer: 80 mL/min/{1.73_m2} (ref >59)
GFR calc non Af Amer: 69 mL/min/{1.73_m2} (ref >59)
GLOBULIN, TOTAL: 2.4 g/dL (ref 1.5–4.5)
Glucose: 128 mg/dL — ABNORMAL HIGH (ref 65–99)
POTASSIUM: 4.6 mmol/L (ref 3.5–5.2)
SODIUM: 143 mmol/L (ref 134–144)
Total Protein: 7 g/dL (ref 6.0–8.5)

## 2017-04-20 LAB — LIPID PANEL W/O CHOL/HDL RATIO
Cholesterol, Total: 128 mg/dL (ref 100–199)
HDL: 40 mg/dL (ref >39)
LDL Calculated: 68 mg/dL (ref 0–99)
Triglycerides: 102 mg/dL (ref 0–149)
VLDL Cholesterol Cal: 20 mg/dL (ref 5–40)

## 2017-04-20 LAB — TSH: TSH: 0.295 u[IU]/mL — ABNORMAL LOW (ref 0.450–4.500)

## 2017-04-23 ENCOUNTER — Telehealth: Payer: Self-pay | Admitting: Unknown Physician Specialty

## 2017-04-23 DIAGNOSIS — E039 Hypothyroidism, unspecified: Secondary | ICD-10-CM

## 2017-04-23 MED ORDER — LEVOTHYROXINE SODIUM 50 MCG PO TABS: 50.0000 ug | ORAL_TABLET | Freq: Every day | ORAL | 3 refills | Status: DC

## 2017-04-23 NOTE — Telephone Encounter (Signed)
Discussed with pt.  Decrease Levothyroxine for elevated TSH from 75 mcgs to 50 mcgs.  Recheck in 3 months

## 2017-05-10 ENCOUNTER — Other Ambulatory Visit: Payer: Self-pay | Admitting: Family Medicine

## 2017-07-19 ENCOUNTER — Other Ambulatory Visit: Payer: Medicare Other

## 2017-07-19 DIAGNOSIS — E039 Hypothyroidism, unspecified: Secondary | ICD-10-CM

## 2017-07-20 LAB — TSH: TSH: 8.74 u[IU]/mL — AB (ref 0.450–4.500)

## 2017-07-22 ENCOUNTER — Telehealth: Payer: Self-pay | Admitting: Unknown Physician Specialty

## 2017-07-22 DIAGNOSIS — E038 Other specified hypothyroidism: Secondary | ICD-10-CM

## 2017-07-22 NOTE — Telephone Encounter (Signed)
Copied from CRM [phone]. Topic: Quick Communication - See Telephone Encounter >> Jul 22, 2017  2:06 PM Arlyss Gandyichardson, Khali Albanese N, NT wrote: CRM for notification. See Telephone encounter for: 07/22/17. Pt calling to receive a call with her lab results.

## 2017-07-23 MED ORDER — LEVOTHYROXINE SODIUM 75 MCG PO TABS: 75.0000 ug | ORAL_TABLET | Freq: Every day | ORAL | 0 refills | Status: DC

## 2017-07-23 NOTE — Telephone Encounter (Signed)
She is on mychart.  Please call and let her know the results as in mychart.  Let me know if there are any questions.

## 2017-07-23 NOTE — Telephone Encounter (Signed)
Please let her know that because of the fatigue, I will increase her back to the 75 mcgs dose.  Will need to be rechecked in 3 months.  Orders in chart.

## 2017-07-23 NOTE — Telephone Encounter (Signed)
Called and spoke to patient. I let her know lab results per [name]'s message. Patient states she is still tired a lot. States she needs whichever dose Kirsten MaxwellCheryl wants her to be on sent into the pharmacy because she is out of the medication.

## 2017-07-23 NOTE — Telephone Encounter (Signed)
Patient notified and verbalized understanding. 

## 2017-10-19 ENCOUNTER — Encounter: Payer: Self-pay | Admitting: Nurse Practitioner

## 2017-10-19 ENCOUNTER — Ambulatory Visit: Payer: Medicare Other | Admitting: Nurse Practitioner

## 2017-10-19 ENCOUNTER — Ambulatory Visit: Payer: Medicare Other | Admitting: Unknown Physician Specialty

## 2017-10-19 VITALS — BP 115/67 | HR 54 | Temp 98.4°F | Ht 59.0 in | Wt 119.1 lb

## 2017-10-19 DIAGNOSIS — E119 Type 2 diabetes mellitus without complications: Secondary | ICD-10-CM

## 2017-10-19 DIAGNOSIS — Z66 Do not resuscitate: Secondary | ICD-10-CM

## 2017-10-19 DIAGNOSIS — E038 Other specified hypothyroidism: Secondary | ICD-10-CM

## 2017-10-19 DIAGNOSIS — Z23 Encounter for immunization: Secondary | ICD-10-CM

## 2017-10-19 LAB — MICROALBUMIN, URINE WAIVED
Creatinine, Urine Waived: 200 mg/dL (ref 10–300)
Microalb, Ur Waived: 10 mg/L (ref 0–19)
Microalb/Creat Ratio: <30 mg/g (ref <30)

## 2017-10-19 LAB — BAYER DCA HB A1C WAIVED: HB A1C (BAYER DCA - WAIVED): 6.3 % (ref <7.0)

## 2017-10-19 NOTE — Assessment & Plan Note (Signed)
Has a living will.  Discussed with her do not resuscitate and artificial life support.  She does not want to be resuscitated and does not want any form of artificial life support (ventilator, dialysis, feeding tube).  Will fill out [place] this visit and MOST form next visit.

## 2017-10-19 NOTE — Progress Notes (Signed)
BP 115/67 (BP Location: Left Arm, Patient Position: Sitting, Cuff Size: Normal)   Pulse (!) 54   Temp 98.4 F (36.9 C)   Ht 4\' 11"  (1.499 m)   Wt 119 lb 2 oz (54 kg)   LMP  (LMP Unknown)   SpO2 97%   BMI 24.06 kg/m    Subjective:    Patient ID: Kirsten Riley, female    DOB: [birth year], 84 y.o.   MRN: [id number]  HPI: Kirsten Riley is a 84 y.o. female for follow-up T2DM and TSH.  DIABETES (chronic, diet-control) Hypoglycemic episodes:no Polydipsia/polyuria: no Visual disturbance: no Chest pain: no Paresthesias: no Glucose Monitoring: no  Accucheck frequency: Not Checking  Fasting glucose:  Post prandial:  Evening:  Before meals: Taking Insulin?: no  Long acting insulin:  Short acting insulin: Blood Pressure Monitoring: not checking Retinal Examination: Up to Date Foot Exam: Up to Date Diabetic Education: Completed Pneumovax: Up to Date Influenza: Up to Date Aspirin: yes   Hypothyroid: Chronic, ongoing.  Maintained on Levothyroxine.  Last visit they placed Levothyroxine back to original dose because patient reports when they switched it she was too tired.  Denies cold or heat intolerance, fatigue, or constipation.  States wishes to continue at current dose if possible, as she reports feeling "better" on this dose.  Discussed obtaining TSH today, which she agrees with.    Relevant past medical, surgical, family and social history reviewed and updated as indicated. Interim medical history since our last visit reviewed. Allergies and medications reviewed and updated.  Review of Systems  Constitutional: Negative for activity change, diaphoresis and fatigue.  Respiratory: Negative for cough, chest tightness and shortness of breath.   Cardiovascular: Negative for chest pain, palpitations and leg swelling.  Endocrine: Negative for cold intolerance, heat intolerance, polydipsia, polyphagia and polyuria.  Neurological: Negative for dizziness, light-headedness and headaches.    Psychiatric/Behavioral: Negative for behavioral problems.    Per HPI unless specifically indicated above     Objective:    BP 115/67 (BP Location: Left Arm, Patient Position: Sitting, Cuff Size: Normal)   Pulse (!) 54   Temp 98.4 F (36.9 C)   Ht 4\' 11"  (1.499 m)   Wt 119 lb 2 oz (54 kg)   LMP  (LMP Unknown)   SpO2 97%   BMI 24.06 kg/m   Wt Readings from Last 3 Encounters:  10/19/17 119 lb 2 oz (54 kg)  04/19/17 120 lb 8 oz (54.7 kg)  04/15/17 120 lb 1.6 oz (54.5 kg)    Physical Exam  Constitutional: She is oriented to person, place, and time. She appears well-developed and well-nourished.  HENT:  Head: Normocephalic and atraumatic.  Eyes: Pupils are equal, round, and reactive to light.  Cardiovascular: Normal rate, regular rhythm and normal heart sounds.  Pulmonary/Chest: Effort normal and breath sounds normal.  Abdominal: Soft. Bowel sounds are normal.  Neurological: She is alert and oriented to person, place, and time.  Skin: Skin is warm and dry.  Psychiatric: She has a normal mood and affect. Her behavior is normal.     Results for orders placed or performed in visit on 07/19/17  TSH  Result Value Ref Range   TSH 8.740 (H) 0.450 - 4.500 uIU/mL      Assessment & Plan:   Problem List Items Addressed This Visit      Endocrine   Hypothyroidism    Continue on Levothyroxine at current dose.  TSH today.  Last in July was 8.740.  Relevant Orders   Vitamin D (25 hydroxy)   TSH   Diabetes mellitus without complication (HCC)    Diet controlled.  Check A1C today 6.3% (remaining in current range).  Microalbumin 10.  Ratio <30.  Continue diet control.      Relevant Orders   Bayer DCA Hb A1c Waived (STAT)   B12   Microalbumin, Urine Waived (STAT)     Other   Do not resuscitate    Has a living will.  Discussed with her do not resuscitate and artificial life support.  She does not want to be resuscitated and does not want any form of artificial life support  (ventilator, dialysis, feeding tube).  Will fill out [place] [facility] this visit and MOST form next visit.      Relevant Orders   DNR (Do Not Resuscitate)    Other Visit Diagnoses    Immunization due    -  Primary   Relevant Orders   Flu vaccine HIGH DOSE PF (Fluzone High dose) (Completed)       Follow up plan: Return in about 6 months (around 04/20/2018) for physical and labs.

## 2017-10-19 NOTE — Assessment & Plan Note (Addendum)
Diet controlled.  Check A1C today 6.3% (remaining in current range).  Microalbumin 10.  Ratio <30.  Continue diet control.

## 2017-10-19 NOTE — Patient Instructions (Addendum)
Diabetes Mellitus and Nutrition When you have diabetes (diabetes mellitus), it is very important to have healthy eating habits because your blood sugar (glucose) levels are greatly affected by what you eat and drink. Eating healthy foods in the appropriate amounts, at about the same times every day, can help you:  Control your blood glucose.  Lower your risk of heart disease.  Improve your blood pressure.  Reach or maintain a healthy weight.  Every person with diabetes is different, and each person has different needs for a meal plan. Your health care provider may recommend that you work with a diet and nutrition specialist (dietitian) to make a meal plan that is best for you. Your meal plan may vary depending on factors such as:  The calories you need.  The medicines you take.  Your weight.  Your blood glucose, blood pressure, and cholesterol levels.  Your activity level.  Other health conditions you have, such as heart or kidney disease.  How do carbohydrates affect me? Carbohydrates affect your blood glucose level more than any other type of food. Eating carbohydrates naturally increases the amount of glucose in your blood. Carbohydrate counting is a method for keeping track of how many carbohydrates you eat. Counting carbohydrates is important to keep your blood glucose at a healthy level, especially if you use insulin or take certain oral diabetes medicines. It is important to know how many carbohydrates you can safely have in each meal. This is different for every person. Your dietitian can help you calculate how many carbohydrates you should have at each meal and for snack. Foods that contain carbohydrates include:  Bread, cereal, rice, pasta, and crackers.  Potatoes and corn.  Peas, beans, and lentils.  Milk and yogurt.  Fruit and juice.  Desserts, such as cakes, cookies, ice cream, and candy.  How does alcohol affect me? Alcohol can cause a sudden decrease in blood  glucose (hypoglycemia), especially if you use insulin or take certain oral diabetes medicines. Hypoglycemia can be a life-threatening condition. Symptoms of hypoglycemia (sleepiness, dizziness, and confusion) are similar to symptoms of having too much alcohol. If your health care provider says that alcohol is safe for you, follow these guidelines:  Limit alcohol intake to no more than 1 drink per day for nonpregnant women and 2 drinks per day for men. One drink equals 12 oz of beer, 5 oz of wine, or 1 oz of hard liquor.  Do not drink on an empty stomach.  Keep yourself hydrated with water, diet soda, or unsweetened iced tea.  Keep in mind that regular soda, juice, and other mixers may contain a lot of sugar and must be counted as carbohydrates.  What are tips for following this plan? Reading food labels  Start by checking the serving size on the label. The amount of calories, carbohydrates, fats, and other nutrients listed on the label are based on one serving of the food. Many foods contain more than one serving per package.  Check the total grams (g) of carbohydrates in one serving. You can calculate the number of servings of carbohydrates in one serving by dividing the total carbohydrates by 15. For example, if a food has 30 g of total carbohydrates, it would be equal to 2 servings of carbohydrates.  Check the number of grams (g) of saturated and trans fats in one serving. Choose foods that have low or no amount of these fats.  Check the number of milligrams (mg) of sodium in one serving. Most people   should limit total sodium intake to less than 2,300 mg per day.  Always check the nutrition information of foods labeled as "low-fat" or "nonfat". These foods may be higher in added sugar or refined carbohydrates and should be avoided.  Talk to your dietitian to identify your daily goals for nutrients listed on the label. Shopping  Avoid buying canned, premade, or processed foods. These  foods tend to be high in fat, sodium, and added sugar.  Shop around the outside edge of the grocery store. This includes fresh fruits and vegetables, bulk grains, fresh meats, and fresh dairy. Cooking  Use low-heat cooking methods, such as baking, instead of high-heat cooking methods like deep frying.  Cook using healthy oils, such as olive, canola, or sunflower oil.  Avoid cooking with butter, cream, or high-fat meats. Meal planning  Eat meals and snacks regularly, preferably at the same times every day. Avoid going long periods of time without eating.  Eat foods high in fiber, such as fresh fruits, vegetables, beans, and whole grains. Talk to your dietitian about how many servings of carbohydrates you can eat at each meal.  Eat 4-6 ounces of lean protein each day, such as lean meat, chicken, fish, eggs, or tofu. 1 ounce is equal to 1 ounce of meat, chicken, or fish, 1 egg, or 1/4 cup of tofu.  Eat some foods each day that contain healthy fats, such as avocado, nuts, seeds, and fish. Lifestyle   Check your blood glucose regularly.  Exercise at least 30 minutes 5 or more days each week, or as told by your health care provider.  Take medicines as told by your health care provider.  Do not use any products that contain nicotine or tobacco, such as cigarettes and e-cigarettes. If you need help quitting, ask your health care provider.  Work with a Veterinary surgeon or diabetes educator to identify strategies to manage stress and any emotional and social challenges. What are some questions to ask my health care provider?  Do I need to meet with a diabetes educator?  Do I need to meet with a dietitian?  What number can I call if I have questions?  When are the best times to check my blood glucose? Where to find more information:  American Diabetes Association: diabetes.org/food-and-fitness/food  Academy of Nutrition and Dietetics:  https://www.vargas.com/  [employer] of Diabetes and Digestive and Kidney Diseases (NIH): FindJewelers.cz Summary  A healthy meal plan will help you control your blood glucose and maintain a healthy lifestyle.  Working with a diet and nutrition specialist (dietitian) can help you make a meal plan that is best for you.  Keep in mind that carbohydrates and alcohol have immediate effects on your blood glucose levels. It is important to count carbohydrates and to use alcohol carefully. This information is not intended to replace advice given to you by your health care provider. Make sure you discuss any questions you have with your health care provider. Document Released: 09/25/2004 Document Revised: 02/03/2016 Document Reviewed: 02/03/2016 Elsevier Interactive Patient Education  2018 [employer]. Hypothyroidism Hypothyroidism is a disorder of the thyroid. The thyroid is a large gland that is located in the lower front of the neck. The thyroid releases hormones that control how the body works. With hypothyroidism, the thyroid does not make enough of these hormones. What are the causes? Causes of hypothyroidism may include:  Viral infections.  Pregnancy.  Your own defense system (immune system) attacking your thyroid.  Certain medicines.  Birth defects.  Past  radiation treatments to your head or neck.  Past treatment with radioactive iodine.  Past surgical removal of part or all of your thyroid.  Problems with the gland that is located in the center of your brain (pituitary).  What are the signs or symptoms? Signs and symptoms of hypothyroidism may include:  Feeling as though you have no energy (lethargy).  Inability to tolerate cold.  Weight gain that is not explained by a change in diet or exercise habits.  Dry skin.  Coarse hair.  Menstrual  irregularity.  Slowing of thought processes.  Constipation.  Sadness or depression.  How is this diagnosed? Your health care provider may diagnose hypothyroidism with blood tests and ultrasound tests. How is this treated? Hypothyroidism is treated with medicine that replaces the hormones that your body does not make. After you begin treatment, it may take several weeks for symptoms to go away. Follow these instructions at home:  Take medicines only as directed by your health care provider.  If you start taking any new medicines, tell your health care provider.  Keep all follow-up visits as directed by your health care provider. This is important. As your condition improves, your dosage needs may change. You will need to have blood tests regularly so that your health care provider can watch your condition. Contact a health care provider if:  Your symptoms do not get better with treatment.  You are taking thyroid replacement medicine and: ? You sweat excessively. ? You have tremors. ? You feel anxious. ? You lose weight rapidly. ? You cannot tolerate heat. ? You have emotional swings. ? You have diarrhea. ? You feel weak. Get help right away if:  You develop chest pain.  You develop an irregular heartbeat.  You develop a rapid heartbeat. This information is not intended to replace advice given to you by your health care provider. Make sure you discuss any questions you have with your health care provider. Document Released: 12/29/2004 Document Revised: 06/06/2015 Document Reviewed: 05/16/2013 Elsevier Interactive Patient Education  2018 [employer].  Advance Directive Advance directives are legal documents that let you make choices ahead of time about your health care and medical treatment in case you become unable to communicate for yourself. Advance directives are a way for you to communicate your wishes to family, friends, and health care providers. This can help convey  your decisions about end-of-life care if you become unable to communicate. Discussing and writing advance directives should happen over time rather than all at once. Advance directives can be changed depending on your situation and what you want, even after you have signed the advance directives. If you do not have an advance directive, some states assign family decision makers to act on your behalf based on how closely you are related to them. Each state has its own laws regarding advance directives. You may want to check with your health care provider, attorney, or state representative about the laws in your state. There are different types of advance directives, such as:  Medical power of attorney.  Living will.  Do not resuscitate (DNR) or do not attempt resuscitation (DNAR) order.  Health care proxy and medical power of attorney A health care proxy, also called a health care agent, is a person who is appointed to make medical decisions for you in cases in which you are unable to make the decisions yourself. Generally, people choose someone they know well and trust to represent their preferences. Make sure to ask this person  for an agreement to act as your proxy. A proxy may have to exercise judgment in the event of a medical decision for which your wishes are not known. A medical power of attorney is a legal document that names your health care proxy. Depending on the laws in your state, after the document is written, it may also need to be:  Signed.  Notarized.  Dated.  Copied.  Witnessed.  Incorporated into your medical record.  You may also want to appoint someone to manage your financial affairs in a situation in which you are unable to do so. This is called a durable power of attorney for finances. It is a separate legal document from the durable power of attorney for health care. You may choose the same person or someone different from your health care proxy to act as your agent  in financial matters. If you do not appoint a proxy, or if there is a concern that the proxy is not acting in your best interests, a court-appointed guardian may be designated to act on your behalf. Living will A living will is a set of instructions documenting your wishes about medical care when you cannot express them yourself. Health care providers should keep a copy of your living will in your medical record. You may want to give a copy to family members or friends. To alert caregivers in case of an emergency, you can place a card in your wallet to let them know that you have a living will and where they can find it. A living will is used if you become:  Terminally ill.  Incapacitated.  Unable to communicate or make decisions.  Items to consider in your living will include:  The use or non-use of life-sustaining equipment, such as dialysis machines and breathing machines (ventilators).  A DNR or DNAR order, which is the instruction not to use cardiopulmonary resuscitation (CPR) if breathing or heartbeat stops.  The use or non-use of tube feeding.  Withholding of food and fluids.  Comfort (palliative) care when the goal becomes comfort rather than a cure.  Organ and tissue donation.  A living will does not give instructions for distributing your money and property if you should pass away. It is recommended that you seek the advice of a lawyer when writing a will. Decisions about taxes, beneficiaries, and asset distribution will be legally binding. This process can relieve your family and friends of any concerns surrounding disputes or questions that may come up about the distribution of your assets. DNR or DNAR A DNR or DNAR order is a request not to have CPR in the event that your heart stops beating or you stop breathing. If a DNR or DNAR order has not been made and shared, a health care provider will try to help any patient whose heart has stopped or who has stopped breathing. If you  plan to have surgery, talk with your health care provider about how your DNR or DNAR order will be followed if problems occur. Summary  Advance directives are the legal documents that allow you to make choices ahead of time about your health care and medical treatment in case you become unable to communicate for yourself.  The process of discussing and writing advance directives should happen over time. You can change the advance directives, even after you have signed them.  Advance directives include DNR or DNAR orders, living wills, and designating an agent as your medical power of attorney. This information is not intended  to replace advice given to you by your health care provider. Make sure you discuss any questions you have with your health care provider. Document Released: 04/07/2007 Document Revised: 11/18/2015 Document Reviewed: 11/18/2015 Elsevier Interactive Patient Education  2017 [employer].

## 2017-10-19 NOTE — Assessment & Plan Note (Addendum)
Continue on Levothyroxine at current dose.  TSH today.  Last in July was 8.740.

## 2017-10-20 ENCOUNTER — Other Ambulatory Visit: Payer: Self-pay | Admitting: Nurse Practitioner

## 2017-10-20 DIAGNOSIS — E038 Other specified hypothyroidism: Secondary | ICD-10-CM

## 2017-10-20 LAB — TSH: TSH: 0.236 u[IU]/mL — AB (ref 0.450–4.500)

## 2017-10-20 LAB — VITAMIN D 25 HYDROXY (VIT D DEFICIENCY, FRACTURES): Vit D, 25-Hydroxy: 38.5 ng/mL (ref 30.0–100.0)

## 2017-10-20 LAB — VITAMIN B12: Vitamin B-12: 1189 pg/mL (ref 232–1245)

## 2017-10-20 MED ORDER — LEVOTHYROXINE SODIUM 50 MCG PO TABS: 50.0000 ug | ORAL_TABLET | Freq: Every day | ORAL | 2 refills | Status: DC

## 2017-10-20 NOTE — Progress Notes (Unsigned)
TSH on 10/20/17 0.236.  On Levothyroxine daily, will decrease this to daily and notify patient of change.  Have her follow-up in six to eight weeks  for repeat TSH with dose change.

## 2017-11-15 ENCOUNTER — Other Ambulatory Visit: Payer: Self-pay | Admitting: Nurse Practitioner

## 2017-11-15 ENCOUNTER — Telehealth: Payer: Self-pay | Admitting: Nurse Practitioner

## 2017-11-15 DIAGNOSIS — E038 Other specified hypothyroidism: Secondary | ICD-10-CM

## 2017-11-15 MED ORDER — LEVOTHYROXINE SODIUM 50 MCG PO TABS: 50.0000 ug | ORAL_TABLET | Freq: Every day | ORAL | 3 refills | Status: DC

## 2017-11-15 NOTE — Progress Notes (Signed)
Patient request refill on Levothyroxine.  Refill request ordered with refills applied until next appointment and TSH check.

## 2017-11-15 NOTE — Telephone Encounter (Signed)
Refill sent to pharmacy.   

## 2017-11-15 NOTE — Telephone Encounter (Unsigned)
Copied from CRM [id number]. Topic: Quick Communication - Rx Refill/Question >> Nov 15, 2017  8:37 AM Gaynelle Adu wrote: Medication: levothyroxine (SYNTHROID, LEVOTHROID) 50 MCG tablet     Has the patient contacted their pharmacy?   Preferred Pharmacy (with phone number or street name): [facility] - [place], [place] - [address] [phone] (Phone) [phone] (Fax)    Agent: Please be advised that RX refills may take up to 3 business days. We ask that you follow-up with your pharmacy.

## 2017-12-06 ENCOUNTER — Other Ambulatory Visit: Payer: Self-pay

## 2017-12-06 ENCOUNTER — Encounter: Payer: Self-pay | Admitting: Nurse Practitioner

## 2017-12-06 ENCOUNTER — Ambulatory Visit (INDEPENDENT_AMBULATORY_CARE_PROVIDER_SITE_OTHER): Payer: Medicare Other | Admitting: Nurse Practitioner

## 2017-12-06 DIAGNOSIS — E038 Other specified hypothyroidism: Secondary | ICD-10-CM | POA: Diagnosis not present

## 2017-12-06 NOTE — Patient Instructions (Signed)
Hypothyroidism Hypothyroidism is a disorder of the thyroid. The thyroid is a large gland that is located in the lower front of the neck. The thyroid releases hormones that control how the body works. With hypothyroidism, the thyroid does not make enough of these hormones. What are the causes? Causes of hypothyroidism may include:  Viral infections.  Pregnancy.  Your own defense system (immune system) attacking your thyroid.  Certain medicines.  Birth defects.  Past radiation treatments to your head or neck.  Past treatment with radioactive iodine.  Past surgical removal of part or all of your thyroid.  Problems with the gland that is located in the center of your brain (pituitary).  What are the signs or symptoms? Signs and symptoms of hypothyroidism may include:  Feeling as though you have no energy (lethargy).  Inability to tolerate cold.  Weight gain that is not explained by a change in diet or exercise habits.  Dry skin.  Coarse hair.  Menstrual irregularity.  Slowing of thought processes.  Constipation.  Sadness or depression.  How is this diagnosed? Your health care provider may diagnose hypothyroidism with blood tests and ultrasound tests. How is this treated? Hypothyroidism is treated with medicine that replaces the hormones that your body does not make. After you begin treatment, it may take several weeks for symptoms to go away. Follow these instructions at home:  Take medicines only as directed by your health care provider.  If you start taking any new medicines, tell your health care provider.  Keep all follow-up visits as directed by your health care provider. This is important. As your condition improves, your dosage needs may change. You will need to have blood tests regularly so that your health care provider can watch your condition. Contact a health care provider if:  Your symptoms do not get better with treatment.  You are taking thyroid  replacement medicine and: ? You sweat excessively. ? You have tremors. ? You feel anxious. ? You lose weight rapidly. ? You cannot tolerate heat. ? You have emotional swings. ? You have diarrhea. ? You feel weak. Get help right away if:  You develop chest pain.  You develop an irregular heartbeat.  You develop a rapid heartbeat. This information is not intended to replace advice given to you by your health care provider. Make sure you discuss any questions you have with your health care provider. Document Released: 12/29/2004 Document Revised: 06/06/2015 Document Reviewed: 05/16/2013 Elsevier Interactive Patient Education  2018 [employer]vier Inc.  

## 2017-12-06 NOTE — Assessment & Plan Note (Addendum)
Dose change to 500 MCG as October TSH 0.236.  Repeat TSH today and adjust dose as needed.

## 2017-12-06 NOTE — Progress Notes (Signed)
BP 136/70 (BP Location: Left Arm, Patient Position: Sitting)   Pulse (!) 56   Temp (!) 97.4 F (36.3 C) (Oral)   Ht 4\' 11"  (1.499 m)   Wt 123 lb (55.8 kg)   LMP  (LMP Unknown)   SpO2 99%   BMI 24.84 kg/m    Subjective:    Patient ID: Kirsten Riley, female    DOB: 09/10/1931, 84 y.o.   MRN: [id number]  HPI: Kirsten Riley is a 84 y.o. female presents for hypothyroid with recent dose changes.    Chief Complaint  Patient presents with  . Hypothyroidism    f/u   HYPOTHYROIDISM Decreased dose to 500 MCG QDAY at last visit due to TSH 0.236.  She reports no changes or symptoms with dose change. Thyroid control status:controlled Satisfied with current treatment? yes Medication side effects: no Medication compliance: excellent compliance Etiology of hypothyroidism:  Recent dose adjustment:yes Fatigue: no Cold intolerance: no Heat intolerance: no Weight gain: no Weight loss: no Constipation: no Diarrhea/loose stools: no Palpitations: no Lower extremity edema: no Anxiety/depressed mood: no  Relevant past medical, surgical, family and social history reviewed and updated as indicated. Interim medical history since our last visit reviewed. Allergies and medications reviewed and updated.  Review of Systems  Constitutional: Negative for activity change, appetite change and fatigue.  Respiratory: Negative for cough, chest tightness and shortness of breath.   Cardiovascular: Negative for chest pain, palpitations and leg swelling.  Gastrointestinal: Negative for abdominal distention, abdominal pain, constipation, diarrhea, nausea and vomiting.  Musculoskeletal: Negative.   Skin: Negative.   Neurological: Negative for dizziness, syncope, weakness, light-headedness, numbness and headaches.  Psychiatric/Behavioral: Negative.     Per HPI unless specifically indicated above     Objective:    BP 136/70 (BP Location: Left Arm, Patient Position: Sitting)   Pulse (!) 56   Temp (!) 97.4  F (36.3 C) (Oral)   Ht 4\' 11"  (1.499 m)   Wt 123 lb (55.8 kg)   LMP  (LMP Unknown)   SpO2 99%   BMI 24.84 kg/m   Wt Readings from Last 3 Encounters:  12/06/17 123 lb (55.8 kg)  10/19/17 119 lb 2 oz (54 kg)  04/19/17 120 lb 8 oz (54.7 kg)    Physical Exam  Constitutional: She is oriented to person, place, and time. She appears well-developed and well-nourished.  HENT:  Head: Normocephalic.  Eyes: Pupils are equal, round, and reactive to light. Conjunctivae and EOM are normal. Right eye exhibits no discharge. Left eye exhibits no discharge.  Neck: Normal range of motion. Neck supple. No JVD present. Carotid bruit is not present. No thyromegaly present.  Cardiovascular: Normal rate, regular rhythm and normal heart sounds.  Pulmonary/Chest: Effort normal and breath sounds normal.  Abdominal: Soft. Bowel sounds are normal.  Lymphadenopathy:    She has no cervical adenopathy.  Neurological: She is alert and oriented to person, place, and time.  Skin: Skin is warm and dry.  Psychiatric: She has a normal mood and affect. Her behavior is normal. Judgment and thought content normal.  Nursing note and vitals reviewed.   Results for orders placed or performed in visit on 10/19/17  Bayer DCA Hb A1c Waived (STAT)  Result Value Ref Range   HB A1C (BAYER DCA - WAIVED) 6.3 <7.0 %  Vitamin D (25 hydroxy)  Result Value Ref Range   Vit D, 25-Hydroxy 38.5 30.0 - 100.0 ng/mL  B12  Result Value Ref Range   Vitamin B-12  1,189 232 - 1,245 pg/mL  TSH  Result Value Ref Range   TSH 0.236 (L) 0.450 - 4.500 uIU/mL  Microalbumin, Urine Waived (STAT)  Result Value Ref Range   Microalb, Ur Waived 10 0 - 19 mg/L   Creatinine, Urine Waived 200 10 - 300 mg/dL   Microalb/Creat Ratio <30 <30 mg/g      Assessment & Plan:   Problem List Items Addressed This Visit      Endocrine   Hypothyroidism    Dose change to 500 MCG as October TSH 0.236.  Repeat TSH today and adjust dose as needed.       Relevant Orders   Thyroid Panel With TSH       Follow up plan: Return in about 5 months (around 05/07/2018) for T2DM and Hypothyroid.

## 2017-12-07 ENCOUNTER — Other Ambulatory Visit: Payer: Self-pay | Admitting: Nurse Practitioner

## 2017-12-07 DIAGNOSIS — E038 Other specified hypothyroidism: Secondary | ICD-10-CM

## 2017-12-07 LAB — THYROID PANEL WITH TSH
FREE THYROXINE INDEX: 2 (ref 1.2–4.9)
T3 UPTAKE RATIO: 26 % (ref 24–39)
T4, Total: 7.7 ug/dL (ref 4.5–12.0)
TSH: 6.81 u[IU]/mL — AB (ref 0.450–4.500)

## 2017-12-07 MED ORDER — LEVOTHYROXINE SODIUM 75 MCG PO TABS: 75.0000 ug | ORAL_TABLET | Freq: Every day | ORAL | 3 refills | Status: DC

## 2017-12-07 NOTE — Progress Notes (Signed)
TSH level returned.  Previous TSH 0.236, at time was on 75 MCG Levothyroxine and this was decreased to 50 MCG.  Recheck yesterday of TSH level 6.810.  Per ATA guidelines goal for age is <6 for TSH. Will increase Levothyroxine to 75 MCG every morning, 30 minutes prior to meal or other medications.  Will have patient come in 6 weeks for lab check only.  Will notify patient of lab results and med change via MyChart and telephone.

## 2017-12-13 LAB — HM DIABETES EYE EXAM

## 2018-02-09 ENCOUNTER — Other Ambulatory Visit: Payer: Self-pay

## 2018-02-09 MED ORDER — LOVASTATIN 40 MG PO TABS: 40.0000 mg | ORAL_TABLET | Freq: Every day | ORAL | 3 refills | Status: DC

## 2018-02-09 NOTE — Telephone Encounter (Signed)
Refill request approved Lovastatin

## 2018-02-09 NOTE — Telephone Encounter (Signed)
Patient last seen 11/26/17 and has f/up 04/29/18.

## 2018-04-12 ENCOUNTER — Telehealth: Payer: Self-pay

## 2018-04-12 NOTE — Telephone Encounter (Signed)
Patient scheduled for an AWV on 04/13/2018 with [facility], Due to Covid-19 pandemic this is unable to be done in office, called patient to see if she was able to do this virtually. Patient unable to access any video call applications. She prefers to reschedule for an in office visit. Rescheduled awv and cpe, patient verified change and will also receive on my chart.  

## 2018-04-21 ENCOUNTER — Ambulatory Visit: Payer: Self-pay

## 2018-04-22 ENCOUNTER — Ambulatory Visit: Payer: Self-pay

## 2018-04-29 ENCOUNTER — Encounter: Payer: Medicare Other | Admitting: Nurse Practitioner

## 2018-05-12 ENCOUNTER — Ambulatory Visit: Payer: Medicare Other | Admitting: Nurse Practitioner

## 2018-06-16 ENCOUNTER — Ambulatory Visit (INDEPENDENT_AMBULATORY_CARE_PROVIDER_SITE_OTHER): Payer: Medicare Other

## 2018-06-16 ENCOUNTER — Encounter: Payer: Self-pay | Admitting: Nurse Practitioner

## 2018-06-16 ENCOUNTER — Other Ambulatory Visit: Payer: Self-pay

## 2018-06-16 ENCOUNTER — Ambulatory Visit (INDEPENDENT_AMBULATORY_CARE_PROVIDER_SITE_OTHER): Payer: Medicare Other | Admitting: Nurse Practitioner

## 2018-06-16 VITALS — Ht 60.0 in | Wt 118.0 lb

## 2018-06-16 VITALS — Wt 118.0 lb

## 2018-06-16 DIAGNOSIS — E7800 Pure hypercholesterolemia, unspecified: Secondary | ICD-10-CM | POA: Diagnosis not present

## 2018-06-16 DIAGNOSIS — E119 Type 2 diabetes mellitus without complications: Secondary | ICD-10-CM | POA: Diagnosis not present

## 2018-06-16 DIAGNOSIS — Z0000 Encounter for general adult medical examination without abnormal findings: Secondary | ICD-10-CM | POA: Diagnosis not present

## 2018-06-16 DIAGNOSIS — E038 Other specified hypothyroidism: Secondary | ICD-10-CM

## 2018-06-16 DIAGNOSIS — M8580 Other specified disorders of bone density and structure, unspecified site: Secondary | ICD-10-CM | POA: Diagnosis not present

## 2018-06-16 NOTE — Assessment & Plan Note (Signed)
Chronic, ongoing.  Continue current medication regimen and adjust as needed, will obtain outpatient labs.   

## 2018-06-16 NOTE — Progress Notes (Signed)
Wt 118 lb (53.5 kg)   LMP  (LMP Unknown)   BMI 23.83 kg/m    Subjective:    Patient ID: Kirsten Riley, female    DOB: 1931/07/09, 85 y.o.   MRN: [id number]  HPI: Kirsten Riley is a 85 y.o. female  Chief Complaint  Patient presents with  . Diabetes  . Hypothyroidism  . Hyperlipidemia    . This visit was completed via telephone due to the restrictions of the COVID-19 pandemic. All issues as above were discussed and addressed but no physical exam was performed. If it was felt that the patient should be evaluated in the office, they were directed there. The patient verbally consented to this visit. Patient was unable to complete an audio/visual visit due to Lack of equipment. Due to the catastrophic nature of the COVID-19 pandemic, this visit was done through audio contact only. . Location of the patient: home . Location of the provider: home . Those involved with this call:  . Provider: Aura Dials, DNP . CMA: Sheilah Mins, CMA . Front Desk/Registration: Harriet Pho  . Time spent on call: 15 minutes on the phone discussing health concerns. 10 minutes total spent in review of patient's record and preparation of their chart. I verified patient identity using two factors (patient name and date of birth). Patient consents verbally to being seen via telemedicine visit today.   DIABETES Diet-controlled.  Last A1C in October 2019 was 6.3.   Hypoglycemic episodes:no Polydipsia/polyuria: no Visual disturbance: no Chest pain: no Paresthesias: no Glucose Monitoring: no  Accucheck frequency: Not Checking  Fasting glucose:  Post prandial:  Evening:  Before meals: Taking Insulin?: no  Long acting insulin:  Short acting insulin: Blood Pressure Monitoring: not checking Retinal Examination: Up To Date Foot Exam: Not up to Date Pneumovax: Up to Date Influenza: Up to Date Aspirin: no   HYPOTHYROIDISM On Levothyroxine 75 MCG daily.  Last TSH November 2019 6.810 with WNL T4 at 7.7.  Thyroid control status:stable Satisfied with current treatment? no Medication side effects: no Medication compliance: good compliance Etiology of hypothyroidism: unknown Recent dose adjustment:no Fatigue: no Cold intolerance: no Heat intolerance: no Weight gain: no Weight loss: no Constipation: no Diarrhea/loose stools: no Palpitations: no Lower extremity edema: no Anxiety/depressed mood: no   HYPERLIPIDEMIA Continues on Lovastatin 40 MG daily. Hyperlipidemia status: good compliance Satisfied with current treatment?  yes Side effects:  no Medication compliance: good compliance Past cholesterol meds: lovastatin Supplements: none Aspirin:  no The ASCVD Risk score Denman George DC Jr., et al., 2013) failed to calculate for the following reasons:   The 2013 ASCVD risk score is only valid for ages 52 to 34 Chest pain:  no Coronary artery disease:  no Family history CAD:  no Family history early CAD:  no  Relevant past medical, surgical, family and social history reviewed and updated as indicated. Interim medical history since our last visit reviewed. Allergies and medications reviewed and updated.  Review of Systems  Constitutional: Negative for activity change, appetite change, diaphoresis, fatigue and fever.  Respiratory: Negative for cough, chest tightness and shortness of breath.   Cardiovascular: Negative for chest pain, palpitations and leg swelling.  Gastrointestinal: Negative for abdominal distention, abdominal pain, constipation, diarrhea, nausea and vomiting.  Endocrine: Negative for cold intolerance, heat intolerance, polydipsia, polyphagia and polyuria.  Neurological: Negative for dizziness, syncope, weakness, light-headedness, numbness and headaches.  Psychiatric/Behavioral: Negative.     Per HPI unless specifically indicated above     Objective:  Wt 118 lb (53.5 kg)   LMP  (LMP Unknown)   BMI 23.83 kg/m   Wt Readings from Last 3 Encounters:  06/16/18 118 lb  (53.5 kg)  12/06/17 123 lb (55.8 kg)  10/19/17 119 lb 2 oz (54 kg)    Physical Exam   Unable to perform due to telephone visit only.  Results for orders placed or performed in visit on 12/13/17  HM DIABETES EYE EXAM  Result Value Ref Range   HM Diabetic Eye Exam No Retinopathy No Retinopathy      Assessment & Plan:   Problem List Items Addressed This Visit      Endocrine   Hypothyroidism    Chronic, ongoing.  Continue current medication regimen and adjust as needed, will obtain outpatient labs.        Relevant Orders   Thyroid Panel With TSH   Diabetes mellitus without complication (HCC) - Primary    Chronic, stable, diet-controlled.  Obtain outpatient A1C and urine.  Continue current plan of care and adjust as needed based on labs.      Relevant Orders   Bayer DCA Hb A1c Waived   Microalbumin, Urine Waived     Musculoskeletal and Integument   Osteopenia   Relevant Orders   VITAMIN D 25 Hydroxy (Vit-D Deficiency, Fractures)     Other   Hyperlipidemia    Chronic, ongoing.  Continue current medication regimen and adjust as needed, will obtain outpatient labs.        Relevant Orders   Lipid Panel Piccolo, Waived   Comprehensive metabolic panel      I discussed the assessment and treatment plan with the patient. The patient was provided an opportunity to ask questions and all were answered. The patient agreed with the plan and demonstrated an understanding of the instructions.   The patient was advised to call back or seek an in-person evaluation if the symptoms worsen or if the condition fails to improve as anticipated.   I provided 15 minutes of time during this encounter.  Follow up plan: Return in about 3 months (around 09/16/2018) for T2DM, HLD, Hypothyroid.

## 2018-06-16 NOTE — Patient Instructions (Signed)
Kirsten Riley , Thank you for taking time to come for your Medicare Wellness Visit. I appreciate your ongoing commitment to your health goals. Please review the following plan we discussed and let me know if I can assist you in the future.   Screening recommendations/referrals: Colonoscopy: no longer required Mammogram: no longer required Bone Density: not indicated  Recommended yearly ophthalmology/optometry visit for glaucoma screening and checkup Recommended yearly dental visit for hygiene and checkup  Vaccinations: Influenza vaccine: up to date Pneumococcal vaccine: up to date Tdap vaccine: up to date Shingles vaccine: shingrix eligible, check with your insuance company for coverage    Advanced directives: Please bring a copy of your health care power of attorney and living will to the office at your convenience.  Conditions/risks identified: fall preventions discussed  Next appointment: follow up in one year for your annual wellness exam.    Preventive Care 65 Years and Older, Female Preventive care refers to lifestyle choices and visits with your health care provider that can promote health and wellness. What does preventive care include?  A yearly physical exam. This is also called an annual well check.  Dental exams once or twice a year.  Routine eye exams. Ask your health care provider how often you should have your eyes checked.  Personal lifestyle choices, including:  Daily care of your teeth and gums.  Regular physical activity.  Eating a healthy diet.  Avoiding tobacco and drug use.  Limiting alcohol use.  Practicing safe sex.  Taking low-dose aspirin every day.  Taking vitamin and mineral supplements as recommended by your health care provider. What happens during an annual well check? The services and screenings done by your health care provider during your annual well check will depend on your age, overall health, lifestyle risk factors, and family history  of disease. Counseling  Your health care provider may ask you questions about your:  Alcohol use.  Tobacco use.  Drug use.  Emotional well-being.  Home and relationship well-being.  Sexual activity.  Eating habits.  History of falls.  Memory and ability to understand (cognition).  Work and work Astronomer.  Reproductive health. Screening  You may have the following tests or measurements:  Height, weight, and BMI.  Blood pressure.  Lipid and cholesterol levels. These may be checked every 5 years, or more frequently if you are over 21 years old.  Skin check.  Lung cancer screening. You may have this screening every year starting at age 40 if you have a 30-pack-year history of smoking and currently smoke or have quit within the past 15 years.  Fecal occult blood test (FOBT) of the stool. You may have this test every year starting at age 51.  Flexible sigmoidoscopy or colonoscopy. You may have a sigmoidoscopy every 5 years or a colonoscopy every 10 years starting at age 39.  Hepatitis C blood test.  Hepatitis B blood test.  Sexually transmitted disease (STD) testing.  Diabetes screening. This is done by checking your blood sugar (glucose) after you have not eaten for a while (fasting). You may have this done every 1-3 years.  Bone density scan. This is done to screen for osteoporosis. You may have this done starting at age 32.  Mammogram. This may be done every 1-2 years. Talk to your health care provider about how often you should have regular mammograms. Talk with your health care provider about your test results, treatment options, and if necessary, the need for more tests. Vaccines  Your health  care provider may recommend certain vaccines, such as:  Influenza vaccine. This is recommended every year.  Tetanus, diphtheria, and acellular pertussis (Tdap, Td) vaccine. You may need a Td booster every 10 years.  Zoster vaccine. You may need this after age 18.   Pneumococcal 13-valent conjugate (PCV13) vaccine. One dose is recommended after age 36.  Pneumococcal polysaccharide (PPSV23) vaccine. One dose is recommended after age 13. Talk to your health care provider about which screenings and vaccines you need and how often you need them. This information is not intended to replace advice given to you by your health care provider. Make sure you discuss any questions you have with your health care provider. Document Released: 01/25/2015 Document Revised: 09/18/2015 Document Reviewed: 10/30/2014 Elsevier Interactive Patient Education  2017 [place] Prevention in the Home Falls can cause injuries. They can happen to people of all ages. There are many things you can do to make your home safe and to help prevent falls. What can I do on the outside of my home?  Regularly fix the edges of walkways and driveways and fix any cracks.  Remove anything that might make you trip as you walk through a door, such as a raised step or threshold.  Trim any bushes or trees on the path to your home.  Use bright outdoor lighting.  Clear any walking paths of anything that might make someone trip, such as rocks or tools.  Regularly check to see if handrails are loose or broken. Make sure that both sides of any steps have handrails.  Any raised decks and porches should have guardrails on the edges.  Have any leaves, snow, or ice cleared regularly.  Use sand or salt on walking paths during winter.  Clean up any spills in your garage right away. This includes oil or grease spills. What can I do in the bathroom?  Use night lights.  Install grab bars by the toilet and in the tub and shower. Do not use towel bars as grab bars.  Use non-skid mats or decals in the tub or shower.  If you need to sit down in the shower, use a plastic, non-slip stool.  Keep the floor dry. Clean up any water that spills on the floor as soon as it happens.  Remove soap  buildup in the tub or shower regularly.  Attach bath mats securely with double-sided non-slip rug tape.  Do not have throw rugs and other things on the floor that can make you trip. What can I do in the bedroom?  Use night lights.  Make sure that you have a light by your bed that is easy to reach.  Do not use any sheets or blankets that are too big for your bed. They should not hang down onto the floor.  Have a firm chair that has side arms. You can use this for support while you get dressed.  Do not have throw rugs and other things on the floor that can make you trip. What can I do in the kitchen?  Clean up any spills right away.  Avoid walking on wet floors.  Keep items that you use a lot in easy-to-reach places.  If you need to reach something above you, use a strong step stool that has a grab bar.  Keep electrical cords out of the way.  Do not use floor polish or wax that makes floors slippery. If you must use wax, use non-skid floor wax.  Do not have  throw rugs and other things on the floor that can make you trip. What can I do with my stairs?  Do not leave any items on the stairs.  Make sure that there are handrails on both sides of the stairs and use them. Fix handrails that are broken or loose. Make sure that handrails are as long as the stairways.  Check any carpeting to make sure that it is firmly attached to the stairs. Fix any carpet that is loose or worn.  Avoid having throw rugs at the top or bottom of the stairs. If you do have throw rugs, attach them to the floor with carpet tape.  Make sure that you have a light switch at the top of the stairs and the bottom of the stairs. If you do not have them, ask someone to add them for you. What else can I do to help prevent falls?  Wear shoes that:  Do not have high heels.  Have rubber bottoms.  Are comfortable and fit you well.  Are closed at the toe. Do not wear sandals.  If you use a stepladder:  Make  sure that it is fully opened. Do not climb a closed stepladder.  Make sure that both sides of the stepladder are locked into place.  Ask someone to hold it for you, if possible.  Clearly mark and make sure that you can see:  Any grab bars or handrails.  First and last steps.  Where the edge of each step is.  Use tools that help you move around (mobility aids) if they are needed. These include:  Canes.  Walkers.  Scooters.  Crutches.  Turn on the lights when you go into a dark area. Replace any light bulbs as soon as they burn out.  Set up your furniture so you have a clear path. Avoid moving your furniture around.  If any of your floors are uneven, fix them.  If there are any pets around you, be aware of where they are.  Review your medicines with your doctor. Some medicines can make you feel dizzy. This can increase your chance of falling. Ask your doctor what other things that you can do to help prevent falls. This information is not intended to replace advice given to you by your health care provider. Make sure you discuss any questions you have with your health care provider. Document Released: 10/25/2008 Document Revised: 06/06/2015 Document Reviewed: 02/02/2014 Elsevier Interactive Patient Education  2017 [employer].

## 2018-06-16 NOTE — Assessment & Plan Note (Signed)
Chronic, stable, diet-controlled.  Obtain outpatient A1C and urine.  Continue current plan of care and adjust as needed based on labs.

## 2018-06-16 NOTE — Progress Notes (Signed)
Subjective:   Kirsten Riley is a 85 y.o. female who presents for Medicare Annual (Subsequent) preventive examination.  This visit is being conducted via phone call  - after an attmept to do on video chat - due to the COVID-19 pandemic. This patient has given me verbal consent via phone to conduct this visit, patient states they are participating from their home address. Some vital signs may be absent or patient reported.   Patient identification: identified by name, DOB, and current address.    Review of Systems:   Cardiac Risk Factors include: advanced age (>17men, >48 women);hypertension;dyslipidemia     Objective:     Vitals: Ht 5' (1.524 m)   Wt 118 lb (53.5 kg) Comment: patient reported  LMP  (LMP Unknown)   BMI 23.05 kg/m   Body mass index is 23.05 kg/m.  Advanced Directives 06/16/2018 04/15/2016 01/09/2015  Does Patient Have a Medical Advance Directive? Yes Yes No  Type of Advance Directive Living will;Healthcare Power of [employer] Power of [place];Living will -  Copy of Healthcare Power of Attorney in Chart? No - copy requested No - copy requested -    Tobacco Social History   Tobacco Use  Smoking Status Never Smoker  Smokeless Tobacco Never Used     Counseling given: Not Answered   Clinical Intake:  Pre-visit preparation completed: Yes  Pain : No/denies pain     Nutritional Status: BMI of 19-24  Normal Nutritional Risks: None Diabetes: No  How often do you need to have someone help you when you read instructions, pamphlets, or other written materials from your doctor or pharmacy?: 1 - Never What is the last grade level you completed in school?: 12th grade  Interpreter Needed?: No  Information entered by ::  ,LPN   Past Medical History:  Diagnosis Date  . CAD (coronary artery disease)   . Diabetes mellitus (HCC) 12/10/2014  . Fractured rib   . Hyperlipidemia   . Hypertension   . Hypothyroidism   . Polymyalgia rheumatica  (HCC) 06/08/2014   Past Surgical History:  Procedure Laterality Date  . ABDOMINAL HYSTERECTOMY  1978   partial  . CORONARY ARTERY BYPASS GRAFT    . hypercholesterol    . hypothyroid     Family History  Problem Relation Age of Onset  . Heart disease Mother   . Stroke Mother   . Hypertension Mother   . Cancer Father   . Cancer Sister        breast, ovarian, pancreatic  . Heart disease Sister        x2  . Cancer Brother   . Heart disease Brother   . Cancer Brother   . Heart disease Brother   . Cancer Brother   . Heart disease Brother    Social History   Socioeconomic History  . Marital status: Widowed    Spouse name: Not on file  . Number of children: Not on file  . Years of education: Not on file  . Highest education level: 12th grade  Occupational History  . Not on file  Social Needs  . Financial resource strain: Not hard at all  . Food insecurity:    Worry: Never true    Inability: Never true  . Transportation needs:    Medical: No    Non-medical: No  Tobacco Use  . Smoking status: Never Smoker  . Smokeless tobacco: Never Used  Substance and Sexual Activity  . Alcohol use: No  Alcohol/week: 0.0 standard drinks  . Drug use: No  . Sexual activity: Never  Lifestyle  . Physical activity:    Days per week: 0 days    Minutes per session: 0 min  . Stress: Not at all  Relationships  . Social connections:    Talks on phone: More than three times a week    Gets together: More than three times a week    Attends religious service: More than 4 times per year    Active member of club or organization: No    Attends meetings of clubs or organizations: Never    Relationship status: Divorced  Other Topics Concern  . Not on file  Social History Narrative  . Not on file    Outpatient Encounter Medications as of 06/16/2018  Medication Sig  . alendronate (FOSAMAX) 70 MG tablet Take 70 mg by mouth once a week.  . calcium-vitamin D 250-100 MG-UNIT per tablet Take 1  tablet by mouth 2 (two) times daily.  . folic acid (FOLVITE) 1 MG tablet Take by mouth.  . levothyroxine (SYNTHROID, LEVOTHROID) 75 MCG tablet Take 1 tablet (75 mcg total) by mouth daily.  Marland Kitchen lovastatin (MEVACOR) 40 MG tablet Take 1 tablet (40 mg total) by mouth at bedtime.  . methotrexate (RHEUMATREX) 2.5 MG tablet Take 5 tablets by mouth once a week.   . Multiple Vitamin (MULTIVITAMIN WITH MINERALS) TABS tablet Take 1 tablet by mouth daily.   No facility-administered encounter medications on file as of 06/16/2018.     Activities of Daily Living In your present state of health, do you have any difficulty performing the following activities: 06/16/2018 06/16/2018  Hearing? N N  Vision? N N  Difficulty concentrating or making decisions? N N  Walking or climbing stairs? N N  Dressing or bathing? N N  Doing errands, shopping? N N  Preparing Food and eating ? N -  Using the Toilet? N -  In the past six months, have you accidently leaked urine? N -  Do you have problems with loss of bowel control? N -  Managing your Medications? N -  Managing your Finances? N -  Housekeeping or managing your Housekeeping? N -  Some recent data might be hidden    Patient Care Team: Marjie Skiff, NP as PCP - General (Nurse Practitioner) Kandyce Rud., MD (Rheumatology)    Assessment:   This is a routine wellness examination for Kirsten Riley.  Exercise Activities and Dietary recommendations Current Exercise Habits: Home exercise routine, Type of exercise: walking;stretching, Time (Minutes): 20, Frequency (Times/Week): 5, Weekly Exercise (Minutes/Week): 100, Intensity: Mild, Exercise limited by: None identified  Goals    . DIET - INCREASE WATER INTAKE     Recommend drinking at least 6-8 glasses of water a day        Fall Risk: Fall Risk  06/16/2018 04/15/2017 04/15/2016 10/18/2015 03/12/2015  Falls in the past year? 0 No No No Yes  Number falls in past yr: 0 - - - 1  Injury with Fall? 1 - - - Yes   Comment - - - - pt states she broke her 8th rib during fall, but states she is doing good    FALL RISK PREVENTION PERTAINING TO THE HOME:  Any stairs in or around the home? Yes  If so, are there any without handrails? No   Home free of loose throw rugs in walkways, pet beds, electrical cords, etc? Yes  Adequate lighting in your home to  reduce risk of falls? Yes   ASSISTIVE DEVICES UTILIZED TO PREVENT FALLS:  Life alert? Yes  Use of a cane, walker or w/c? No  Grab bars in the bathroom? Yes  Shower chair or bench in shower? Yes  Elevated toilet seat or a handicapped toilet? No   DME ORDERS:  DME order needed?  No   TIMED UP AND GO:  Unable to perform    Depression Screen PHQ 2/9 Scores 06/16/2018 04/15/2017 04/15/2016 10/18/2015  PHQ - 2 Score 0 0 0 0  PHQ- 9 Score - - 0 -     Cognitive Function     6CIT Screen 06/16/2018 04/15/2017  What Year? 0 points 0 points  What month? 0 points 0 points  What time? 0 points 0 points  Count back from 20 0 points 0 points  Months in reverse 0 points 0 points  Repeat phrase 0 points 0 points  Total Score 0 0    Immunization History  Administered Date(s) Administered  . Influenza, High Dose Seasonal PF 10/18/2015, 10/16/2016, 10/19/2017  . Influenza,inj,Quad PF,6+ Mos 10/09/2014  . Pneumococcal Conjugate-13 10/09/2013  . Pneumococcal Polysaccharide-23 11/08/2005  . Td 09/22/2005  . Tdap 10/18/2015  . Zoster 09/22/2005    Qualifies for Shingles Vaccine? Yes  Zostavax completed 09/22/2005. Due for Shingrix. Education has been provided regarding the importance of this vaccine. Pt has been advised to call insurance company to determine out of pocket expense. Advised may also receive vaccine at local pharmacy or Health Dept. Verbalized acceptance and understanding.  Tdap: up to date   Flu Vaccine: up to date  Pneumococcal Vaccine:up to date   Screening Tests Health Maintenance  Topic Date Due  . FOOT EXAM  04/20/2018  .  HEMOGLOBIN A1C  04/20/2018  . DEXA SCAN  06/16/2019 (Originally 06/21/1998)  . INFLUENZA VACCINE  08/13/2018  . URINE MICROALBUMIN  10/20/2018  . OPHTHALMOLOGY EXAM  12/14/2018  . TETANUS/TDAP  10/17/2025  . PNA vac Low Risk Adult  Completed    Cancer Screenings:  Colorectal Screening: no longer required   Mammogram: no longer   Bone Density: not indicated   Lung Cancer Screening: (Low Dose CT Chest recommended if Age 70-80 years, 30 pack-year currently smoking OR have quit w/in 15years.) does not qualify.   Additional Screening:  Hepatitis C Screening: does not qualify  Vision Screening: Recommended annual ophthalmology exams for early detection of glaucoma and other disorders of the eye. Is the patient up to date with their annual eye exam?  Yes  Who is the provider or what is the name of the office in which the pt attends annual eye exams? [facility]   Dental Screening: Recommended annual dental exams for proper oral hygiene  Community Resource Referral:  CRR required this visit?  No       Plan:  I have personally reviewed and addressed the Medicare Annual Wellness questionnaire and have noted the following in the patient's chart:  A. Medical and social history B. Use of alcohol, tobacco or illicit drugs  C. Current medications and supplements D. Functional ability and status E.  Nutritional status F.  Physical activity G. Advance directives H. List of other physicians I.  Hospitalizations, surgeries, and ER visits in previous 12 months J.  Vitals K. Screenings such as hearing and vision if needed, cognitive and depression L. Referrals and appointments   In addition, I have reviewed and discussed with patient certain preventive protocols, quality metrics, and best practice recommendations. A written  personalized care plan for preventive services as well as general preventive health recommendations were provided to patient. Nurse Health Advisor  Signed,    [place],  Truddie Hiddeniffany A, [place]LPN  1/6/10966/04/2018 Nurse Health Advisor   Nurse Notes: none

## 2018-06-16 NOTE — Patient Instructions (Signed)
Carbohydrate Counting for Diabetes Mellitus, Adult  Carbohydrate counting is a method of keeping track of how many carbohydrates you eat. Eating carbohydrates naturally increases the amount of sugar (glucose) in the blood. Counting how many carbohydrates you eat helps keep your blood glucose within normal limits, which helps you manage your diabetes (diabetes mellitus). It is important to know how many carbohydrates you can safely have in each meal. This is different for every person. A diet and nutrition specialist (registered dietitian) can help you make a meal plan and calculate how many carbohydrates you should have at each meal and snack. Carbohydrates are found in the following foods:  Grains, such as breads and cereals.  Dried beans and soy products.  Starchy vegetables, such as potatoes, peas, and corn.  Fruit and fruit juices.  Milk and yogurt.  Sweets and snack foods, such as cake, cookies, candy, chips, and soft drinks. How do I count carbohydrates? There are two ways to count carbohydrates in food. You can use either of the methods or a combination of both. Reading "Nutrition Facts" on packaged food The "Nutrition Facts" list is included on the labels of almost all packaged foods and beverages in the [place] It includes:  The serving size.  Information about nutrients in each serving, including the grams (g) of carbohydrate per serving. To use the "Nutrition Facts":  Decide how many servings you will have.  Multiply the number of servings by the number of carbohydrates per serving.  The resulting number is the total amount of carbohydrates that you will be having. Learning standard serving sizes of other foods When you eat carbohydrate foods that are not packaged or do not include "Nutrition Facts" on the label, you need to measure the servings in order to count the amount of carbohydrates:  Measure the foods that you will eat with a food scale or measuring cup, if needed.   Decide how many standard-size servings you will eat.  Multiply the number of servings by 15. Most carbohydrate-rich foods have about 15 g of carbohydrates per serving. ? For example, if you eat 8 oz (170 g) of strawberries, you will have eaten 2 servings and 30 g of carbohydrates (2 servings x 15 g = 30 g).  For foods that have more than one food mixed, such as soups and casseroles, you must count the carbohydrates in each food that is included. The following list contains standard serving sizes of common carbohydrate-rich foods. Each of these servings has about 15 g of carbohydrates:   hamburger bun or  English muffin.   oz (15 mL) syrup.   oz (14 g) jelly.  1 slice of bread.  1 six-inch tortilla.  3 oz (85 g) cooked rice or pasta.  4 oz (113 g) cooked dried beans.  4 oz (113 g) starchy vegetable, such as peas, corn, or potatoes.  4 oz (113 g) hot cereal.  4 oz (113 g) mashed potatoes or  of a large baked potato.  4 oz (113 g) canned or frozen fruit.  4 oz (120 mL) fruit juice.  4-6 crackers.  6 chicken nuggets.  6 oz (170 g) unsweetened dry cereal.  6 oz (170 g) plain fat-free yogurt or yogurt sweetened with artificial sweeteners.  8 oz (240 mL) milk.  8 oz (170 g) fresh fruit or one small piece of fruit.  24 oz (680 g) popped popcorn. Example of carbohydrate counting Sample meal  3 oz (85 g) chicken breast.  6 oz (170 g)   brown rice.  4 oz (113 g) corn.  8 oz (240 mL) milk.  8 oz (170 g) strawberries with sugar-free whipped topping. Carbohydrate calculation 1. Identify the foods that contain carbohydrates: ? Rice. ? Corn. ? Milk. ? Strawberries. 2. Calculate how many servings you have of each food: ? 2 servings rice. ? 1 serving corn. ? 1 serving milk. ? 1 serving strawberries. 3. Multiply each number of servings by 15 g: ? 2 servings rice x 15 g = 30 g. ? 1 serving corn x 15 g = 15 g. ? 1 serving milk x 15 g = 15 g. ? 1 serving  strawberries x 15 g = 15 g. 4. Add together all of the amounts to find the total grams of carbohydrates eaten: ? 30 g + 15 g + 15 g + 15 g = 75 g of carbohydrates total. Summary  Carbohydrate counting is a method of keeping track of how many carbohydrates you eat.  Eating carbohydrates naturally increases the amount of sugar (glucose) in the blood.  Counting how many carbohydrates you eat helps keep your blood glucose within normal limits, which helps you manage your diabetes.  A diet and nutrition specialist (registered dietitian) can help you make a meal plan and calculate how many carbohydrates you should have at each meal and snack. This information is not intended to replace advice given to you by your health care provider. Make sure you discuss any questions you have with your health care provider. Document Released: 12/29/2004 Document Revised: 07/08/2016 Document Reviewed: 06/12/2015 Elsevier Interactive Patient Education  2019 Elsevier Inc.  

## 2018-06-16 NOTE — Assessment & Plan Note (Signed)
Chronic, ongoing.  Continue current medication regimen and adjust as needed, will obtain outpatient labs.

## 2018-06-27 ENCOUNTER — Other Ambulatory Visit: Payer: Medicare Other

## 2018-06-27 ENCOUNTER — Other Ambulatory Visit: Payer: Self-pay

## 2018-06-27 DIAGNOSIS — M8580 Other specified disorders of bone density and structure, unspecified site: Secondary | ICD-10-CM

## 2018-06-27 DIAGNOSIS — E119 Type 2 diabetes mellitus without complications: Secondary | ICD-10-CM

## 2018-06-27 DIAGNOSIS — E038 Other specified hypothyroidism: Secondary | ICD-10-CM

## 2018-06-27 DIAGNOSIS — E7800 Pure hypercholesterolemia, unspecified: Secondary | ICD-10-CM

## 2018-06-27 LAB — LIPID PANEL PICCOLO, WAIVED
Chol/HDL Ratio Piccolo,Waive: 3.1 mg/dL
Cholesterol Piccolo, Waived: 121 mg/dL (ref <200)
HDL Chol Piccolo, Waived: 40 mg/dL — ABNORMAL LOW (ref >59)
LDL Chol Calc Piccolo Waived: 66 mg/dL (ref <100)
Triglycerides Piccolo,Waived: 80 mg/dL (ref <150)
VLDL Chol Calc Piccolo,Waive: 16 mg/dL (ref <30)

## 2018-06-27 LAB — MICROALBUMIN, URINE WAIVED
Creatinine, Urine Waived: 10 mg/dL (ref 10–300)
Microalb, Ur Waived: 10 mg/L (ref 0–19)
Microalb/Creat Ratio: <30 mg/g (ref <30)

## 2018-06-27 LAB — BAYER DCA HB A1C WAIVED: HB A1C (BAYER DCA - WAIVED): 6.5 %

## 2018-06-28 LAB — COMPREHENSIVE METABOLIC PANEL
ALT: 18 IU/L (ref 0–32)
AST: 30 IU/L (ref 0–40)
Albumin/Globulin Ratio: 2 (ref 1.2–2.2)
Albumin: 4.5 g/dL (ref 3.6–4.6)
Alkaline Phosphatase: 89 IU/L (ref 39–117)
BUN/Creatinine Ratio: 21 (ref 12–28)
BUN: 17 mg/dL (ref 8–27)
Bilirubin Total: 0.9 mg/dL (ref 0.0–1.2)
CO2: 21 mmol/L (ref 20–29)
Calcium: 9.6 mg/dL (ref 8.7–10.3)
Chloride: 104 mmol/L (ref 96–106)
Creatinine, Ser: 0.81 mg/dL (ref 0.57–1.00)
GFR calc Af Amer: 77 mL/min/{1.73_m2} (ref >59)
GFR calc non Af Amer: 66 mL/min/{1.73_m2} (ref >59)
Globulin, Total: 2.2 g/dL (ref 1.5–4.5)
Glucose: 129 mg/dL — ABNORMAL HIGH (ref 65–99)
Potassium: 4.3 mmol/L (ref 3.5–5.2)
Sodium: 141 mmol/L (ref 134–144)
Total Protein: 6.7 g/dL (ref 6.0–8.5)

## 2018-06-28 LAB — THYROID PANEL WITH TSH
Free Thyroxine Index: 3.2 (ref 1.2–4.9)
T3 Uptake Ratio: 30 % (ref 24–39)
T4, Total: 10.6 ug/dL (ref 4.5–12.0)
TSH: 0.547 u[IU]/mL (ref 0.450–4.500)

## 2018-06-28 LAB — VITAMIN D 25 HYDROXY (VIT D DEFICIENCY, FRACTURES): Vit D, 25-Hydroxy: 45.8 ng/mL (ref 30.0–100.0)

## 2018-09-19 ENCOUNTER — Ambulatory Visit: Payer: Self-pay | Admitting: Nurse Practitioner

## 2018-09-20 ENCOUNTER — Ambulatory Visit: Payer: Self-pay | Admitting: Nurse Practitioner

## 2018-10-03 ENCOUNTER — Ambulatory Visit: Payer: Self-pay | Admitting: Nurse Practitioner

## 2018-10-09 ENCOUNTER — Encounter: Payer: Self-pay | Admitting: Nurse Practitioner

## 2018-10-10 ENCOUNTER — Encounter: Payer: Self-pay | Admitting: Nurse Practitioner

## 2018-10-10 ENCOUNTER — Ambulatory Visit: Payer: Medicare Other | Admitting: Nurse Practitioner

## 2018-10-10 ENCOUNTER — Other Ambulatory Visit: Payer: Self-pay

## 2018-10-10 VITALS — BP 121/70 | HR 61 | Temp 98.1°F

## 2018-10-10 DIAGNOSIS — Z23 Encounter for immunization: Secondary | ICD-10-CM

## 2018-10-10 DIAGNOSIS — E7800 Pure hypercholesterolemia, unspecified: Secondary | ICD-10-CM | POA: Diagnosis not present

## 2018-10-10 DIAGNOSIS — E038 Other specified hypothyroidism: Secondary | ICD-10-CM

## 2018-10-10 DIAGNOSIS — E119 Type 2 diabetes mellitus without complications: Secondary | ICD-10-CM | POA: Diagnosis not present

## 2018-10-10 LAB — BAYER DCA HB A1C WAIVED: HB A1C (BAYER DCA - WAIVED): 6.7 % (ref <7.0)

## 2018-10-10 NOTE — Assessment & Plan Note (Signed)
Chronic, ongoing.  Continue current medication regimen and adjust as needed.  TSH today to ensure not trending lower. 

## 2018-10-10 NOTE — Patient Instructions (Signed)

## 2018-10-10 NOTE — Assessment & Plan Note (Signed)
Chronic, ongoing.  Continue current medication regimen and adjust as needed.   

## 2018-10-10 NOTE — Assessment & Plan Note (Signed)
Chronic, stable, diet-controlled.  A1C today 6.7%.  Recommend continued focus on diet.  Due to advanced age more lenient goal recommend, A1C <8.  Avoid hypoglycemia.  Due to current Covid pandemic patient wishes to push next visit out 6 months, this is appropriate.

## 2018-10-10 NOTE — Progress Notes (Signed)
BP 121/70   Pulse 61   Temp 98.1 F (36.7 C) (Oral)   LMP  (LMP Unknown)   SpO2 96%    Subjective:    Patient ID: Kirsten Riley, female    DOB: 13-Aug-1931, 85 y.o.   MRN: [id number]  HPI: Kirsten Riley is a 85 y.o. female  Chief Complaint  Patient presents with  . Diabetes   DIABETES No current medications, continues on diet control with recent A1C in June 6.5%.  She was recently taken off Methotrexate by rheumatology. Hypoglycemic episodes:no Polydipsia/polyuria: no Visual disturbance: no Chest pain: no Paresthesias: no Glucose Monitoring: no  Accucheck frequency: Not Checking  Fasting glucose:  Post prandial:  Evening:  Before meals: Taking Insulin?: no  Long acting insulin:  Short acting insulin: Blood Pressure Monitoring: not checking Retinal Examination: Up to Date Foot Exam: Up to Date Pneumovax: Up to date Influenza: Up to Date Aspirin: no   HYPOTHYROIDISM Continues on Levothyroxine 75 MCG daily.  Three months ago TSH 0.547. Thyroid control status:stable Satisfied with current treatment? yes Medication side effects: no Medication compliance: good compliance Etiology of hypothyroidism:  Recent dose adjustment:no Fatigue: no Cold intolerance: no Heat intolerance: no Weight gain: no Weight loss: no Constipation: no Diarrhea/loose stools: no Palpitations: no Lower extremity edema: no Anxiety/depressed mood: no   HYPERLIPIDEMIA Continues on Lovastatin 40 MG daily. Hyperlipidemia status: good compliance Satisfied with current treatment?  no Side effects:  no Medication compliance: good compliance Supplements: none Aspirin:  no The ASCVD Risk score Denman George DC Jr., et al., 2013) failed to calculate for the following reasons:   The 2013 ASCVD risk score is only valid for ages 52 to 6 Chest pain:  no Coronary artery disease:  no Family history CAD:  no Family history early CAD:  no  Relevant past medical, surgical, family and social history reviewed  and updated as indicated. Interim medical history since our last visit reviewed. Allergies and medications reviewed and updated.  Review of Systems  Constitutional: Negative for activity change, appetite change, diaphoresis, fatigue and fever.  Respiratory: Negative for cough, chest tightness and shortness of breath.   Cardiovascular: Negative for chest pain, palpitations and leg swelling.  Gastrointestinal: Negative for abdominal distention, abdominal pain, constipation, diarrhea, nausea and vomiting.  Endocrine: Negative for cold intolerance, heat intolerance, polydipsia, polyphagia and polyuria.  Neurological: Negative for dizziness, syncope, weakness, light-headedness, numbness and headaches.  Psychiatric/Behavioral: Negative.     Per HPI unless specifically indicated above     Objective:    BP 121/70   Pulse 61   Temp 98.1 F (36.7 C) (Oral)   LMP  (LMP Unknown)   SpO2 96%   Wt Readings from Last 3 Encounters:  06/16/18 118 lb (53.5 kg)  06/16/18 118 lb (53.5 kg)  12/06/17 123 lb (55.8 kg)    Physical Exam Vitals signs and nursing note reviewed.  Constitutional:      General: She is awake. She is not in acute distress.    Appearance: She is well-developed. She is not ill-appearing.  HENT:     Head: Normocephalic.     Right Ear: Hearing normal.     Left Ear: Hearing normal.  Eyes:     General: Lids are normal.        Right eye: No discharge.        Left eye: No discharge.     Conjunctiva/sclera: Conjunctivae normal.     Pupils: Pupils are equal, round, and reactive to light.  Neck:     Musculoskeletal: Normal range of motion and neck supple.     Thyroid: No thyromegaly.     Vascular: No carotid bruit.  Cardiovascular:     Rate and Rhythm: Normal rate and regular rhythm.     Heart sounds: Normal heart sounds. No murmur. No gallop.   Pulmonary:     Effort: Pulmonary effort is normal. No accessory muscle usage or respiratory distress.     Breath sounds: Normal  breath sounds.  Abdominal:     General: Bowel sounds are normal.     Palpations: Abdomen is soft.  Musculoskeletal:     Right lower leg: No edema.     Left lower leg: No edema.  Skin:    General: Skin is warm and dry.  Neurological:     Mental Status: She is alert and oriented to person, place, and time.  Psychiatric:        Attention and Perception: Attention normal.        Mood and Affect: Mood normal.        Behavior: Behavior normal. Behavior is cooperative.        Thought Content: Thought content normal.        Judgment: Judgment normal.     Results for orders placed or performed in visit on 06/27/18  VITAMIN D 25 Hydroxy (Vit-D Deficiency, Fractures)  Result Value Ref Range   Vit D, 25-Hydroxy 45.8 30.0 - 100.0 ng/mL  Thyroid Panel With TSH  Result Value Ref Range   TSH 0.547 0.450 - 4.500 uIU/mL   T4, Total 10.6 4.5 - 12.0 ug/dL   T3 Uptake Ratio 30 24 - 39 %   Free Thyroxine Index 3.2 1.2 - 4.9  Microalbumin, Urine Waived  Result Value Ref Range   Microalb, Ur Waived 10 0 - 19 mg/L   Creatinine, Urine Waived 10 10 - 300 mg/dL   Microalb/Creat Ratio <30 <30 mg/g  Comprehensive metabolic panel  Result Value Ref Range   Glucose 129 (H) 65 - 99 mg/dL   BUN 17 8 - 27 mg/dL   Creatinine, Ser 9.140.81 0.57 - 1.00 mg/dL   GFR calc non Af Amer 66 >59 mL/min/1.73   GFR calc Af Amer 77 >59 mL/min/1.73   BUN/Creatinine Ratio 21 12 - 28   Sodium 141 134 - 144 mmol/L   Potassium 4.3 3.5 - 5.2 mmol/L   Chloride 104 96 - 106 mmol/L   CO2 21 20 - 29 mmol/L   Calcium 9.6 8.7 - 10.3 mg/dL   Total Protein 6.7 6.0 - 8.5 g/dL   Albumin 4.5 3.6 - 4.6 g/dL   Globulin, Total 2.2 1.5 - 4.5 g/dL   Albumin/Globulin Ratio 2.0 1.2 - 2.2   Bilirubin Total 0.9 0.0 - 1.2 mg/dL   Alkaline Phosphatase 89 39 - 117 IU/L   AST 30 0 - 40 IU/L   ALT 18 0 - 32 IU/L  Lipid Panel Piccolo, Waived  Result Value Ref Range   Cholesterol Piccolo, Waived 121 <200 mg/dL   HDL Chol Piccolo, Waived 40 (L)  >59 mg/dL   Triglycerides Piccolo,Waived 80 <150 mg/dL   Chol/HDL Ratio Piccolo,Waive 3.1 mg/dL   LDL Chol Calc Piccolo Waived 66 <100 mg/dL   VLDL Chol Calc Piccolo,Waive 16 <30 mg/dL  Bayer DCA Hb N8GA1c Waived  Result Value Ref Range   HB A1C (BAYER DCA - WAIVED) 6.5 <7.0 %      Assessment & Plan:   Problem List Items Addressed  This Visit      Endocrine   Hypothyroidism    Chronic, ongoing.  Continue current medication regimen and adjust as needed.  TSH today to ensure not trending lower.      Relevant Orders   Thyroid Panel With TSH   Diabetes mellitus without complication (HCC) - Primary    Chronic, stable, diet-controlled.  A1C today 6.7%.  Recommend continued focus on diet.  Due to advanced age more lenient goal recommend, A1C <8.  Avoid hypoglycemia.  Due to current Covid pandemic patient wishes to push next visit out 6 months, this is appropriate.      Relevant Orders   Bayer DCA Hb A1c Waived     Other   Hyperlipidemia    Chronic, ongoing.  Continue current medication regimen and adjust as needed.        Other Visit Diagnoses    Need for influenza vaccination       Relevant Orders   Flu Vaccine QUAD High Dose(Fluad) (Completed)       Follow up plan: Return in about 6 months (around 04/09/2019) for T2DM, Hypothyroid, HLD.

## 2018-10-11 LAB — THYROID PANEL WITH TSH
Free Thyroxine Index: 2.8 (ref 1.2–4.9)
T3 Uptake Ratio: 31 % (ref 24–39)
T4, Total: 8.9 ug/dL (ref 4.5–12.0)
TSH: 0.666 u[IU]/mL (ref 0.450–4.500)

## 2018-10-24 ENCOUNTER — Other Ambulatory Visit: Payer: Self-pay | Admitting: Nurse Practitioner

## 2019-01-21 ENCOUNTER — Other Ambulatory Visit: Payer: Self-pay | Admitting: Nurse Practitioner

## 2019-03-05 LAB — HM DEXA SCAN

## 2019-04-08 ENCOUNTER — Encounter: Payer: Self-pay | Admitting: Nurse Practitioner

## 2019-04-08 DIAGNOSIS — M353 Polymyalgia rheumatica: Secondary | ICD-10-CM | POA: Insufficient documentation

## 2019-04-11 ENCOUNTER — Other Ambulatory Visit: Payer: Self-pay

## 2019-04-11 ENCOUNTER — Encounter: Payer: Self-pay | Admitting: Nurse Practitioner

## 2019-04-11 ENCOUNTER — Ambulatory Visit (INDEPENDENT_AMBULATORY_CARE_PROVIDER_SITE_OTHER): Payer: Medicare PPO | Admitting: Nurse Practitioner

## 2019-04-11 VITALS — BP 123/70 | HR 60 | Temp 98.3°F | Wt 123.0 lb

## 2019-04-11 DIAGNOSIS — M0609 Rheumatoid arthritis without rheumatoid factor, multiple sites: Secondary | ICD-10-CM

## 2019-04-11 DIAGNOSIS — E119 Type 2 diabetes mellitus without complications: Secondary | ICD-10-CM | POA: Diagnosis not present

## 2019-04-11 DIAGNOSIS — E038 Other specified hypothyroidism: Secondary | ICD-10-CM

## 2019-04-11 DIAGNOSIS — E1169 Type 2 diabetes mellitus with other specified complication: Secondary | ICD-10-CM | POA: Diagnosis not present

## 2019-04-11 DIAGNOSIS — E785 Hyperlipidemia, unspecified: Secondary | ICD-10-CM

## 2019-04-11 DIAGNOSIS — M353 Polymyalgia rheumatica: Secondary | ICD-10-CM

## 2019-04-11 DIAGNOSIS — M810 Age-related osteoporosis without current pathological fracture: Secondary | ICD-10-CM

## 2019-04-11 LAB — MICROALBUMIN, URINE WAIVED
Creatinine, Urine Waived: 50 mg/dL (ref 10–300)
Microalb, Ur Waived: 10 mg/L (ref 0–19)
Microalb/Creat Ratio: <30 mg/g (ref <30)

## 2019-04-11 LAB — BAYER DCA HB A1C WAIVED: HB A1C (BAYER DCA - WAIVED): 7 % — ABNORMAL HIGH (ref <7.0)

## 2019-04-11 LAB — HM DIABETES EYE EXAM

## 2019-04-11 MED ORDER — ALENDRONATE SODIUM 70 MG PO TABS: 70.0000 mg | ORAL_TABLET | ORAL | 3 refills | Status: DC

## 2019-04-11 NOTE — Progress Notes (Signed)
BP 123/70   Pulse 60   Temp 98.3 F (36.8 C) (Oral)   Wt 123 lb (55.8 kg)   LMP  (LMP Unknown)   SpO2 96%   BMI 24.02 kg/m    Subjective:    Patient ID: Kirsten Riley, female    DOB: [birth year], 86 y.o.   MRN: [id number]  HPI: Kirsten Riley is a 86 y.o. female  Chief Complaint  Patient presents with  . Diabetes  . Hypothyroidism  . Hyperlipidemia   DIABETES No current medications, continues on diet control with recent A1C in September 6.7%.   Hypoglycemic episodes:no Polydipsia/polyuria: no Visual disturbance: no Chest pain: no Paresthesias: no Glucose Monitoring: no  Accucheck frequency: Not Checking  Fasting glucose:  Post prandial:  Evening:  Before meals: Taking Insulin?: no  Long acting insulin:  Short acting insulin: Blood Pressure Monitoring: not checking Retinal Examination: Up to Date Foot Exam: Up to Date Pneumovax: Up to date Influenza: Up to Date Aspirin: no   HYPOTHYROIDISM Continues on Levothyroxine 75 MCG daily.  Last  TSH 0.666. Thyroid control status:stable Satisfied with current treatment? yes Medication side effects: no Medication compliance: good compliance Etiology of hypothyroidism:  Recent dose adjustment:no Fatigue: no Cold intolerance: no Heat intolerance: no Weight gain: no Weight loss: no Constipation: no Diarrhea/loose stools: no Palpitations: no Lower extremity edema: no Anxiety/depressed mood: no   HYPERLIPIDEMIA Continues on Lovastatin 40 MG daily. Hyperlipidemia status: good compliance Satisfied with current treatment?  no Side effects:  no Medication compliance: good compliance Supplements: none Aspirin:  no The ASCVD Risk score Denman George DC Jr., et al., 2013) failed to calculate for the following reasons:   The 2013 ASCVD risk score is only valid for ages 9 to 40 Chest pain:  no Coronary artery disease:  no Family history CAD:  no Family history early CAD:  no   OSTEOPOROSIS: Last DEXA 03/06/2019 with T score  -3.1.  She is followed by Dr. Gavin Potters at [facility] for RA and osteoporosis, last visit in February 2021.  Continues on Fosamax weekly and Vitamin D.  She reported Dr. Gavin Potters told her she does not need to come back unless needed, this is noted on review of note as well.  On review of note can determine need for continuation of Fosamax vs holiday, discussed with patient and she wishes to continue at this time -- has had 3 or more years of this.  She was taken off Methotrexate in September.  Relevant past medical, surgical, family and social history reviewed and updated as indicated. Interim medical history since our last visit reviewed. Allergies and medications reviewed and updated.  Review of Systems  Constitutional: Negative for activity change, appetite change, diaphoresis, fatigue and fever.  Respiratory: Negative for cough, chest tightness and shortness of breath.   Cardiovascular: Negative for chest pain, palpitations and leg swelling.  Gastrointestinal: Negative for abdominal distention, abdominal pain, constipation, diarrhea, nausea and vomiting.  Endocrine: Negative for cold intolerance, heat intolerance, polydipsia, polyphagia and polyuria.  Neurological: Negative for dizziness, syncope, weakness, light-headedness, numbness and headaches.  Psychiatric/Behavioral: Negative.     Per HPI unless specifically indicated above     Objective:    BP 123/70   Pulse 60   Temp 98.3 F (36.8 C) (Oral)   Wt 123 lb (55.8 kg)   LMP  (LMP Unknown)   SpO2 96%   BMI 24.02 kg/m   Wt Readings from Last 3 Encounters:  04/11/19 123 lb (55.8 kg)  06/16/18  118 lb (53.5 kg)  06/16/18 118 lb (53.5 kg)    Physical Exam Vitals signs and nursing note reviewed.  Constitutional:      General: She is awake. She is not in acute distress.    Appearance: She is well-developed. She is not ill-appearing.  HENT:     Head: Normocephalic.     Right Ear: Hearing normal.     Left Ear: Hearing normal.  Eyes:      General: Lids are normal.        Right eye: No discharge.        Left eye: No discharge.     Conjunctiva/sclera: Conjunctivae normal.     Pupils: Pupils are equal, round, and reactive to light.  Neck:     Musculoskeletal: Normal range of motion and neck supple.     Thyroid: No thyromegaly.     Vascular: No carotid bruit.  Cardiovascular:     Rate and Rhythm: Normal rate and regular rhythm.     Heart sounds: Normal heart sounds. No murmur. No gallop.   Pulmonary:     Effort: Pulmonary effort is normal. No accessory muscle usage or respiratory distress.     Breath sounds: Normal breath sounds.  Abdominal:     General: Bowel sounds are normal.     Palpations: Abdomen is soft.  Musculoskeletal:     Right lower leg: No edema.     Left lower leg: No edema.  Skin:    General: Skin is warm and dry.  Neurological:     Mental Status: She is alert and oriented to person, place, and time.  Psychiatric:        Attention and Perception: Attention normal.        Mood and Affect: Mood normal.        Behavior: Behavior normal. Behavior is cooperative.        Thought Content: Thought content normal.        Judgment: Judgment normal.     Results for orders placed or performed in visit on 10/10/18  Bayer DCA Hb A1c Waived  Result Value Ref Range   HB A1C (BAYER DCA - WAIVED) 6.7 <7.0 %  Thyroid Panel With TSH  Result Value Ref Range   TSH 0.666 0.450 - 4.500 uIU/mL   T4, Total 8.9 4.5 - 12.0 ug/dL   T3 Uptake Ratio 31 24 - 39 %   Free Thyroxine Index 2.8 1.2 - 4.9      Assessment & Plan:   Problem List Items Addressed This Visit      Endocrine   Hyperlipidemia associated with type 2 diabetes mellitus (HCC)    Chronic, ongoing.  Continue current medication regimen and adjust as needed.  Can consider discontinuation of this if patient wishes to minimize medications in future.      Relevant Orders   Bayer DCA Hb A1c Waived   Lipid Panel w/o Chol/HDL Ratio   Hypothyroidism     Chronic, ongoing.  Continue current medication regimen and adjust as needed.  TSH today to ensure not trending lower.      Relevant Orders   Thyroid Panel With TSH   Diabetes mellitus without complication (HCC) - Primary    Chronic, stable, diet-controlled.  A1C today 7%. Urine micro 10 and A:C <30.   Recommend continued focus on diet.  Due to advanced age more lenient goal recommended, A1C <8.  Avoid hypoglycemia.  Return in 6 months.      Relevant Orders  Bayer DCA Hb A1c Waived   Microalbumin, Urine Waived   Basic metabolic panel     Musculoskeletal and Integument   Rheumatoid arthritis of multiple sites without rheumatoid factor (HCC)    Chronic, stable.  No further medications.  Is to return to rheumatology as needed.  At this time is asymptomatic.      Osteoporosis    Chronic, stable with Fosamax.  Previously monitored by rheumatology, no longer needed.  Is to follow-up with PCP.  Wishes to continued Fosamax at this time, refills sent.  Consider holiday in future as has been on medication for 3 years or more.      Relevant Medications   alendronate (FOSAMAX) 70 MG tablet     Other   PMR (polymyalgia rheumatica) (HCC)    Previously monitored by rheumatology with last visit in February, symptoms have improved and she is to return as needed only.            Follow up plan: Return in about 6 months (around 10/12/2019) for T2DM, HLD, Hypothyroid, Osteoporosis.

## 2019-04-11 NOTE — Assessment & Plan Note (Signed)
Chronic, ongoing.  Continue current medication regimen and adjust as needed.  Can consider discontinuation of this if patient wishes to minimize medications in future. 

## 2019-04-11 NOTE — Assessment & Plan Note (Signed)
Previously monitored by rheumatology with last visit in February, symptoms have improved and she is to return as needed only.

## 2019-04-11 NOTE — Patient Instructions (Signed)
Carbohydrate Counting for Diabetes Mellitus, Adult  Carbohydrate counting is a method of keeping track of how many carbohydrates you eat. Eating carbohydrates naturally increases the amount of sugar (glucose) in the blood. Counting how many carbohydrates you eat helps keep your blood glucose within normal limits, which helps you manage your diabetes (diabetes mellitus). It is important to know how many carbohydrates you can safely have in each meal. This is different for every person. A diet and nutrition specialist (registered dietitian) can help you make a meal plan and calculate how many carbohydrates you should have at each meal and snack. Carbohydrates are found in the following foods:  Grains, such as breads and cereals.  Dried beans and soy products.  Starchy vegetables, such as potatoes, peas, and corn.  Fruit and fruit juices.  Milk and yogurt.  Sweets and snack foods, such as cake, cookies, candy, chips, and soft drinks. How do I count carbohydrates? There are two ways to count carbohydrates in food. You can use either of the methods or a combination of both. Reading "Nutrition Facts" on packaged food The "Nutrition Facts" list is included on the labels of almost all packaged foods and beverages in the [place] It includes:  The serving size.  Information about nutrients in each serving, including the grams (g) of carbohydrate per serving. To use the "Nutrition Facts":  Decide how many servings you will have.  Multiply the number of servings by the number of carbohydrates per serving.  The resulting number is the total amount of carbohydrates that you will be having. Learning standard serving sizes of other foods When you eat carbohydrate foods that are not packaged or do not include "Nutrition Facts" on the label, you need to measure the servings in order to count the amount of carbohydrates:  Measure the foods that you will eat with a food scale or measuring cup, if  needed.  Decide how many standard-size servings you will eat.  Multiply the number of servings by 15. Most carbohydrate-rich foods have about 15 g of carbohydrates per serving. ? For example, if you eat 8 oz (170 g) of strawberries, you will have eaten 2 servings and 30 g of carbohydrates (2 servings x 15 g = 30 g).  For foods that have more than one food mixed, such as soups and casseroles, you must count the carbohydrates in each food that is included. The following list contains standard serving sizes of common carbohydrate-rich foods. Each of these servings has about 15 g of carbohydrates:   hamburger bun or  English muffin.   oz (15 mL) syrup.   oz (14 g) jelly.  1 slice of bread.  1 six-inch tortilla.  3 oz (85 g) cooked rice or pasta.  4 oz (113 g) cooked dried beans.  4 oz (113 g) starchy vegetable, such as peas, corn, or potatoes.  4 oz (113 g) hot cereal.  4 oz (113 g) mashed potatoes or  of a large baked potato.  4 oz (113 g) canned or frozen fruit.  4 oz (120 mL) fruit juice.  4-6 crackers.  6 chicken nuggets.  6 oz (170 g) unsweetened dry cereal.  6 oz (170 g) plain fat-free yogurt or yogurt sweetened with artificial sweeteners.  8 oz (240 mL) milk.  8 oz (170 g) fresh fruit or one small piece of fruit.  24 oz (680 g) popped popcorn. Example of carbohydrate counting Sample meal  3 oz (85 g) chicken breast.  6 oz (170 g)   brown rice.  4 oz (113 g) corn.  8 oz (240 mL) milk.  8 oz (170 g) strawberries with sugar-free whipped topping. Carbohydrate calculation 1. Identify the foods that contain carbohydrates: ? Rice. ? Corn. ? Milk. ? Strawberries. 2. Calculate how many servings you have of each food: ? 2 servings rice. ? 1 serving corn. ? 1 serving milk. ? 1 serving strawberries. 3. Multiply each number of servings by 15 g: ? 2 servings rice x 15 g = 30 g. ? 1 serving corn x 15 g = 15 g. ? 1 serving milk x 15 g = 15 g. ? 1  serving strawberries x 15 g = 15 g. 4. Add together all of the amounts to find the total grams of carbohydrates eaten: ? 30 g + 15 g + 15 g + 15 g = 75 g of carbohydrates total. Summary  Carbohydrate counting is a method of keeping track of how many carbohydrates you eat.  Eating carbohydrates naturally increases the amount of sugar (glucose) in the blood.  Counting how many carbohydrates you eat helps keep your blood glucose within normal limits, which helps you manage your diabetes.  A diet and nutrition specialist (registered dietitian) can help you make a meal plan and calculate how many carbohydrates you should have at each meal and snack. This information is not intended to replace advice given to you by your health care provider. Make sure you discuss any questions you have with your health care provider. Document Revised: 07/23/2016 Document Reviewed: 06/12/2015 Elsevier Patient Education  2020 [employer] Inc.  

## 2019-04-11 NOTE — Assessment & Plan Note (Addendum)
Chronic, stable, diet-controlled.  A1C today 7%. Urine micro 10 and A:C <30.   Recommend continued focus on diet.  Due to advanced age more lenient goal recommended, A1C <8.  Avoid hypoglycemia.  Return in 6 months.

## 2019-04-11 NOTE — Assessment & Plan Note (Signed)
Chronic, ongoing.  Continue current medication regimen and adjust as needed.  TSH today to ensure not trending lower.

## 2019-04-11 NOTE — Assessment & Plan Note (Signed)
Chronic, stable with Fosamax.  Previously monitored by rheumatology, no longer needed.  Is to follow-up with PCP.  Wishes to continued Fosamax at this time, refills sent.  Consider holiday in future as has been on medication for 3 years or more.

## 2019-04-11 NOTE — Assessment & Plan Note (Signed)
Chronic, stable.  No further medications.  Is to return to rheumatology as needed.  At this time is asymptomatic. 

## 2019-04-12 LAB — BASIC METABOLIC PANEL
BUN/Creatinine Ratio: 17 (ref 12–28)
BUN: 17 mg/dL (ref 8–27)
CO2: 17 mmol/L — ABNORMAL LOW (ref 20–29)
Calcium: 10.5 mg/dL — ABNORMAL HIGH (ref 8.7–10.3)
Chloride: 101 mmol/L (ref 96–106)
Creatinine, Ser: 1 mg/dL (ref 0.57–1.00)
GFR calc Af Amer: 59 mL/min/{1.73_m2} — ABNORMAL LOW (ref >59)
GFR calc non Af Amer: 51 mL/min/{1.73_m2} — ABNORMAL LOW (ref >59)
Glucose: 147 mg/dL — ABNORMAL HIGH (ref 65–99)
Potassium: 4.9 mmol/L (ref 3.5–5.2)
Sodium: 140 mmol/L (ref 134–144)

## 2019-04-12 LAB — THYROID PANEL WITH TSH
Free Thyroxine Index: 4 (ref 1.2–4.9)
T3 Uptake Ratio: 34 % (ref 24–39)
T4, Total: 11.7 ug/dL (ref 4.5–12.0)
TSH: 0.146 u[IU]/mL — ABNORMAL LOW (ref 0.450–4.500)

## 2019-04-12 LAB — LIPID PANEL W/O CHOL/HDL RATIO
Cholesterol, Total: 131 mg/dL (ref 100–199)
HDL: 41 mg/dL (ref >39)
LDL Chol Calc (NIH): 70 mg/dL (ref 0–99)
Triglycerides: 110 mg/dL (ref 0–149)
VLDL Cholesterol Cal: 20 mg/dL (ref 5–40)

## 2019-04-12 NOTE — Progress Notes (Signed)
Contacted via MyChart

## 2019-04-13 ENCOUNTER — Other Ambulatory Visit: Payer: Self-pay | Admitting: Nurse Practitioner

## 2019-04-13 DIAGNOSIS — E038 Other specified hypothyroidism: Secondary | ICD-10-CM

## 2019-04-13 NOTE — Progress Notes (Signed)
Sent MyChart message, can you check and make sure she receives, if she did not this is message: "Good evening Ms. Kirsten Riley.  Your labs have returned.  Kidney function is showing some mild kidney disease with GFR 51, however this may be due to lab draw being difficult too.  We will recheck this next visit along with calcium which is showing mildly elevated.  Cholesterol levels are at goal.  Thyroid level is too low again, meaning more hyperthyroid, I would like to lower your Levothyroxine dose to 50 MCG.  Please let me know you received this message and I will send script in.  Then we should recheck this lab in 6 weeks via outpatient lab visit only."

## 2019-05-05 ENCOUNTER — Encounter: Payer: Self-pay | Admitting: Nurse Practitioner

## 2019-05-05 ENCOUNTER — Telehealth: Payer: Self-pay | Admitting: Nurse Practitioner

## 2019-05-05 NOTE — Telephone Encounter (Signed)
Have created letter after talking to patient further and will place in my folder for her to pick up Monday

## 2019-05-05 NOTE — Telephone Encounter (Signed)
Kirsten Riley brought letter up. Called and let patient know that letter was ready to be picked up.

## 2019-05-05 NOTE — Telephone Encounter (Signed)
Copied from CRM [id number]. Topic: General - Other >> May 05, 2019  1:27 PM [name] wrote: Reason for CRM: Pt requests a letter to excuse her from jury duty in [place]. Pt stated she can not drive. Pt would like a call back

## 2019-05-05 NOTE — Telephone Encounter (Signed)
Routing to provider  

## 2019-05-29 ENCOUNTER — Other Ambulatory Visit: Payer: Medicare PPO

## 2019-05-29 ENCOUNTER — Other Ambulatory Visit: Payer: Self-pay

## 2019-05-29 DIAGNOSIS — E038 Other specified hypothyroidism: Secondary | ICD-10-CM

## 2019-05-30 LAB — THYROID PANEL WITH TSH
Free Thyroxine Index: 2.9 (ref 1.2–4.9)
T3 Uptake Ratio: 30 % (ref 24–39)
T4, Total: 9.6 ug/dL (ref 4.5–12.0)
TSH: 0.115 u[IU]/mL — ABNORMAL LOW (ref 0.450–4.500)

## 2019-05-30 NOTE — Progress Notes (Signed)
Contacted via MyChart, but please ensure she checks messages.  Thank you.  Good afternoon Kirsten Riley.  Your thyroid level, TSH, is actually lower than previous. Went from 0.146 to now 0.115, but T4 [place] remaining normal.  Have you had any vaccines recently or any other medication changes?   I am concerned that we are trending downwards.  I know you are worried about changing medication dose, but perhaps we can even decrease by 12.5 MG daily.  I do worry about pushing you to much towards hyperthyroid as this can affect heart.  Would you be okay with trying a change in dose?  Please let me know.  Thank you.

## 2019-05-31 ENCOUNTER — Ambulatory Visit (INDEPENDENT_AMBULATORY_CARE_PROVIDER_SITE_OTHER): Payer: Medicare PPO

## 2019-05-31 VITALS — Wt 123.0 lb

## 2019-05-31 DIAGNOSIS — Z0000 Encounter for general adult medical examination without abnormal findings: Secondary | ICD-10-CM | POA: Diagnosis not present

## 2019-05-31 NOTE — Progress Notes (Signed)
Subjective:   Kirsten Riley is a 86 y.o. female who presents for Medicare Annual (Subsequent) preventive examination.  Review of Systems:   Cardiac Risk Factors include: advanced age (>83men, >17 women);dyslipidemia;hypertension     Objective:     Vitals: Wt 123 lb (55.8 kg)   LMP  (LMP Unknown)   BMI 24.02 kg/m   Body mass index is 24.02 kg/m.  Advanced Directives 06/16/2018 04/15/2016 01/09/2015  Does Patient Have a Medical Advance Directive? Yes Yes No  Type of Advance Directive Living will;Healthcare Power of [employer] Power of [place];Living will -  Copy of Healthcare Power of Attorney in Chart? No - copy requested No - copy requested -    Tobacco Social History   Tobacco Use  Smoking Status Never Smoker  Smokeless Tobacco Never Used     Counseling given: Not Answered   Clinical Intake:  Pre-visit preparation completed: Yes        Nutritional Risks: None Diabetes: Yes CBG done?: No Did pt. bring in CBG monitor from home?: No  How often do you need to have someone help you when you read instructions, pamphlets, or other written materials from your doctor or pharmacy?: 1 - Never  Interpreter Needed?: No  Information entered by :: Anelise Staron,LPN  Past Medical History:  Diagnosis Date  . CAD (coronary artery disease)   . Diabetes mellitus (HCC) 12/10/2014  . Fractured rib   . Hyperlipidemia   . Hypertension   . Hypothyroidism   . Polymyalgia rheumatica (HCC) 06/08/2014   Past Surgical History:  Procedure Laterality Date  . ABDOMINAL HYSTERECTOMY  1978   partial  . CORONARY ARTERY BYPASS GRAFT    . hypercholesterol    . hypothyroid     Family History  Problem Relation Age of Onset  . Heart disease Mother   . Stroke Mother   . Hypertension Mother   . Cancer Father   . Cancer Sister        breast, ovarian, pancreatic  . Heart disease Sister        x2  . Cancer Brother   . Heart disease Brother   . Cancer Brother   . Heart  disease Brother   . Cancer Brother   . Heart disease Brother    Social History   Socioeconomic History  . Marital status: Widowed    Spouse name: Not on file  . Number of children: Not on file  . Years of education: Not on file  . Highest education level: 12th grade  Occupational History  . Not on file  Tobacco Use  . Smoking status: Never Smoker  . Smokeless tobacco: Never Used  Substance and Sexual Activity  . Alcohol use: No    Alcohol/week: 0.0 standard drinks  . Drug use: No  . Sexual activity: Never  Other Topics Concern  . Not on file  Social History Narrative  . Not on file   Social Determinants of Health   Financial Resource Strain:   . Difficulty of Paying Living Expenses:   Food Insecurity:   . Worried About Programme researcher, broadcasting/film/video in the Last Year:   . Barista in the Last Year:   Transportation Needs:   . Freight forwarder (Medical):   Marland Kitchen Lack of Transportation (Non-Medical):   Physical Activity:   . Days of Exercise per Week:   . Minutes of Exercise per Session:   Stress:   . Feeling of Stress :  Social Connections:   . Frequency of Communication with Friends and Family:   . Frequency of Social Gatherings with Friends and Family:   . Attends Religious Services:   . Active Member of Clubs or Organizations:   . Attends Archivist Meetings:   Marland Kitchen Marital Status:     Outpatient Encounter Medications as of 05/31/2019  Medication Sig  . alendronate (FOSAMAX) 70 MG tablet Take 1 tablet (70 mg total) by mouth once a week.  . calcium-vitamin D 250-100 MG-UNIT per tablet Take 1 tablet by mouth 2 (two) times daily.  . folic acid (FOLVITE) 1 MG tablet Take by mouth.  . levothyroxine (SYNTHROID) 75 MCG tablet TAKE 1 TABLET BY MOUTH ONCE A DAY  . lovastatin (MEVACOR) 40 MG tablet TAKE 1 TABLET BY MOUTH AT BEDTIME  . Multiple Vitamin (MULTIVITAMIN WITH MINERALS) TABS tablet Take 1 tablet by mouth daily.   No facility-administered encounter  medications on file as of 05/31/2019.    Activities of Daily Living In your present state of health, do you have any difficulty performing the following activities: 05/31/2019 06/16/2018  Hearing? N N  Comment no hearing aids -  Vision? N N  Comment reading glasses -  Difficulty concentrating or making decisions? N N  Walking or climbing stairs? N N  Dressing or bathing? N N  Doing errands, shopping? N N  Preparing Food and eating ? N N  Using the Toilet? N N  In the past six months, have you accidently leaked urine? N N  Do you have problems with loss of bowel control? N N  Managing your Medications? N N  Managing your Finances? N N  Housekeeping or managing your Housekeeping? N N  Some recent data might be hidden    Patient Care Team: Venita Lick, NP as PCP - General (Nurse Practitioner) Emmaline Kluver., MD (Rheumatology)    Assessment:   This is a routine wellness examination for Syra.  Exercise Activities and Dietary recommendations Current Exercise Habits: Home exercise routine, Intensity: Mild, Exercise limited by: None identified  Goals Addressed   None     Fall Risk: Fall Risk  05/31/2019 06/16/2018 04/15/2017 04/15/2016 10/18/2015  Falls in the past year? 0 0 No No No  Number falls in past yr: 0 0 - - -  Injury with Fall? 0 1 - - -  Comment - - - - -    FALL RISK PREVENTION PERTAINING TO THE HOME:  Any stairs in or around the home? Yes  If so, are there any without handrails? No   Home free of loose throw rugs in walkways, pet beds, electrical cords, etc? Yes  Adequate lighting in your home to reduce risk of falls? Yes   ASSISTIVE DEVICES UTILIZED TO PREVENT FALLS:  Life alert? No  Use of a cane, walker or w/c? No  Grab bars in the bathroom? Yes  Shower chair or bench in shower? Yes  Elevated toilet seat or a handicapped toilet? No   DME ORDERS:  DME order needed?  No   TIMED UP AND GO:  Unable to perform   Depression Screen PHQ 2/9  Scores 06/16/2018 04/15/2017 04/15/2016 10/18/2015  PHQ - 2 Score 0 0 0 0  PHQ- 9 Score - - 0 -     Cognitive Function     6CIT Screen 06/16/2018 04/15/2017  What Year? 0 points 0 points  What month? 0 points 0 points  What time? 0 points 0 points  Count back from 20 0 points 0 points  Months in reverse 0 points 0 points  Repeat phrase 0 points 0 points  Total Score 0 0    Immunization History  Administered Date(s) Administered  . Fluad Quad(high Dose 65+) 10/10/2018  . Influenza, High Dose Seasonal PF 10/18/2015, 10/16/2016, 10/19/2017  . Influenza,inj,Quad PF,6+ Mos 10/09/2014  . Pneumococcal Conjugate-13 10/09/2013  . Pneumococcal Polysaccharide-23 11/08/2005  . Td 09/22/2005  . Tdap 10/18/2015  . Zoster 09/22/2005    Qualifies for Shingles Vaccine? Yes  Zostavax completed 2007. Due for Shingrix. Education has been provided regarding the importance of this vaccine. Pt has been advised to call insurance company to determine out of pocket expense. Advised may also receive vaccine at local pharmacy or Health Dept. Verbalized acceptance and understanding.  Tdap: up to date   Flu Vaccine: up to date   Pneumococcal Vaccine: up to date   Covid-19 Vaccine:  Completed vaccines, will bring her card in at next visit   Screening Tests Health Maintenance  Topic Date Due  . COVID-19 Vaccine (1) Never done  . FOOT EXAM  04/20/2018  . DEXA SCAN  06/16/2019 (Originally 06/21/1998)  . INFLUENZA VACCINE  08/13/2019  . HEMOGLOBIN A1C  10/12/2019  . OPHTHALMOLOGY EXAM  03/15/2020  . URINE MICROALBUMIN  04/10/2020  . TETANUS/TDAP  10/17/2025  . PNA vac Low Risk Adult  Completed    Cancer Screenings:  Colorectal Screening: no longer required   Mammogram: no longer required   Bone Density: 02/2019, osteoporosis   Lung Cancer Screening: (Low Dose CT Chest recommended if Age 57-80 years, 30 pack-year currently smoking OR have quit w/in 15years.) does not qualify.     Additional  Screening:  Hepatitis C Screening: does not  qualify  Vision Screening: Recommended annual ophthalmology exams for early detection of glaucoma and other disorders of the eye. Is the patient up to date with their annual eye exam?  Yes  Who is the provider or what is the name of the office in which the pt attends annual eye exams? Dr.Brasington    Dental Screening: Recommended annual dental exams for proper oral hygiene  Community Resource Referral:  CRR required this visit?  No       Plan:  I have personally reviewed and addressed the Medicare Annual Wellness questionnaire and have noted the following in the patient's chart:  A. Medical and social history B. Use of alcohol, tobacco or illicit drugs  C. Current medications and supplements D. Functional ability and status E.  Nutritional status F.  Physical activity G. Advance directives H. List of other physicians I.  Hospitalizations, surgeries, and ER visits in previous 12 months J.  Vitals K. Screenings such as hearing and vision if needed, cognitive and depression L. Referrals and appointments   In addition, I have reviewed and discussed with patient certain preventive protocols, quality metrics, and best practice recommendations. A written personalized care plan for preventive services as well as general preventive health recommendations were provided to patient.  Signed,    Collene Schlichter, LPN  7/67/2094 Nurse Health Advisor   Nurse Notes: none

## 2019-05-31 NOTE — Patient Instructions (Signed)
Kirsten Riley , Thank you for taking time to come for your Medicare Wellness Visit. I appreciate your ongoing commitment to your health goals. Please review the following plan we discussed and let me know if I can assist you in the future.   Screening recommendations/referrals: Colonoscopy: no longer required  Mammogram: no longer required  Bone Density: no longer required  Recommended yearly ophthalmology/optometry visit for glaucoma screening and checkup Recommended yearly dental visit for hygiene and checkup  Vaccinations: Influenza vaccine: due 09/2019 Pneumococcal vaccine: up to date Tdap vaccine: up to date  Shingles vaccine: shingrix eligible    Covid-19: completed   Advanced directives: Please bring a copy of your health care power of attorney and living will to the office at your convenience.  Conditions/risks identified: none   Next appointment: Follow up in one year for your annual wellness visit.    Preventive Care 71 Years and Older, Female Preventive care refers to lifestyle choices and visits with your health care provider that can promote health and wellness. What does preventive care include?  A yearly physical exam. This is also called an annual well check.  Dental exams once or twice a year.  Routine eye exams. Ask your health care provider how often you should have your eyes checked.  Personal lifestyle choices, including:  Daily care of your teeth and gums.  Regular physical activity.  Eating a healthy diet.  Avoiding tobacco and drug use.  Limiting alcohol use.  Practicing safe sex.  Taking low-dose aspirin every day.  Taking vitamin and mineral supplements as recommended by your health care provider. What happens during an annual well check? The services and screenings done by your health care provider during your annual well check will depend on your age, overall health, lifestyle risk factors, and family history of disease. Counseling  Your  health care provider may ask you questions about your:  Alcohol use.  Tobacco use.  Drug use.  Emotional well-being.  Home and relationship well-being.  Sexual activity.  Eating habits.  History of falls.  Memory and ability to understand (cognition).  Work and work Astronomer.  Reproductive health. Screening  You may have the following tests or measurements:  Height, weight, and BMI.  Blood pressure.  Lipid and cholesterol levels. These may be checked every 5 years, or more frequently if you are over 46 years old.  Skin check.  Lung cancer screening. You may have this screening every year starting at age 51 if you have a 30-pack-year history of smoking and currently smoke or have quit within the past 15 years.  Fecal occult blood test (FOBT) of the stool. You may have this test every year starting at age 36.  Flexible sigmoidoscopy or colonoscopy. You may have a sigmoidoscopy every 5 years or a colonoscopy every 10 years starting at age 3.  Hepatitis C blood test.  Hepatitis B blood test.  Sexually transmitted disease (STD) testing.  Diabetes screening. This is done by checking your blood sugar (glucose) after you have not eaten for a while (fasting). You may have this done every 1-3 years.  Bone density scan. This is done to screen for osteoporosis. You may have this done starting at age 3.  Mammogram. This may be done every 1-2 years. Talk to your health care provider about how often you should have regular mammograms. Talk with your health care provider about your test results, treatment options, and if necessary, the need for more tests. Vaccines  Your health  care provider may recommend certain vaccines, such as:  Influenza vaccine. This is recommended every year.  Tetanus, diphtheria, and acellular pertussis (Tdap, Td) vaccine. You may need a Td booster every 10 years.  Zoster vaccine. You may need this after age 23.  Pneumococcal 13-valent  conjugate (PCV13) vaccine. One dose is recommended after age 79.  Pneumococcal polysaccharide (PPSV23) vaccine. One dose is recommended after age 70. Talk to your health care provider about which screenings and vaccines you need and how often you need them. This information is not intended to replace advice given to you by your health care provider. Make sure you discuss any questions you have with your health care provider. Document Released: 01/25/2015 Document Revised: 09/18/2015 Document Reviewed: 10/30/2014 Elsevier Interactive Patient Education  2017 [place] Prevention in the Home Falls can cause injuries. They can happen to people of all ages. There are many things you can do to make your home safe and to help prevent falls. What can I do on the outside of my home?  Regularly fix the edges of walkways and driveways and fix any cracks.  Remove anything that might make you trip as you walk through a door, such as a raised step or threshold.  Trim any bushes or trees on the path to your home.  Use bright outdoor lighting.  Clear any walking paths of anything that might make someone trip, such as rocks or tools.  Regularly check to see if handrails are loose or broken. Make sure that both sides of any steps have handrails.  Any raised decks and porches should have guardrails on the edges.  Have any leaves, snow, or ice cleared regularly.  Use sand or salt on walking paths during winter.  Clean up any spills in your garage right away. This includes oil or grease spills. What can I do in the bathroom?  Use night lights.  Install grab bars by the toilet and in the tub and shower. Do not use towel bars as grab bars.  Use non-skid mats or decals in the tub or shower.  If you need to sit down in the shower, use a plastic, non-slip stool.  Keep the floor dry. Clean up any water that spills on the floor as soon as it happens.  Remove soap buildup in the tub or  shower regularly.  Attach bath mats securely with double-sided non-slip rug tape.  Do not have throw rugs and other things on the floor that can make you trip. What can I do in the bedroom?  Use night lights.  Make sure that you have a light by your bed that is easy to reach.  Do not use any sheets or blankets that are too big for your bed. They should not hang down onto the floor.  Have a firm chair that has side arms. You can use this for support while you get dressed.  Do not have throw rugs and other things on the floor that can make you trip. What can I do in the kitchen?  Clean up any spills right away.  Avoid walking on wet floors.  Keep items that you use a lot in easy-to-reach places.  If you need to reach something above you, use a strong step stool that has a grab bar.  Keep electrical cords out of the way.  Do not use floor polish or wax that makes floors slippery. If you must use wax, use non-skid floor wax.  Do not have  throw rugs and other things on the floor that can make you trip. What can I do with my stairs?  Do not leave any items on the stairs.  Make sure that there are handrails on both sides of the stairs and use them. Fix handrails that are broken or loose. Make sure that handrails are as long as the stairways.  Check any carpeting to make sure that it is firmly attached to the stairs. Fix any carpet that is loose or worn.  Avoid having throw rugs at the top or bottom of the stairs. If you do have throw rugs, attach them to the floor with carpet tape.  Make sure that you have a light switch at the top of the stairs and the bottom of the stairs. If you do not have them, ask someone to add them for you. What else can I do to help prevent falls?  Wear shoes that:  Do not have high heels.  Have rubber bottoms.  Are comfortable and fit you well.  Are closed at the toe. Do not wear sandals.  If you use a stepladder:  Make sure that it is fully  opened. Do not climb a closed stepladder.  Make sure that both sides of the stepladder are locked into place.  Ask someone to hold it for you, if possible.  Clearly mark and make sure that you can see:  Any grab bars or handrails.  First and last steps.  Where the edge of each step is.  Use tools that help you move around (mobility aids) if they are needed. These include:  Canes.  Walkers.  Scooters.  Crutches.  Turn on the lights when you go into a dark area. Replace any light bulbs as soon as they burn out.  Set up your furniture so you have a clear path. Avoid moving your furniture around.  If any of your floors are uneven, fix them.  If there are any pets around you, be aware of where they are.  Review your medicines with your doctor. Some medicines can make you feel dizzy. This can increase your chance of falling. Ask your doctor what other things that you can do to help prevent falls. This information is not intended to replace advice given to you by your health care provider. Make sure you discuss any questions you have with your health care provider. Document Released: 10/25/2008 Document Revised: 06/06/2015 Document Reviewed: 02/02/2014 Elsevier Interactive Patient Education  2017 [employer].

## 2019-06-01 ENCOUNTER — Other Ambulatory Visit: Payer: Self-pay | Admitting: Nurse Practitioner

## 2019-06-01 ENCOUNTER — Telehealth: Payer: Self-pay | Admitting: Nurse Practitioner

## 2019-06-01 DIAGNOSIS — E038 Other specified hypothyroidism: Secondary | ICD-10-CM

## 2019-06-01 MED ORDER — LEVOTHYROXINE SODIUM 50 MCG PO TABS: 50.0000 ug | ORAL_TABLET | Freq: Every day | ORAL | 3 refills | Status: DC

## 2019-06-01 NOTE — Telephone Encounter (Signed)
Copied from CRM [id number]. Topic: General - Other >> Jun 01, 2019  8:17 AM Jaquita Rector A wrote: Reason for CRM: Patient called to speak to [facility] about some changes that she wanted to make to her medication. Per patient she thought about it and have changed her mind asking if Jolene or her nurse could give her a call today to discuss these changes. Please call Ph# [phone]

## 2019-06-01 NOTE — Telephone Encounter (Signed)
Lab appt

## 2019-06-01 NOTE — Telephone Encounter (Signed)
Pt aware levothyroxine (SYNTHROID) 50 MCG tablet Sent to pharmacy.  Pt has made a 6 week lab appt.  Pt states she will call back if she does not do well on the 50 mcg every day. She states her son usually helps her and he is not in right now.

## 2019-06-01 NOTE — Telephone Encounter (Signed)
Sent in dose change and left general HIPAA compliant message with patient to alert her to this being sent.  Will reach out to her via MyChart for further.  Please schedule her for 6 week lab visit only.  Thank you.

## 2019-06-01 NOTE — Telephone Encounter (Signed)
Called and spoke with patient, she would like to go ahead with the thyroid medication dose change, but would only like a 30 day supply incase the medication does not work.  [employer].

## 2019-06-01 NOTE — Telephone Encounter (Signed)
Called pt to schedule 6 week lab appt, no answer, left vm to call back

## 2019-06-01 NOTE — Progress Notes (Signed)
Levothyroxine 50 MCG daily sent.

## 2019-06-01 NOTE — Telephone Encounter (Signed)
Called patient and left a voicemail asking patient to return my call.

## 2019-06-09 ENCOUNTER — Ambulatory Visit: Payer: Self-pay | Admitting: *Deleted

## 2019-06-09 NOTE — Telephone Encounter (Signed)
Can you please call her and tell her I advise and ER or UC visit today due to symptoms.  This could also be related to her recent lower TSH levels which can cause heart issues.  I would like her to be seen ASAP today prior to me increasing her back to 75 MCG.  I would like to ensure this is not cardiac related and it is important to get assessed immediately, especially since noted worse with exertion.

## 2019-06-09 NOTE — Telephone Encounter (Signed)
Called and LVM asking for patient to please return my call ASAP. Will try to call again before leaving for the day.

## 2019-06-09 NOTE — Telephone Encounter (Signed)
Spoke to patient and her son on telephone. Advised Kirsten Riley to immediately be seen in UC or ER setting, discussed with her that she could go to UC first if more comfortable doing this, but if they notice any abnormalities they may send her to ER setting.  She feels the CP is related to recent reduction in her Levothyroxine.  This was reduced due to TSH 0.115 on recent labs.  Discussed with her that although this could be causing issues, she would still benefit from UC or ER visit due to the symptoms she is having, which includes CP with exertion where she is having to sit down.  Spoke to her son, with her permission, and he reports he had been unaware she was having CP, she had not told him this.  He reports understands reason for her to be seen immediately and will take her to be seen for further work-up.

## 2019-06-09 NOTE — Telephone Encounter (Signed)
Pt called with complaints of chest tightness and weakness; she states her thyroid medication was changed by [facility]; on 06/07/19 the pt states she was working around the house when she experienced these symptoms; she had another episode today; in both instances she had to sit down; the pt says that the tightness is now constant, but is worse with exertion; she feels this is related to her change in dosage of thyroid med to 50 mcg on 06/01/19, and she would like it changed to 75 mcg; recommendations made per nurse triage protocol; the pt verbalized understanding but states she does not want to go to the ED because she feels like the change in her medication is causing these symptoms; again reviewed symptoms that require her to call EMS; she would like a callback at [phone]; unsuccessful attempt to contact [facility] at [facility]; will route to office for final disposition. Reason for Disposition . SEVERE chest pain  Answer Assessment - Initial Assessment Questions 1. LOCATION: "Where does it hurt?"       Behind both breasts 2. RADIATION: "Does the pain go anywhere else?" (e.g., into neck, jaw, arms, back)    Upper back 3. ONSET: "When did the chest pain begin?" (Minutes, hours or days)     06/07/19 4. PATTERN "Does the pain come and go, or has it been constant since it started?"  "Does it get worse with exertion?"      Suddenly; constant worse with exertion 5. DURATION: "How long does it last" (e.g., seconds, minutes, hours)     15-20 min 6. SEVERITY: "How bad is the pain?"  (e.g., Scale 1-10; mild, moderate, or severe)    - MILD (1-3): doesn't interfere with normal activities     - MODERATE (4-7): interferes with normal activities or awakens from sleep    - SEVERE (8-10): excruciating pain, unable to do any normal activities       severe 7. CARDIAC RISK FACTORS: "Do you have any history of heart problems or risk factors for heart disease?" (e.g., angina, prior heart attack; diabetes, high  blood pressure, high cholesterol, smoker, or strong family history of heart disease)     Polymyalgia rheumatica causes high blood sugar 8. PULMONARY RISK FACTORS: "Do you have any history of lung disease?"  (e.g., blood clots in lung, asthma, emphysema, birth control pills)    no 9. CAUSE: "What do you think is causing the chest pain?"     Change in thyroid medicne 06/01/19 10. OTHER SYMPTOMS: "Do you have any other symptoms?" (e.g., dizziness, nausea, vomiting, sweating, fever, difficulty breathing, cough)       Weakness, chest pressure 11. PREGNANCY: "Is there any chance you are pregnant?" "When was your last menstrual period?"     n/a  Protocols used: CHEST PAIN-A-AH

## 2019-06-13 ENCOUNTER — Telehealth: Payer: Self-pay | Admitting: Nurse Practitioner

## 2019-06-13 NOTE — Telephone Encounter (Signed)
Routing to provider, FYI.  

## 2019-06-13 NOTE — Telephone Encounter (Signed)
Noted and thank you.  Talked to her daughter via MyChart.

## 2019-06-13 NOTE — Telephone Encounter (Signed)
Copied from CRM [id number]. Topic: General - Inquiry >> Jun 13, 2019  2:28 PM Kirsten Riley wrote: Reason for CRM: Patient wanted to leave PCPa note regarding that she went to urgent care over Friday afternoon. Urgent care did not find anything. Patient is taking medication correctly and is feel better.  Patient wanted to updated PCP and nurse. Call back [phone]

## 2019-07-13 ENCOUNTER — Other Ambulatory Visit: Payer: Medicare PPO

## 2019-07-13 ENCOUNTER — Other Ambulatory Visit: Payer: Self-pay

## 2019-07-13 DIAGNOSIS — E038 Other specified hypothyroidism: Secondary | ICD-10-CM

## 2019-07-14 ENCOUNTER — Other Ambulatory Visit: Payer: Self-pay | Admitting: Nurse Practitioner

## 2019-07-14 DIAGNOSIS — E038 Other specified hypothyroidism: Secondary | ICD-10-CM

## 2019-07-14 LAB — THYROID PANEL WITH TSH
Free Thyroxine Index: 2.5 (ref 1.2–4.9)
T3 Uptake Ratio: 30 % (ref 24–39)
T4, Total: 8.3 ug/dL (ref 4.5–12.0)
TSH: 0.324 u[IU]/mL — ABNORMAL LOW (ref 0.450–4.500)

## 2019-07-14 NOTE — Progress Notes (Signed)
Contacted via MyChart Good morning Kirsten Riley, your labs have returned.  Your TSH is trending upwards at 0.324, I know you do not do well with changes to medication.  Lets keep your on the Levothyroxine 50 MCG daily and then have you come in 6 weeks for another lab check to ensure this is still going up and improving.  If you could call to schedule lab only visit or respond to this message to let staff know you need lab only visit. Any questions?

## 2019-08-14 ENCOUNTER — Other Ambulatory Visit: Payer: Self-pay | Admitting: Nurse Practitioner

## 2019-08-30 ENCOUNTER — Other Ambulatory Visit: Payer: Self-pay

## 2019-08-30 ENCOUNTER — Other Ambulatory Visit: Payer: Medicare PPO

## 2019-08-30 DIAGNOSIS — E038 Other specified hypothyroidism: Secondary | ICD-10-CM

## 2019-08-31 LAB — T4, FREE: Free T4: 1.17 ng/dL (ref 0.82–1.77)

## 2019-08-31 LAB — TSH: TSH: 3.49 u[IU]/mL (ref 0.450–4.500)

## 2019-08-31 NOTE — Progress Notes (Signed)
Contacted via MyChart  Good morning Kirsten Riley.  Your thyroid levels are now in normal range since medication change.  Much improved.  I would continue current dosing for now and will recheck next visit. Keep being awesome!!  Thank you for allowing me to participate in your care. Kindest regards, Carnelius Hammitt

## 2019-10-16 ENCOUNTER — Other Ambulatory Visit: Payer: Self-pay

## 2019-10-16 ENCOUNTER — Encounter: Payer: Self-pay | Admitting: Nurse Practitioner

## 2019-10-16 ENCOUNTER — Ambulatory Visit (INDEPENDENT_AMBULATORY_CARE_PROVIDER_SITE_OTHER): Payer: Medicare PPO | Admitting: Nurse Practitioner

## 2019-10-16 VITALS — BP 116/71 | HR 58 | Temp 98.1°F | Ht 60.0 in | Wt 118.0 lb

## 2019-10-16 DIAGNOSIS — E1169 Type 2 diabetes mellitus with other specified complication: Secondary | ICD-10-CM | POA: Diagnosis not present

## 2019-10-16 DIAGNOSIS — M810 Age-related osteoporosis without current pathological fracture: Secondary | ICD-10-CM | POA: Diagnosis not present

## 2019-10-16 DIAGNOSIS — Z23 Encounter for immunization: Secondary | ICD-10-CM

## 2019-10-16 DIAGNOSIS — E119 Type 2 diabetes mellitus without complications: Secondary | ICD-10-CM

## 2019-10-16 DIAGNOSIS — E038 Other specified hypothyroidism: Secondary | ICD-10-CM

## 2019-10-16 DIAGNOSIS — E785 Hyperlipidemia, unspecified: Secondary | ICD-10-CM

## 2019-10-16 LAB — BAYER DCA HB A1C WAIVED: HB A1C (BAYER DCA - WAIVED): 6.8 % (ref <7.0)

## 2019-10-16 MED ORDER — LOVASTATIN 40 MG PO TABS: 40.0000 mg | ORAL_TABLET | Freq: Every day | ORAL | 4 refills | Status: DC

## 2019-10-16 NOTE — Assessment & Plan Note (Signed)
Chronic, ongoing.  Continue current medication regimen and adjust as needed.  Can consider discontinuation of this if patient wishes to minimize medications in future.

## 2019-10-16 NOTE — Patient Instructions (Signed)
Carbohydrate Counting for Diabetes Mellitus, Adult  Carbohydrate counting is a method of keeping track of how many carbohydrates you eat. Eating carbohydrates naturally increases the amount of sugar (glucose) in the blood. Counting how many carbohydrates you eat helps keep your blood glucose within normal limits, which helps you manage your diabetes (diabetes mellitus). It is important to know how many carbohydrates you can safely have in each meal. This is different for every person. A diet and nutrition specialist (registered dietitian) can help you make a meal plan and calculate how many carbohydrates you should have at each meal and snack. Carbohydrates are found in the following foods:  Grains, such as breads and cereals.  Dried beans and soy products.  Starchy vegetables, such as potatoes, peas, and corn.  Fruit and fruit juices.  Milk and yogurt.  Sweets and snack foods, such as cake, cookies, candy, chips, and soft drinks. How do I count carbohydrates? There are two ways to count carbohydrates in food. You can use either of the methods or a combination of both. Reading "Nutrition Facts" on packaged food The "Nutrition Facts" list is included on the labels of almost all packaged foods and beverages in the [place] It includes:  The serving size.  Information about nutrients in each serving, including the grams (g) of carbohydrate per serving. To use the "Nutrition Facts":  Decide how many servings you will have.  Multiply the number of servings by the number of carbohydrates per serving.  The resulting number is the total amount of carbohydrates that you will be having. Learning standard serving sizes of other foods When you eat carbohydrate foods that are not packaged or do not include "Nutrition Facts" on the label, you need to measure the servings in order to count the amount of carbohydrates:  Measure the foods that you will eat with a food scale or measuring cup, if  needed.  Decide how many standard-size servings you will eat.  Multiply the number of servings by 15. Most carbohydrate-rich foods have about 15 g of carbohydrates per serving. ? For example, if you eat 8 oz (170 g) of strawberries, you will have eaten 2 servings and 30 g of carbohydrates (2 servings x 15 g = 30 g).  For foods that have more than one food mixed, such as soups and casseroles, you must count the carbohydrates in each food that is included. The following list contains standard serving sizes of common carbohydrate-rich foods. Each of these servings has about 15 g of carbohydrates:   hamburger bun or  English muffin.   oz (15 mL) syrup.   oz (14 g) jelly.  1 slice of bread.  1 six-inch tortilla.  3 oz (85 g) cooked rice or pasta.  4 oz (113 g) cooked dried beans.  4 oz (113 g) starchy vegetable, such as peas, corn, or potatoes.  4 oz (113 g) hot cereal.  4 oz (113 g) mashed potatoes or  of a large baked potato.  4 oz (113 g) canned or frozen fruit.  4 oz (120 mL) fruit juice.  4-6 crackers.  6 chicken nuggets.  6 oz (170 g) unsweetened dry cereal.  6 oz (170 g) plain fat-free yogurt or yogurt sweetened with artificial sweeteners.  8 oz (240 mL) milk.  8 oz (170 g) fresh fruit or one small piece of fruit.  24 oz (680 g) popped popcorn. Example of carbohydrate counting Sample meal  3 oz (85 g) chicken breast.  6 oz (170 g)   brown rice.  4 oz (113 g) corn.  8 oz (240 mL) milk.  8 oz (170 g) strawberries with sugar-free whipped topping. Carbohydrate calculation 1. Identify the foods that contain carbohydrates: ? Rice. ? Corn. ? Milk. ? Strawberries. 2. Calculate how many servings you have of each food: ? 2 servings rice. ? 1 serving corn. ? 1 serving milk. ? 1 serving strawberries. 3. Multiply each number of servings by 15 g: ? 2 servings rice x 15 g = 30 g. ? 1 serving corn x 15 g = 15 g. ? 1 serving milk x 15 g = 15 g. ? 1  serving strawberries x 15 g = 15 g. 4. Add together all of the amounts to find the total grams of carbohydrates eaten: ? 30 g + 15 g + 15 g + 15 g = 75 g of carbohydrates total. Summary  Carbohydrate counting is a method of keeping track of how many carbohydrates you eat.  Eating carbohydrates naturally increases the amount of sugar (glucose) in the blood.  Counting how many carbohydrates you eat helps keep your blood glucose within normal limits, which helps you manage your diabetes.  A diet and nutrition specialist (registered dietitian) can help you make a meal plan and calculate how many carbohydrates you should have at each meal and snack. This information is not intended to replace advice given to you by your health care provider. Make sure you discuss any questions you have with your health care provider. Document Revised: 07/23/2016 Document Reviewed: 06/12/2015 Elsevier Patient Education  2020 [employer] Inc.  

## 2019-10-16 NOTE — Assessment & Plan Note (Signed)
Chronic, stable, diet-controlled.  A1C today 6.6%. Urine micro 10 and A:C <30 last visit.   Recommend continued focus on diet.  Due to advanced age more lenient goal recommended, A1C <8.  Avoid hypoglycemia.  Return in 6 months.

## 2019-10-16 NOTE — Assessment & Plan Note (Signed)
Chronic, stable with Fosamax.  Previously monitored by rheumatology, no longer needed.  Is to follow-up with PCP.  Wishes to continued Fosamax at this time.  Consider holiday in future as has been on medication for 3 years or more.  Vit D level today.

## 2019-10-16 NOTE — Progress Notes (Signed)
BP 116/71 (BP Location: Left Arm, Patient Position: Sitting, Cuff Size: Normal)   Pulse (!) 58   Temp 98.1 F (36.7 C) (Oral)   Ht 5' (1.524 m)   Wt 118 lb (53.5 kg)   LMP  (LMP Unknown)   SpO2 97%   BMI 23.05 kg/m    Subjective:    Patient ID: Kirsten Riley, female    DOB: Sep 29, 1931, 86 y.o.   MRN: [id number]  HPI: Kirsten Riley is a 86 y.o. female  Chief Complaint  Patient presents with  . Diabetes  . Hypothyroidism  . Follow-up   DIABETES No current medications, continues on diet control with recent A1C in March 7%.   Hypoglycemic episodes:no Polydipsia/polyuria: no Visual disturbance: no Chest pain: no Paresthesias: no Glucose Monitoring: no             Accucheck frequency: Not Checking             Fasting glucose:             Post prandial:             Evening:             Before meals: Taking Insulin?: no             Long acting insulin:             Short acting insulin: Blood Pressure Monitoring: not checking Retinal Examination: Up to Date Foot Exam: Up to Date Pneumovax: Up to date Influenza: Up to Date Aspirin: no   HYPOTHYROIDISM Continues on Levothyroxine 50 MCG daily.  Last  TSH 3.490. Thyroid control status:stable Satisfied with current treatment? yes Medication side effects: no Medication compliance: good compliance Etiology of hypothyroidism:  Recent dose adjustment: yes, lower levels on TSH after Covid vaccine noted Fatigue: a little bit Cold intolerance: no Heat intolerance: no Weight gain: no Weight loss: no Constipation: no Diarrhea/loose stools: no Palpitations: no Lower extremity edema: no Anxiety/depressed mood: no   HYPERLIPIDEMIA Continues on Lovastatin 40 MG daily. Hyperlipidemia status: good compliance Satisfied with current treatment?  no Side effects:  no Medication compliance: good compliance Supplements: none Aspirin:  no The ASCVD Risk score Denman George DC Jr., et al., 2013) failed to calculate for the following  reasons:   The 2013 ASCVD risk score is only valid for ages 63 to 73   OSTEOPOROSIS: Last DEXA 03/06/2019 with T score -3.1.  She is followed by Dr. Gavin Potters at [facility] for RA and osteoporosis, last visit in February 2021.  Continues on Fosamax weekly and Vitamin D.   Dr. Gavin Potters told her she does not need to come back unless needed, this is noted on review of note as well.  On review of note can determine need for continuation of Fosamax vs holiday, discussed with patient and she wishes to continue at this time -- has had 3 or more years of this. She was taken off Methotrexate in September.  Relevant past medical, surgical, family and social history reviewed and updated as indicated. Interim medical history since our last visit reviewed. Allergies and medications reviewed and updated.  Review of Systems  Constitutional: Negative for activity change, appetite change, diaphoresis, fatigue and fever.  Respiratory: Negative for cough, chest tightness and shortness of breath.   Cardiovascular: Negative for chest pain, palpitations and leg swelling.  Gastrointestinal: Negative for abdominal distention, abdominal pain, constipation, diarrhea, nausea and vomiting.  Endocrine: Negative for cold intolerance, heat intolerance, polydipsia, polyphagia and polyuria.  Neurological: Negative for dizziness, syncope, weakness, light-headedness, numbness and headaches.  Psychiatric/Behavioral: Negative.   All other systems reviewed and are negative.   Per HPI unless specifically indicated above     Objective:    BP 116/71 (BP Location: Left Arm, Patient Position: Sitting, Cuff Size: Normal)   Pulse (!) 58   Temp 98.1 F (36.7 C) (Oral)   Ht 5' (1.524 m)   Wt 118 lb (53.5 kg)   LMP  (LMP Unknown)   SpO2 97%   BMI 23.05 kg/m   Wt Readings from Last 3 Encounters:  10/16/19 118 lb (53.5 kg)  05/31/19 123 lb (55.8 kg)  04/11/19 123 lb (55.8 kg)    Physical Exam Vitals and nursing note reviewed.   Constitutional:      General: She is awake. She is not in acute distress.    Appearance: She is well-developed. She is not ill-appearing.  HENT:     Head: Normocephalic.     Right Ear: Hearing normal.     Left Ear: Hearing normal.  Eyes:     General: Lids are normal.        Right eye: No discharge.        Left eye: No discharge.     Conjunctiva/sclera: Conjunctivae normal.     Pupils: Pupils are equal, round, and reactive to light.  Neck:     Thyroid: No thyromegaly.     Vascular: No carotid bruit.  Cardiovascular:     Rate and Rhythm: Normal rate and regular rhythm.     Heart sounds: Normal heart sounds. No murmur heard.  No gallop.   Pulmonary:     Effort: Pulmonary effort is normal. No accessory muscle usage or respiratory distress.     Breath sounds: Normal breath sounds.  Abdominal:     General: Bowel sounds are normal.     Palpations: Abdomen is soft.  Musculoskeletal:     Cervical back: Normal range of motion and neck supple.     Right lower leg: No edema.     Left lower leg: No edema.  Skin:    General: Skin is warm and dry.  Neurological:     Mental Status: She is alert and oriented to person, place, and time.  Psychiatric:        Attention and Perception: Attention normal.        Mood and Affect: Mood normal.        Behavior: Behavior normal. Behavior is cooperative.        Thought Content: Thought content normal.        Judgment: Judgment normal.     Results for orders placed or performed in visit on 08/30/19  T4, free  Result Value Ref Range   Free T4 1.17 0.82 - 1.77 ng/dL  TSH  Result Value Ref Range   TSH 3.490 0.450 - 4.500 uIU/mL      Assessment & Plan:   Problem List Items Addressed This Visit      Endocrine   Hyperlipidemia associated with type 2 diabetes mellitus (HCC)    Chronic, ongoing.  Continue current medication regimen and adjust as needed.  Can consider discontinuation of this if patient wishes to minimize medications in  future.      Relevant Medications   lovastatin (MEVACOR) 40 MG tablet   Other Relevant Orders   Lipid Panel w/o Chol/HDL Ratio   Hypothyroidism    Chronic, ongoing.  Continue current medication regimen and adjust as needed.  TSH  today to ensure not trending lower -- if stable may return to her 75 MCG regimen which she prefers and feels better with, discussed with patient.  Will need script sent once labs returned.      Relevant Orders   TSH   T4, free   Type 2 diabetes, diet controlled (HCC) - Primary    Chronic, stable, diet-controlled.  A1C today 6.6%. Urine micro 10 and A:C <30 last visit.   Recommend continued focus on diet.  Due to advanced age more lenient goal recommended, A1C <8.  Avoid hypoglycemia.  Return in 6 months.      Relevant Medications   lovastatin (MEVACOR) 40 MG tablet   Other Relevant Orders   Basic metabolic panel   Bayer DCA Hb T4H Waived     Musculoskeletal and Integument   Osteoporosis    Chronic, stable with Fosamax.  Previously monitored by rheumatology, no longer needed.  Is to follow-up with PCP.  Wishes to continued Fosamax at this time.  Consider holiday in future as has been on medication for 3 years or more.  Vit D level today.      Relevant Orders   VITAMIN D 25 Hydroxy (Vit-D Deficiency, Fractures)    Other Visit Diagnoses    Need for influenza vaccination       Relevant Orders   Flu Vaccine QUAD High Dose(Fluad) (Completed)       Follow up plan: Return in about 6 months (around 04/15/2020) for T2DM, Osteoporosis, HLD, Thyroid.

## 2019-10-16 NOTE — Assessment & Plan Note (Signed)
Chronic, ongoing.  Continue current medication regimen and adjust as needed.  TSH today to ensure not trending lower -- if stable may return to her 75 MCG regimen which she prefers and feels better with, discussed with patient.  Will need script sent once labs returned.

## 2019-10-17 ENCOUNTER — Other Ambulatory Visit: Payer: Self-pay | Admitting: Nurse Practitioner

## 2019-10-17 DIAGNOSIS — E038 Other specified hypothyroidism: Secondary | ICD-10-CM

## 2019-10-17 LAB — LIPID PANEL W/O CHOL/HDL RATIO
Cholesterol, Total: 127 mg/dL (ref 100–199)
HDL: 44 mg/dL (ref >39)
LDL Chol Calc (NIH): 65 mg/dL (ref 0–99)
Triglycerides: 97 mg/dL (ref 0–149)
VLDL Cholesterol Cal: 18 mg/dL (ref 5–40)

## 2019-10-17 LAB — T4, FREE: Free T4: 1.22 ng/dL (ref 0.82–1.77)

## 2019-10-17 LAB — BASIC METABOLIC PANEL
BUN/Creatinine Ratio: 14 (ref 12–28)
BUN: 13 mg/dL (ref 8–27)
CO2: 19 mmol/L — ABNORMAL LOW (ref 20–29)
Calcium: 9.4 mg/dL (ref 8.7–10.3)
Chloride: 105 mmol/L (ref 96–106)
Creatinine, Ser: 0.93 mg/dL (ref 0.57–1.00)
GFR calc Af Amer: 64 mL/min/{1.73_m2} (ref >59)
GFR calc non Af Amer: 56 mL/min/{1.73_m2} — ABNORMAL LOW (ref >59)
Glucose: 136 mg/dL — ABNORMAL HIGH (ref 65–99)
Potassium: 4.4 mmol/L (ref 3.5–5.2)
Sodium: 140 mmol/L (ref 134–144)

## 2019-10-17 LAB — VITAMIN D 25 HYDROXY (VIT D DEFICIENCY, FRACTURES): Vit D, 25-Hydroxy: 33.3 ng/mL (ref 30.0–100.0)

## 2019-10-17 LAB — TSH: TSH: 9.32 u[IU]/mL — ABNORMAL HIGH (ref 0.450–4.500)

## 2019-10-17 MED ORDER — LEVOTHYROXINE SODIUM 75 MCG PO TABS
75.0000 ug | ORAL_TABLET | Freq: Every day | ORAL | 3 refills | Status: DC
Start: 2019-10-17 — End: 2020-10-08

## 2019-10-17 NOTE — Progress Notes (Signed)
Contacted via MyChart  Good afternoon Kirsten Riley, your labs have returned and thyroid level has increased again -- which I suspected it may.  We can return to 75 MCG daily of your Levothyroxine.  I will send this in, stop the 50 MCG dosing and please schedule a lab only visit for 6 weeks so we can recheck labs.  Kidneys continue to show some very mild kidney disease, but this is stable.  Cholesterol levels look great!!  Any questions? Keep being awesome!!  Thank you for allowing me to participate in your care. Kindest regards, Jaci Desanto

## 2019-11-30 ENCOUNTER — Other Ambulatory Visit: Payer: Self-pay

## 2019-11-30 ENCOUNTER — Other Ambulatory Visit: Payer: Medicare PPO

## 2019-11-30 DIAGNOSIS — E038 Other specified hypothyroidism: Secondary | ICD-10-CM

## 2019-12-01 LAB — TSH: TSH: 1.25 u[IU]/mL (ref 0.450–4.500)

## 2019-12-01 LAB — T4, FREE: Free T4: 1.62 ng/dL (ref 0.82–1.77)

## 2019-12-01 NOTE — Progress Notes (Signed)
Contacted via MyChart  Good morning Kirsten Riley, I have good news.  Your thyroid labs are now back to normal.  You can continue your Levothyroxine 75 MCG and if refills needed let me know.  We will have to monitor closely with future Covid shots, as suspect this caused a little offset of your levels.  If any questions let me know:) Keep being awesome!!  Thank you for allowing me to participate in your care. Kindest regards, Lanora Reveron

## 2019-12-04 ENCOUNTER — Telehealth: Payer: Self-pay

## 2019-12-04 NOTE — Telephone Encounter (Signed)
Form placed in providers folder to be signed when she returns to the office.

## 2019-12-04 NOTE — Telephone Encounter (Signed)
Pt. brought in placard to be filled out. Form left in bin to e reviewed.

## 2019-12-12 NOTE — Telephone Encounter (Signed)
Form signed by Corrie Dandy. Called and LVM letting patient know that her form was ready to be picked up.   Copy placed in scan bin and original placed up front for patient to pick up.

## 2020-02-29 ENCOUNTER — Telehealth: Payer: Self-pay

## 2020-02-29 NOTE — Telephone Encounter (Signed)
Routing to provider to advise.  

## 2020-02-29 NOTE — Telephone Encounter (Signed)
Copied from CRM [id number]. Topic: General - Other >> Feb 29, 2020  3:39 PM Mcneil, Ja-Kwan wrote: Reason for CRM: Pt stated she went to the dentist today and she was told she should not be taking the alendronate (FOSAMAX) 70 MG tablet. Pt requests call back from Jolene to discuss.   Would pt need apt?

## 2020-03-01 NOTE — Telephone Encounter (Signed)
At this time is having jaw issues and having to go to [facility], reviewed last rheumatology note with Dr. Gavin Potters and recent DEXA -- discussed with patient.  At this time will take a holiday from Fosamax and repeat DEXA next year, if ongoing osteoporosis noted then and any worsening then consider Prolia.

## 2020-04-12 ENCOUNTER — Encounter: Payer: Self-pay | Admitting: Nurse Practitioner

## 2020-04-12 DIAGNOSIS — N183 Chronic kidney disease, stage 3 unspecified: Secondary | ICD-10-CM | POA: Insufficient documentation

## 2020-04-15 ENCOUNTER — Other Ambulatory Visit: Payer: Self-pay

## 2020-04-15 ENCOUNTER — Ambulatory Visit: Payer: Medicare PPO | Admitting: Nurse Practitioner

## 2020-04-15 ENCOUNTER — Encounter: Payer: Self-pay | Admitting: Nurse Practitioner

## 2020-04-15 VITALS — BP 134/71 | HR 55 | Temp 97.8°F | Wt 117.6 lb

## 2020-04-15 DIAGNOSIS — M353 Polymyalgia rheumatica: Secondary | ICD-10-CM

## 2020-04-15 DIAGNOSIS — E1169 Type 2 diabetes mellitus with other specified complication: Secondary | ICD-10-CM | POA: Diagnosis not present

## 2020-04-15 DIAGNOSIS — M0609 Rheumatoid arthritis without rheumatoid factor, multiple sites: Secondary | ICD-10-CM | POA: Diagnosis not present

## 2020-04-15 DIAGNOSIS — E119 Type 2 diabetes mellitus without complications: Secondary | ICD-10-CM

## 2020-04-15 DIAGNOSIS — M810 Age-related osteoporosis without current pathological fracture: Secondary | ICD-10-CM

## 2020-04-15 DIAGNOSIS — N1831 Chronic kidney disease, stage 3a: Secondary | ICD-10-CM

## 2020-04-15 DIAGNOSIS — E785 Hyperlipidemia, unspecified: Secondary | ICD-10-CM

## 2020-04-15 DIAGNOSIS — E038 Other specified hypothyroidism: Secondary | ICD-10-CM

## 2020-04-15 LAB — MICROALBUMIN, URINE WAIVED
Creatinine, Urine Waived: 10 mg/dL (ref 10–300)
Microalb, Ur Waived: 10 mg/L (ref 0–19)
Microalb/Creat Ratio: <30 mg/g (ref <30)

## 2020-04-15 LAB — BAYER DCA HB A1C WAIVED: HB A1C (BAYER DCA - WAIVED): 7.3 % — ABNORMAL HIGH (ref <7.0)

## 2020-04-15 NOTE — Assessment & Plan Note (Signed)
Chronic, stable, diet-controlled.  A1C today 7.3%. Urine micro 10 and A:C <30 today.   Recommend continued focus on diet.  Due to advanced age more lenient goal recommended, A1C <8.  Avoid hypoglycemia.  Return in 6 months.

## 2020-04-15 NOTE — Assessment & Plan Note (Signed)
Chronic, ongoing.  Previously monitored by rheumatology, no longer needed.  Is to follow-up with PCP.  Holiday from Fosamax at this time, has has been on medication for 3 years or more.  Plan for repeat DEXA February 2023.

## 2020-04-15 NOTE — Assessment & Plan Note (Signed)
Chronic, ongoing.  Continue current medication regimen and adjust as needed.  TSH and Free T4 today.  Refills as needed.

## 2020-04-15 NOTE — Assessment & Plan Note (Signed)
Noted on recent labs and educated patient on this finding, monitor medications.  CMP today.

## 2020-04-15 NOTE — Patient Instructions (Signed)
Diabetes Mellitus and Nutrition, Adult When you have diabetes, or diabetes mellitus, it is very important to have healthy eating habits because your blood sugar (glucose) levels are greatly affected by what you eat and drink. Eating healthy foods in the right amounts, at about the same times every day, can help you:  Control your blood glucose.  Lower your risk of heart disease.  Improve your blood pressure.  Reach or maintain a healthy weight. What can affect my meal plan? Every person with diabetes is different, and each person has different needs for a meal plan. Your health care provider may recommend that you work with a dietitian to make a meal plan that is best for you. Your meal plan may vary depending on factors such as:  The calories you need.  The medicines you take.  Your weight.  Your blood glucose, blood pressure, and cholesterol levels.  Your activity level.  Other health conditions you have, such as heart or kidney disease. How do carbohydrates affect me? Carbohydrates, also called carbs, affect your blood glucose level more than any other type of food. Eating carbs naturally raises the amount of glucose in your blood. Carb counting is a method for keeping track of how many carbs you eat. Counting carbs is important to keep your blood glucose at a healthy level, especially if you use insulin or take certain oral diabetes medicines. It is important to know how many carbs you can safely have in each meal. This is different for every person. Your dietitian can help you calculate how many carbs you should have at each meal and for each snack. How does alcohol affect me? Alcohol can cause a sudden decrease in blood glucose (hypoglycemia), especially if you use insulin or take certain oral diabetes medicines. Hypoglycemia can be a life-threatening condition. Symptoms of hypoglycemia, such as sleepiness, dizziness, and confusion, are similar to symptoms of having too much  alcohol.  Do not drink alcohol if: ? Your health care provider tells you not to drink. ? You are pregnant, may be pregnant, or are planning to become pregnant.  If you drink alcohol: ? Do not drink on an empty stomach. ? Limit how much you use to:  0-1 drink a day for women.  0-2 drinks a day for men. ? Be aware of how much alcohol is in your drink. In the [place] one drink equals one 12 oz bottle of beer (355 mL), one 5 oz glass of wine (148 mL), or one 1 oz glass of hard liquor (44 mL). ? Keep yourself hydrated with water, diet soda, or unsweetened iced tea.  Keep in mind that regular soda, juice, and other mixers may contain a lot of sugar and must be counted as carbs. What are tips for following this plan? Reading food labels  Start by checking the serving size on the "Nutrition Facts" label of packaged foods and drinks. The amount of calories, carbs, fats, and other nutrients listed on the label is based on one serving of the item. Many items contain more than one serving per package.  Check the total grams (g) of carbs in one serving. You can calculate the number of servings of carbs in one serving by dividing the total carbs by 15. For example, if a food has 30 g of total carbs per serving, it would be equal to 2 servings of carbs.  Check the number of grams (g) of saturated fats and trans fats in one serving. Choose foods that have   a low amount or none of these fats.  Check the number of milligrams (mg) of salt (sodium) in one serving. Most people should limit total sodium intake to less than 2,300 mg per day.  Always check the nutrition information of foods labeled as "low-fat" or "nonfat." These foods may be higher in added sugar or refined carbs and should be avoided.  Talk to your dietitian to identify your daily goals for nutrients listed on the label. Shopping  Avoid buying canned, pre-made, or processed foods. These foods tend to be high in fat, sodium, and added  sugar.  Shop around the outside edge of the grocery store. This is where you will most often find fresh fruits and vegetables, bulk grains, fresh meats, and fresh dairy. Cooking  Use low-heat cooking methods, such as baking, instead of high-heat cooking methods like deep frying.  Cook using healthy oils, such as olive, canola, or sunflower oil.  Avoid cooking with butter, cream, or high-fat meats. Meal planning  Eat meals and snacks regularly, preferably at the same times every day. Avoid going long periods of time without eating.  Eat foods that are high in fiber, such as fresh fruits, vegetables, beans, and whole grains. Talk with your dietitian about how many servings of carbs you can eat at each meal.  Eat 4-6 oz (112-168 g) of lean protein each day, such as lean meat, chicken, fish, eggs, or tofu. One ounce (oz) of lean protein is equal to: ? 1 oz (28 g) of meat, chicken, or fish. ? 1 egg. ?  cup (62 g) of tofu.  Eat some foods each day that contain healthy fats, such as avocado, nuts, seeds, and fish.   What foods should I eat? Fruits Berries. Apples. Oranges. Peaches. Apricots. Plums. Grapes. Mango. Papaya. Pomegranate. Kiwi. Cherries. Vegetables Lettuce. Spinach. Leafy greens, including kale, chard, collard greens, and mustard greens. Beets. Cauliflower. Cabbage. Broccoli. Carrots. Green beans. Tomatoes. Peppers. Onions. Cucumbers. Brussels sprouts. Grains Whole grains, such as whole-wheat or whole-grain bread, crackers, tortillas, cereal, and pasta. Unsweetened oatmeal. Quinoa. Brown or wild rice. Meats and other proteins Seafood. Poultry without skin. Lean cuts of poultry and beef. Tofu. Nuts. Seeds. Dairy Low-fat or fat-free dairy products such as milk, yogurt, and cheese. The items listed above may not be a complete list of foods and beverages you can eat. Contact a dietitian for more information. What foods should I avoid? Fruits Fruits canned with  syrup. Vegetables Canned vegetables. Frozen vegetables with butter or cream sauce. Grains Refined white flour and flour products such as bread, pasta, snack foods, and cereals. Avoid all processed foods. Meats and other proteins Fatty cuts of meat. Poultry with skin. Breaded or fried meats. Processed meat. Avoid saturated fats. Dairy Full-fat yogurt, cheese, or milk. Beverages Sweetened drinks, such as soda or iced tea. The items listed above may not be a complete list of foods and beverages you should avoid. Contact a dietitian for more information. Questions to ask a health care provider  Do I need to meet with a diabetes educator?  Do I need to meet with a dietitian?  What number can I call if I have questions?  When are the best times to check my blood glucose? Where to find more information:  American Diabetes Association: diabetes.org  Academy of Nutrition and Dietetics: www.eatright.org  National Institute of Diabetes and Digestive and Kidney Diseases: www.niddk.nih.gov  Association of Diabetes Care and Education Specialists: www.diabeteseducator.org Summary  It is important to have healthy eating   habits because your blood sugar (glucose) levels are greatly affected by what you eat and drink.  A healthy meal plan will help you control your blood glucose and maintain a healthy lifestyle.  Your health care provider may recommend that you work with a dietitian to make a meal plan that is best for you.  Keep in mind that carbohydrates (carbs) and alcohol have immediate effects on your blood glucose levels. It is important to count carbs and to use alcohol carefully. This information is not intended to replace advice given to you by your health care provider. Make sure you discuss any questions you have with your health care provider. Document Revised: 12/06/2018 Document Reviewed: 12/06/2018 Elsevier Patient Education  2021 [employer]  

## 2020-04-15 NOTE — Assessment & Plan Note (Signed)
Chronic, stable.  No further medications.  Is to return to rheumatology as needed.  At this time is asymptomatic. 

## 2020-04-15 NOTE — Progress Notes (Addendum)
BP 134/71   Pulse (!) 55   Temp 97.8 F (36.6 C) (Oral)   Wt 117 lb 9.6 oz (53.3 kg)   LMP  (LMP Unknown)   SpO2 97%   BMI 22.97 kg/m    Subjective:    Patient ID: Kirsten Riley, female    DOB: 1931/09/07, 87 y.o.   MRN: [id number]  HPI: Kirsten Riley is a 87 y.o. female  Chief Complaint  Patient presents with  . Diabetes  . Osteoporosis  . Hyperlipidemia  . Thyroid Problem  . Follow-up    Patient states she would like the provider that she is having dental procedures next month at [facility].    DIABETES No current medications, continues on diet control with recent A1C in October 6.8%.   Hypoglycemic episodes:no Polydipsia/polyuria: no Visual disturbance: no Chest pain: no Paresthesias: no Glucose Monitoring: no             Accucheck frequency: Not Checking             Fasting glucose:             Post prandial:             Evening:             Before meals: Taking Insulin?: no             Long acting insulin:             Short acting insulin: Blood Pressure Monitoring: not checking Retinal Examination: Up to Date -- January or February, Dr. Wallace Going Foot Exam: Up to Date Pneumovax: Up to date Influenza: Up to Date Aspirin: no   CHRONIC KIDNEY DISEASE Last labs in October 2021 noted eGFR 56.   CKD status: stable Medications renally dose: yes Previous renal evaluation: no Pneumovax:  Up to Date Influenza Vaccine:  Up to Date  HYPOTHYROIDISM Continues on Levothyroxine 50 MCG daily.  Last  TSH 1.250 in October Thyroid control status:stable Satisfied with current treatment? yes Medication side effects: no Medication compliance: good compliance Etiology of hypothyroidism:  Recent dose adjustment: yes, lower levels on TSH after Covid vaccine noted Fatigue: a little bit Cold intolerance: no Heat intolerance: no Weight gain: no Weight loss: no Constipation: no Diarrhea/loose stools: no Palpitations: no Lower extremity edema: no Anxiety/depressed mood: no    HYPERLIPIDEMIA Continues on Lovastatin 40 MG daily. Hyperlipidemia status: good compliance Satisfied with current treatment?  no Side effects:  no Medication compliance: good compliance Supplements: none Aspirin:  no The ASCVD Risk score Kirsten Bussing DC Jr., et al., 2013) failed to calculate for the following reasons:   The 2013 ASCVD risk score is only valid for ages 46 to 64   [place]: Last DEXA 03/06/2019 with T score -3.1.  She was followed by Dr. Jefm Bryant at [facility] for RA and osteoporosis, last visit in February 2021.  Dr. Jefm Bryant told her she does not need to come back unless needed, this is noted on review of note as well.   Continues on Vitamin D, Fosamax recently discontinued due to jaw and dentition issues which she is going to see specialist at [facility] for.   On review of last rheum note can determine need for continuation of Fosamax vs holiday, discussed with patient, had 3 or more years of this. She was taken off Methotrexate in September.  Relevant past medical, surgical, family and social history reviewed and updated as indicated. Interim medical history since our last visit reviewed.  Allergies and medications reviewed and updated.  Review of Systems  Constitutional: Negative for activity change, appetite change, diaphoresis, fatigue and fever.  Respiratory: Negative for cough, chest tightness and shortness of breath.   Cardiovascular: Negative for chest pain, palpitations and leg swelling.  Gastrointestinal: Negative for abdominal distention, abdominal pain, constipation, diarrhea, nausea and vomiting.  Endocrine: Negative for cold intolerance, heat intolerance, polydipsia, polyphagia and polyuria.  Neurological: Negative for dizziness, syncope, weakness, light-headedness, numbness and headaches.  Psychiatric/Behavioral: Negative.   All other systems reviewed and are negative.   Per HPI unless specifically indicated above     Objective:    BP  134/71   Pulse (!) 55   Temp 97.8 F (36.6 C) (Oral)   Wt 117 lb 9.6 oz (53.3 kg)   LMP  (LMP Unknown)   SpO2 97%   BMI 22.97 kg/m   Wt Readings from Last 3 Encounters:  04/15/20 117 lb 9.6 oz (53.3 kg)  10/16/19 118 lb (53.5 kg)  05/31/19 123 lb (55.8 kg)    Physical Exam Vitals and nursing note reviewed.  Constitutional:      General: She is awake. She is not in acute distress.    Appearance: She is well-developed. She is not ill-appearing.  HENT:     Head: Normocephalic.     Right Ear: Hearing normal.     Left Ear: Hearing normal.  Eyes:     General: Lids are normal.        Right eye: No discharge.        Left eye: No discharge.     Conjunctiva/sclera: Conjunctivae normal.     Pupils: Pupils are equal, round, and reactive to light.  Neck:     Thyroid: No thyromegaly.     Vascular: No carotid bruit.  Cardiovascular:     Rate and Rhythm: Normal rate and regular rhythm.     Heart sounds: Normal heart sounds. No murmur heard. No gallop.   Pulmonary:     Effort: Pulmonary effort is normal. No accessory muscle usage or respiratory distress.     Breath sounds: Normal breath sounds.  Abdominal:     General: Bowel sounds are normal.     Palpations: Abdomen is soft.  Musculoskeletal:     Cervical back: Normal range of motion and neck supple.     Right lower leg: No edema.     Left lower leg: No edema.  Skin:    General: Skin is warm and dry.  Neurological:     Mental Status: She is alert and oriented to person, place, and time.  Psychiatric:        Attention and Perception: Attention normal.        Mood and Affect: Mood normal.        Behavior: Behavior normal. Behavior is cooperative.        Thought Content: Thought content normal.        Judgment: Judgment normal.    Diabetic Foot Exam - Simple   Simple Foot Form Visual Inspection No deformities, no ulcerations, no other skin breakdown bilaterally: Yes Sensation Testing Intact to touch and monofilament  testing bilaterally: Yes Pulse Check Posterior Tibialis and Dorsalis pulse intact bilaterally: Yes Comments    Results for orders placed or performed in visit on 11/30/19  T4, free  Result Value Ref Range   Free T4 1.62 0.82 - 1.77 ng/dL  TSH  Result Value Ref Range   TSH 1.250 0.450 - 4.500 uIU/mL  Assessment & Plan:   Problem List Items Addressed This Visit      Endocrine   Hyperlipidemia associated with type 2 diabetes mellitus ([place])    Chronic, ongoing.  Continue current medication regimen and adjust as needed.  Can consider discontinuation of this if patient wishes to minimize medications in future.  Lipid panel today.      Relevant Orders   Comprehensive metabolic panel   Lipid Panel w/o Chol/HDL Ratio   Hypothyroidism    Chronic, ongoing.  Continue current medication regimen and adjust as needed.  TSH and Free T4 today.  Refills as needed.      Relevant Orders   T4, free   TSH   Type 2 diabetes, diet controlled ([place]) - Primary    Chronic, stable, diet-controlled.  A1C today 7.3%. Urine micro 10 and A:C <30 today.   Recommend continued focus on diet.  Due to advanced age more lenient goal recommended, A1C <8.  Avoid hypoglycemia.  Return in 6 months.      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  No further medications.  Is to return to rheumatology as needed.  At this time is asymptomatic.      Osteoporosis    Chronic, ongoing.  Previously monitored by rheumatology, no longer needed.  Is to follow-up with PCP.  Holiday from Fosamax at this time, has has been on medication for 3 years or more.  Plan for repeat DEXA February 2023.      Relevant Orders   VITAMIN D 25 Hydroxy (Vit-D Deficiency, Fractures)     Genitourinary   CKD (chronic kidney disease) stage 3, GFR 30-59 ml/min (HCC)    Noted on  recent labs and educated patient on this finding, monitor medications.  CMP today.        Other   PMR (polymyalgia rheumatica) (HCC)    Previously monitored by rheumatology with last visit in February 2021, symptoms have improved and she is to return as needed only.            Follow up plan: Return in about 6 months (around 10/15/2020) for T2DM, HTN/HLD, THYROID, OSTEOPOROSIS.

## 2020-04-15 NOTE — Assessment & Plan Note (Signed)
Previously monitored by rheumatology with last visit in February 2021, symptoms have improved and she is to return as needed only.   

## 2020-04-15 NOTE — Assessment & Plan Note (Signed)
Chronic, ongoing.  Continue current medication regimen and adjust as needed.  Can consider discontinuation of this if patient wishes to minimize medications in future.  Lipid panel today. 

## 2020-04-16 ENCOUNTER — Other Ambulatory Visit: Payer: Self-pay | Admitting: Nurse Practitioner

## 2020-04-16 DIAGNOSIS — E038 Other specified hypothyroidism: Secondary | ICD-10-CM

## 2020-04-16 LAB — COMPREHENSIVE METABOLIC PANEL
ALT: 14 IU/L (ref 0–32)
AST: 23 IU/L (ref 0–40)
Albumin/Globulin Ratio: 1.6 (ref 1.2–2.2)
Albumin: 4.4 g/dL (ref 3.6–4.6)
Alkaline Phosphatase: 138 IU/L — ABNORMAL HIGH (ref 44–121)
BUN/Creatinine Ratio: 14 (ref 12–28)
BUN: 12 mg/dL (ref 8–27)
Bilirubin Total: 0.9 mg/dL (ref 0.0–1.2)
CO2: 18 mmol/L — ABNORMAL LOW (ref 20–29)
Calcium: 9.5 mg/dL (ref 8.7–10.3)
Chloride: 101 mmol/L (ref 96–106)
Creatinine, Ser: 0.87 mg/dL (ref 0.57–1.00)
Globulin, Total: 2.7 g/dL (ref 1.5–4.5)
Glucose: 155 mg/dL — ABNORMAL HIGH (ref 65–99)
Potassium: 4 mmol/L (ref 3.5–5.2)
Sodium: 139 mmol/L (ref 134–144)
Total Protein: 7.1 g/dL (ref 6.0–8.5)
eGFR: 65 mL/min/{1.73_m2} (ref >59)

## 2020-04-16 LAB — T4, FREE: Free T4: 1.85 ng/dL — ABNORMAL HIGH (ref 0.82–1.77)

## 2020-04-16 LAB — LIPID PANEL W/O CHOL/HDL RATIO
Cholesterol, Total: 131 mg/dL (ref 100–199)
HDL: 44 mg/dL (ref >39)
LDL Chol Calc (NIH): 65 mg/dL (ref 0–99)
Triglycerides: 123 mg/dL (ref 0–149)
VLDL Cholesterol Cal: 22 mg/dL (ref 5–40)

## 2020-04-16 LAB — VITAMIN D 25 HYDROXY (VIT D DEFICIENCY, FRACTURES): Vit D, 25-Hydroxy: 31.2 ng/mL (ref 30.0–100.0)

## 2020-04-16 LAB — TSH: TSH: 0.287 u[IU]/mL — ABNORMAL LOW (ref 0.450–4.500)

## 2020-04-16 NOTE — Progress Notes (Signed)
Please let Kirsten Riley know her labs have returned and overall they remain stable with exception of thyroid levels which are showing lowering in TSH and elevation in Free T4 again, meaning she is leaning more hyperthyroid again.  Similar to in past after Covid vaccines.  I know she does not like dose changes, at this time we can continue current dose but I do want her to come in 6 weeks for lab repeat only, please schedule.  If it remains same then I would recommend we reduce medication dose.  Any questions or concerns?   Keep being awesome!!  Thank you for allowing me to participate in your care. Kindest regards, Mykhia Danish

## 2020-05-16 ENCOUNTER — Telehealth: Payer: Self-pay

## 2020-05-16 NOTE — Telephone Encounter (Signed)
Copied from CRM [id number]. Topic: General - Other >> May 16, 2020  4:28 PM Kirsten Riley wrote: Reason for CRM: Patient called in asking that her appointment on 06/03/20 be cancelled since she is going to [facility] on that day on [place]. Any question please call patient

## 2020-05-29 ENCOUNTER — Other Ambulatory Visit: Payer: Self-pay

## 2020-06-03 ENCOUNTER — Ambulatory Visit: Payer: Medicare PPO

## 2020-06-07 ENCOUNTER — Ambulatory Visit (INDEPENDENT_AMBULATORY_CARE_PROVIDER_SITE_OTHER): Payer: Medicare PPO

## 2020-06-07 VITALS — Ht 60.0 in | Wt 118.0 lb

## 2020-06-07 DIAGNOSIS — Z0000 Encounter for general adult medical examination without abnormal findings: Secondary | ICD-10-CM | POA: Diagnosis not present

## 2020-06-07 NOTE — Patient Instructions (Signed)
Ms. Kirsten Riley , Thank you for taking time to come for your Medicare Wellness Visit. I appreciate your ongoing commitment to your health goals. Please review the following plan we discussed and let me know if I can assist you in the future.   Screening recommendations/referrals: Colonoscopy: not required Mammogram: not required Bone Density: completed 03/05/2019 Recommended yearly ophthalmology/optometry visit for glaucoma screening and checkup Recommended yearly dental visit for hygiene and checkup  Vaccinations: Influenza vaccine: completed 10/16/2019, due 08/12/2020 Pneumococcal vaccine: completed 10/10/2015 Tdap vaccine: completed 10/18/2015, due 10/17/2025 Shingles vaccine: discussed   Covid-19: 03/13/2019, 02/13/2019  Advanced directives: DNR  Conditions/risks identified: none  Next appointment: Follow up in one year for your annual wellness visit    Preventive Care 65 Years and Older, Female Preventive care refers to lifestyle choices and visits with your health care provider that can promote health and wellness. What does preventive care include?  A yearly physical exam. This is also called an annual well check.  Dental exams once or twice a year.  Routine eye exams. Ask your health care provider how often you should have your eyes checked.  Personal lifestyle choices, including:  Daily care of your teeth and gums.  Regular physical activity.  Eating a healthy diet.  Avoiding tobacco and drug use.  Limiting alcohol use.  Practicing safe sex.  Taking low-dose aspirin every day.  Taking vitamin and mineral supplements as recommended by your health care provider. What happens during an annual well check? The services and screenings done by your health care provider during your annual well check will depend on your age, overall health, lifestyle risk factors, and family history of disease. Counseling  Your health care provider may ask you questions about your:  Alcohol  use.  Tobacco use.  Drug use.  Emotional well-being.  Home and relationship well-being.  Sexual activity.  Eating habits.  History of falls.  Memory and ability to understand (cognition).  Work and work Astronomer.  Reproductive health. Screening  You may have the following tests or measurements:  Height, weight, and BMI.  Blood pressure.  Lipid and cholesterol levels. These may be checked every 5 years, or more frequently if you are over 26 years old.  Skin check.  Lung cancer screening. You may have this screening every year starting at age 1 if you have a 30-pack-year history of smoking and currently smoke or have quit within the past 15 years.  Fecal occult blood test (FOBT) of the stool. You may have this test every year starting at age 84.  Flexible sigmoidoscopy or colonoscopy. You may have a sigmoidoscopy every 5 years or a colonoscopy every 10 years starting at age 40.  Hepatitis C blood test.  Hepatitis B blood test.  Sexually transmitted disease (STD) testing.  Diabetes screening. This is done by checking your blood sugar (glucose) after you have not eaten for a while (fasting). You may have this done every 1-3 years.  Bone density scan. This is done to screen for osteoporosis. You may have this done starting at age 48.  Mammogram. This may be done every 1-2 years. Talk to your health care provider about how often you should have regular mammograms. Talk with your health care provider about your test results, treatment options, and if necessary, the need for more tests. Vaccines  Your health care provider may recommend certain vaccines, such as:  Influenza vaccine. This is recommended every year.  Tetanus, diphtheria, and acellular pertussis (Tdap, Td) vaccine. You may need  a Td booster every 10 years.  Zoster vaccine. You may need this after age 79.  Pneumococcal 13-valent conjugate (PCV13) vaccine. One dose is recommended after age  37.  Pneumococcal polysaccharide (PPSV23) vaccine. One dose is recommended after age 77. Talk to your health care provider about which screenings and vaccines you need and how often you need them. This information is not intended to replace advice given to you by your health care provider. Make sure you discuss any questions you have with your health care provider. Document Released: 01/25/2015 Document Revised: 09/18/2015 Document Reviewed: 10/30/2014 Elsevier Interactive Patient Education  2017 [place] Prevention in the Home Falls can cause injuries. They can happen to people of all ages. There are many things you can do to make your home safe and to help prevent falls. What can I do on the outside of my home?  Regularly fix the edges of walkways and driveways and fix any cracks.  Remove anything that might make you trip as you walk through a door, such as a raised step or threshold.  Trim any bushes or trees on the path to your home.  Use bright outdoor lighting.  Clear any walking paths of anything that might make someone trip, such as rocks or tools.  Regularly check to see if handrails are loose or broken. Make sure that both sides of any steps have handrails.  Any raised decks and porches should have guardrails on the edges.  Have any leaves, snow, or ice cleared regularly.  Use sand or salt on walking paths during winter.  Clean up any spills in your garage right away. This includes oil or grease spills. What can I do in the bathroom?  Use night lights.  Install grab bars by the toilet and in the tub and shower. Do not use towel bars as grab bars.  Use non-skid mats or decals in the tub or shower.  If you need to sit down in the shower, use a plastic, non-slip stool.  Keep the floor dry. Clean up any water that spills on the floor as soon as it happens.  Remove soap buildup in the tub or shower regularly.  Attach bath mats securely with double-sided  non-slip rug tape.  Do not have throw rugs and other things on the floor that can make you trip. What can I do in the bedroom?  Use night lights.  Make sure that you have a light by your bed that is easy to reach.  Do not use any sheets or blankets that are too big for your bed. They should not hang down onto the floor.  Have a firm chair that has side arms. You can use this for support while you get dressed.  Do not have throw rugs and other things on the floor that can make you trip. What can I do in the kitchen?  Clean up any spills right away.  Avoid walking on wet floors.  Keep items that you use a lot in easy-to-reach places.  If you need to reach something above you, use a strong step stool that has a grab bar.  Keep electrical cords out of the way.  Do not use floor polish or wax that makes floors slippery. If you must use wax, use non-skid floor wax.  Do not have throw rugs and other things on the floor that can make you trip. What can I do with my stairs?  Do not leave any items on the  stairs.  Make sure that there are handrails on both sides of the stairs and use them. Fix handrails that are broken or loose. Make sure that handrails are as long as the stairways.  Check any carpeting to make sure that it is firmly attached to the stairs. Fix any carpet that is loose or worn.  Avoid having throw rugs at the top or bottom of the stairs. If you do have throw rugs, attach them to the floor with carpet tape.  Make sure that you have a light switch at the top of the stairs and the bottom of the stairs. If you do not have them, ask someone to add them for you. What else can I do to help prevent falls?  Wear shoes that:  Do not have high heels.  Have rubber bottoms.  Are comfortable and fit you well.  Are closed at the toe. Do not wear sandals.  If you use a stepladder:  Make sure that it is fully opened. Do not climb a closed stepladder.  Make sure that both  sides of the stepladder are locked into place.  Ask someone to hold it for you, if possible.  Clearly mark and make sure that you can see:  Any grab bars or handrails.  First and last steps.  Where the edge of each step is.  Use tools that help you move around (mobility aids) if they are needed. These include:  Canes.  Walkers.  Scooters.  Crutches.  Turn on the lights when you go into a dark area. Replace any light bulbs as soon as they burn out.  Set up your furniture so you have a clear path. Avoid moving your furniture around.  If any of your floors are uneven, fix them.  If there are any pets around you, be aware of where they are.  Review your medicines with your doctor. Some medicines can make you feel dizzy. This can increase your chance of falling. Ask your doctor what other things that you can do to help prevent falls. This information is not intended to replace advice given to you by your health care provider. Make sure you discuss any questions you have with your health care provider. Document Released: 10/25/2008 Document Revised: 06/06/2015 Document Reviewed: 02/02/2014 Elsevier Interactive Patient Education  2017 [employer].

## 2020-06-07 NOTE — Progress Notes (Signed)
I connected with Kirsten Riley today by telephone and verified that I am speaking with the correct person using two identifiers. Location patient: home Location provider: work Persons participating in the virtual visit: Tahesha, Skeet LPN.   I discussed the limitations, risks, security and privacy concerns of performing an evaluation and management service by telephone and the availability of in person appointments. I also discussed with the patient that there may be a patient responsible charge related to this service. The patient expressed understanding and verbally consented to this telephonic visit.    Interactive audio and video telecommunications were attempted between this provider and patient, however failed, due to patient having technical difficulties OR patient did not have access to video capability.  We continued and completed visit with audio only.     Vital signs may be patient reported or missing.  Subjective:   [name] is a 87 y.o. female who presents for Medicare Annual (Subsequent) preventive examination.  Review of Systems     Cardiac Risk Factors include: advanced age (>1men, >30 women);diabetes mellitus;dyslipidemia;sedentary lifestyle     Objective:    Today's Vitals   06/07/20 1026  Weight: 118 lb (53.5 kg)  Height: 5' (1.524 m)   Body mass index is 23.05 kg/m.  Advanced Directives 06/07/2020 06/16/2018 04/15/2016 01/09/2015  Does Patient Have a Medical Advance Directive? Yes Yes Yes No  Type of Advance Directive Out of facility DNR (pink MOST or yellow form) Living will;Healthcare Power of [employer] of [place];Living will -  Copy of Healthcare Power of Attorney in Chart? - No - copy requested No - copy requested -    Current Medications (verified) Outpatient Encounter Medications as of 06/07/2020  Medication Sig  . levothyroxine (SYNTHROID) 75 MCG tablet Take 1 tablet (75 mcg total) by mouth daily.  Marland Kitchen lovastatin (MEVACOR) 40 MG  tablet Take 1 tablet (40 mg total) by mouth at bedtime.  Marland Kitchen alendronate (FOSAMAX) 70 MG tablet Take 1 tablet (70 mg total) by mouth once a week. (Patient not taking: No sig reported)  . calcium-vitamin D 250-100 MG-UNIT per tablet Take 1 tablet by mouth 2 (two) times daily. (Patient not taking: No sig reported)  . folic acid (FOLVITE) 1 MG tablet Take by mouth. (Patient not taking: No sig reported)  . Multiple Vitamin (MULTIVITAMIN WITH MINERALS) TABS tablet Take 1 tablet by mouth daily. (Patient not taking: No sig reported)   No facility-administered encounter medications on file as of 06/07/2020.    Allergies (verified) Sulfa antibiotics and Penicillins   History: Past Medical History:  Diagnosis Date  . CAD (coronary artery disease)   . Diabetes mellitus (HCC) 12/10/2014  . Fractured rib   . Hyperlipidemia   . Hypertension   . Hypothyroidism   . Polymyalgia rheumatica (HCC) 06/08/2014   Past Surgical History:  Procedure Laterality Date  . ABDOMINAL HYSTERECTOMY  1978   partial  . CORONARY ARTERY BYPASS GRAFT    . hypercholesterol    . hypothyroid     Family History  Problem Relation Age of Onset  . Heart disease Mother   . Stroke Mother   . Hypertension Mother   . Cancer Father   . Cancer Sister        breast, ovarian, pancreatic  . Heart disease Sister        x2  . Cancer Brother   . Heart disease Brother   . Cancer Brother   . Heart disease Brother   . Cancer  Brother   . Heart disease Brother    Social History   Socioeconomic History  . Marital status: Widowed    Spouse name: Not on file  . Number of children: Not on file  . Years of education: Not on file  . Highest education level: 12th grade  Occupational History  . Occupation: retired  Tobacco Use  . Smoking status: Never Smoker  . Smokeless tobacco: Never Used  Vaping Use  . Vaping Use: Never used  Substance and Sexual Activity  . Alcohol use: No    Alcohol/week: 0.0 standard drinks  . Drug  use: No  . Sexual activity: Never  Other Topics Concern  . Not on file  Social History Narrative  . Not on file   Social Determinants of Health   Financial Resource Strain: Low Risk   . Difficulty of Paying Living Expenses: Not hard at all  Food Insecurity: No Food Insecurity  . Worried About Programme researcher, broadcasting/film/video in the Last Year: Never true  . Ran Out of Food in the Last Year: Never true  Transportation Needs: No Transportation Needs  . Lack of Transportation (Medical): No  . Lack of Transportation (Non-Medical): No  Physical Activity: Inactive  . Days of Exercise per Week: 0 days  . Minutes of Exercise per Session: 0 min  Stress: No Stress Concern Present  . Feeling of Stress : Not at all  Social Connections: Not on file    Tobacco Counseling Counseling given: Not Answered   Clinical Intake:  Pre-visit preparation completed: Yes  Pain : No/denies pain     Nutritional Status: BMI of 19-24  Normal Nutritional Risks: None Diabetes: Yes  How often do you need to have someone help you when you read instructions, pamphlets, or other written materials from your doctor or pharmacy?: 1 - Never What is the last grade level you completed in school?: 12th grade  Diabetic? Yes Nutrition Risk Assessment:  Has the patient had any N/V/D within the last 2 months?  No  Does the patient have any non-healing wounds?  No  Has the patient had any unintentional weight loss or weight gain?  No   Diabetes:  Is the patient diabetic?  Yes  If diabetic, was a CBG obtained today?  No  Did the patient bring in their glucometer from home?  No  How often do you monitor your CBG's? Does not.   Financial Strains and Diabetes Management:  Are you having any financial strains with the device, your supplies or your medication? No .  Does the patient want to be seen by Chronic Care Management for management of their diabetes?  No  Would the patient like to be referred to a Nutritionist or  for Diabetic Management?  No   Diabetic Exams:  Diabetic Eye Exam: Completed 03/15/2020 Diabetic Foot Exam: Completed 04/15/2020   Interpreter Needed?: No  Information entered by :: NAllen LPN   Activities of Daily Living In your present state of health, do you have any difficulty performing the following activities: 06/07/2020  Hearing? N  Vision? N  Difficulty concentrating or making decisions? N  Walking or climbing stairs? Y  Dressing or bathing? N  Doing errands, shopping? N  Preparing Food and eating ? N  Using the Toilet? N  In the past six months, have you accidently leaked urine? N  Do you have problems with loss of bowel control? N  Managing your Medications? N  Managing your Finances? N  Housekeeping  or managing your Housekeeping? N  Some recent data might be hidden    Patient Care Team: Marjie Skiff, NP as PCP - General (Nurse Practitioner) Kandyce Rud., MD (Rheumatology)  Indicate any recent Medical Services you may have received from other than [facility] providers in the past year (date may be approximate).     Assessment:   This is a routine wellness examination for Kirsten Riley.  Hearing/Vision screen  Hearing Screening   125Hz  250Hz  500Hz  1000Hz  2000Hz  3000Hz  4000Hz  6000Hz  8000Hz   Right ear:           Left ear:           Vision Screening Comments: Regular eye exams, Dr. , [facility]  Dietary issues and exercise activities discussed: Current Exercise Habits: The patient does not participate in regular exercise at present  Goals Addressed            This Visit's Progress   . Patient Stated       06/07/2020, no goals      Depression Screen PHQ 2/9 Scores 06/07/2020 10/16/2019 06/16/2018 04/15/2017 04/15/2016 10/18/2015 07/31/2014  PHQ - 2 Score 1 0 0 0 0 0 0  PHQ- 9 Score - - - - 0 - -    Fall Risk Fall Risk  06/07/2020 05/31/2019 06/16/2018 04/15/2017 04/15/2016  Falls in the past year? 0 0 0 No No  Number falls in past yr: - 0 0 - -   Injury with Fall? - 0 1 - -  Comment - - - - -  Risk for fall due to : Medication side effect - - - -  Follow up Falls evaluation completed;Education provided;Falls prevention discussed - - - -    FALL RISK PREVENTION PERTAINING TO THE HOME:  Any stairs in or around the home? Yes  If so, are there any without handrails? No  Home free of loose throw rugs in walkways, pet beds, electrical cords, etc? Yes  Adequate lighting in your home to reduce risk of falls? Yes   ASSISTIVE DEVICES UTILIZED TO PREVENT FALLS:  Life alert? Yes  Use of a cane, walker or w/c? No  Grab bars in the bathroom? Yes  Shower chair or bench in shower? Yes  Elevated toilet seat or a handicapped toilet? No   TIMED UP AND GO:  Was the test performed? No   Cognitive Function:     6CIT Screen 06/07/2020 06/16/2018 04/15/2017  What Year? 0 points 0 points 0 points  What month? 0 points 0 points 0 points  What time? 0 points 0 points 0 points  Count back from 20 0 points 0 points 0 points  Months in reverse 0 points 0 points 0 points  Repeat phrase 2 points 0 points 0 points  Total Score 2 0 0    Immunizations Immunization History  Administered Date(s) Administered  . Fluad Quad(high Dose 65+) 10/10/2018, 10/16/2019  . Influenza, High Dose Seasonal PF 10/18/2015, 10/16/2016, 10/19/2017  . Influenza,inj,Quad PF,6+ Mos 10/09/2014  . Pneumococcal Conjugate-13 10/09/2013  . Pneumococcal Polysaccharide-23 11/08/2005  . Td 09/22/2005  . Tdap 10/18/2015  . Unspecified SARS-COV-2 Vaccination 02/13/2019, 03/13/2019  . Zoster, Live 09/22/2005    TDAP status: Up to date  Flu Vaccine status: Up to date  Pneumococcal vaccine status: Up to date  Covid-19 vaccine status: Completed vaccines  Qualifies for Shingles Vaccine? Yes   Zostavax completed Yes   Shingrix Completed?: No.    Education has been provided regarding the  importance of this vaccine. Patient has been advised to call insurance company to  determine out of pocket expense if they have not yet received this vaccine. Advised may also receive vaccine at local pharmacy or Health Dept. Verbalized acceptance and understanding.  Screening Tests Health Maintenance  Topic Date Due  . Zoster Vaccines- Shingrix (1 of 2) Never done  . COVID-19 Vaccine (3 - Booster) 04/15/2021 (Originally 08/13/2019)  . INFLUENZA VACCINE  08/12/2020  . HEMOGLOBIN A1C  10/15/2020  . OPHTHALMOLOGY EXAM  03/15/2021  . FOOT EXAM  04/15/2021  . URINE MICROALBUMIN  04/15/2021  . TETANUS/TDAP  10/17/2025  . DEXA SCAN  Completed  . PNA vac Low Risk Adult  Completed  . HPV VACCINES  Aged Out    Health Maintenance  Health Maintenance Due  Topic Date Due  . Zoster Vaccines- Shingrix (1 of 2) Never done    Colorectal cancer screening: No longer required.   Mammogram status: No longer required due to age.  Bone Density status: Completed 03/05/2019.   Lung Cancer Screening: (Low Dose CT Chest recommended if Age 51-80 years, 30 pack-year currently smoking OR have quit w/in 15years.) does not qualify.   Lung Cancer Screening Referral: no  Additional Screening:  Hepatitis C Screening: does not qualify;  Vision Screening: Recommended annual ophthalmology exams for early detection of glaucoma and other disorders of the eye. Is the patient up to date with their annual eye exam?  Yes  Who is the provider or what is the name of the office in which the patient attends annual eye exams? Dr. Inez PilgrimBrasington If pt is not established with a provider, would they like to be referred to a provider to establish care? No .   Dental Screening: Recommended annual dental exams for proper oral hygiene  Community Resource Referral / Chronic Care Management: CRR required this visit?  No   CCM required this visit?  No      Plan:     I have personally reviewed and noted the following in the patient's chart:   . Medical and social history . Use of alcohol, tobacco or  illicit drugs  . Current medications and supplements including opioid prescriptions.  . Functional ability and status . Nutritional status . Physical activity . Advanced directives . List of other physicians . Hospitalizations, surgeries, and ER visits in previous 12 months . Vitals . Screenings to include cognitive, depression, and falls . Referrals and appointments  In addition, I have reviewed and discussed with patient certain preventive protocols, quality metrics, and best practice recommendations. A written personalized care plan for preventive services as well as general preventive health recommendations were provided to patient.     Barb Merinoickeah E Fonda Rochon, LPN   [id number]/27/2022   Nurse Notes:

## 2020-10-07 ENCOUNTER — Other Ambulatory Visit: Payer: Self-pay | Admitting: Nurse Practitioner

## 2020-10-07 NOTE — Telephone Encounter (Signed)
Requested medications are due for refill today.  yes  Requested medications are on the active medications list.  yes  Last refill. 10/17/2019  Has a scheduled appointment yes   Notes to clinic.  Labs are expired.

## 2020-10-15 ENCOUNTER — Ambulatory Visit: Payer: Medicare PPO | Admitting: Nurse Practitioner

## 2020-10-15 ENCOUNTER — Encounter: Payer: Self-pay | Admitting: Nurse Practitioner

## 2020-10-15 ENCOUNTER — Other Ambulatory Visit: Payer: Self-pay

## 2020-10-15 VITALS — BP 120/64 | HR 55 | Temp 97.7°F | Ht 58.5 in | Wt 113.0 lb

## 2020-10-15 DIAGNOSIS — Z23 Encounter for immunization: Secondary | ICD-10-CM

## 2020-10-15 DIAGNOSIS — E119 Type 2 diabetes mellitus without complications: Secondary | ICD-10-CM

## 2020-10-15 DIAGNOSIS — E1169 Type 2 diabetes mellitus with other specified complication: Secondary | ICD-10-CM

## 2020-10-15 DIAGNOSIS — M0609 Rheumatoid arthritis without rheumatoid factor, multiple sites: Secondary | ICD-10-CM | POA: Diagnosis not present

## 2020-10-15 DIAGNOSIS — E038 Other specified hypothyroidism: Secondary | ICD-10-CM

## 2020-10-15 DIAGNOSIS — N1831 Chronic kidney disease, stage 3a: Secondary | ICD-10-CM

## 2020-10-15 DIAGNOSIS — M810 Age-related osteoporosis without current pathological fracture: Secondary | ICD-10-CM

## 2020-10-15 DIAGNOSIS — M353 Polymyalgia rheumatica: Secondary | ICD-10-CM | POA: Diagnosis not present

## 2020-10-15 DIAGNOSIS — E785 Hyperlipidemia, unspecified: Secondary | ICD-10-CM

## 2020-10-15 LAB — BAYER DCA HB A1C WAIVED: HB A1C (BAYER DCA - WAIVED): 7.3 % — ABNORMAL HIGH (ref 4.8–5.6)

## 2020-10-15 NOTE — Assessment & Plan Note (Signed)
Previously monitored by rheumatology with last visit in February 2021, symptoms have improved and she is to return as needed only.   

## 2020-10-15 NOTE — Assessment & Plan Note (Signed)
Chronic, stable.  No further medications.  Is to return to rheumatology as needed.  At this time is asymptomatic. 

## 2020-10-15 NOTE — Assessment & Plan Note (Signed)
Chronic, ongoing.  Continue current medication regimen and adjust as needed.  Can consider discontinuation of this if patient wishes to minimize medications in future.  Lipid panel today. 

## 2020-10-15 NOTE — Assessment & Plan Note (Signed)
Chronic, stable, diet-controlled.  A1C today 7.3%. Urine micro 10 and A:C <30 tin March 2022.   Recommend continued focus on diet.  Due to advanced age more lenient goal recommended, A1C <8.  Avoid hypoglycemia.  Return in 6 months.

## 2020-10-15 NOTE — Patient Instructions (Signed)
Diabetes Mellitus and Nutrition, Adult When you have diabetes, or diabetes mellitus, it is very important to have healthy eating habits because your blood sugar (glucose) levels are greatly affected by what you eat and drink. Eating healthy foods in the right amounts, at about the same times every day, can help you:  Control your blood glucose.  Lower your risk of heart disease.  Improve your blood pressure.  Reach or maintain a healthy weight. What can affect my meal plan? Every person with diabetes is different, and each person has different needs for a meal plan. Your health care provider may recommend that you work with a dietitian to make a meal plan that is best for you. Your meal plan may vary depending on factors such as:  The calories you need.  The medicines you take.  Your weight.  Your blood glucose, blood pressure, and cholesterol levels.  Your activity level.  Other health conditions you have, such as heart or kidney disease. How do carbohydrates affect me? Carbohydrates, also called carbs, affect your blood glucose level more than any other type of food. Eating carbs naturally raises the amount of glucose in your blood. Carb counting is a method for keeping track of how many carbs you eat. Counting carbs is important to keep your blood glucose at a healthy level, especially if you use insulin or take certain oral diabetes medicines. It is important to know how many carbs you can safely have in each meal. This is different for every person. Your dietitian can help you calculate how many carbs you should have at each meal and for each snack. How does alcohol affect me? Alcohol can cause a sudden decrease in blood glucose (hypoglycemia), especially if you use insulin or take certain oral diabetes medicines. Hypoglycemia can be a life-threatening condition. Symptoms of hypoglycemia, such as sleepiness, dizziness, and confusion, are similar to symptoms of having too much  alcohol.  Do not drink alcohol if: ? Your health care provider tells you not to drink. ? You are pregnant, may be pregnant, or are planning to become pregnant.  If you drink alcohol: ? Do not drink on an empty stomach. ? Limit how much you use to:  0-1 drink a day for women.  0-2 drinks a day for men. ? Be aware of how much alcohol is in your drink. In the [place] one drink equals one 12 oz bottle of beer (355 mL), one 5 oz glass of wine (148 mL), or one 1 oz glass of hard liquor (44 mL). ? Keep yourself hydrated with water, diet soda, or unsweetened iced tea.  Keep in mind that regular soda, juice, and other mixers may contain a lot of sugar and must be counted as carbs. What are tips for following this plan? Reading food labels  Start by checking the serving size on the "Nutrition Facts" label of packaged foods and drinks. The amount of calories, carbs, fats, and other nutrients listed on the label is based on one serving of the item. Many items contain more than one serving per package.  Check the total grams (g) of carbs in one serving. You can calculate the number of servings of carbs in one serving by dividing the total carbs by 15. For example, if a food has 30 g of total carbs per serving, it would be equal to 2 servings of carbs.  Check the number of grams (g) of saturated fats and trans fats in one serving. Choose foods that have   a low amount or none of these fats.  Check the number of milligrams (mg) of salt (sodium) in one serving. Most people should limit total sodium intake to less than 2,300 mg per day.  Always check the nutrition information of foods labeled as "low-fat" or "nonfat." These foods may be higher in added sugar or refined carbs and should be avoided.  Talk to your dietitian to identify your daily goals for nutrients listed on the label. Shopping  Avoid buying canned, pre-made, or processed foods. These foods tend to be high in fat, sodium, and added  sugar.  Shop around the outside edge of the grocery store. This is where you will most often find fresh fruits and vegetables, bulk grains, fresh meats, and fresh dairy. Cooking  Use low-heat cooking methods, such as baking, instead of high-heat cooking methods like deep frying.  Cook using healthy oils, such as olive, canola, or sunflower oil.  Avoid cooking with butter, cream, or high-fat meats. Meal planning  Eat meals and snacks regularly, preferably at the same times every day. Avoid going long periods of time without eating.  Eat foods that are high in fiber, such as fresh fruits, vegetables, beans, and whole grains. Talk with your dietitian about how many servings of carbs you can eat at each meal.  Eat 4-6 oz (112-168 g) of lean protein each day, such as lean meat, chicken, fish, eggs, or tofu. One ounce (oz) of lean protein is equal to: ? 1 oz (28 g) of meat, chicken, or fish. ? 1 egg. ?  cup (62 g) of tofu.  Eat some foods each day that contain healthy fats, such as avocado, nuts, seeds, and fish.   What foods should I eat? Fruits Berries. Apples. Oranges. Peaches. Apricots. Plums. Grapes. Mango. Papaya. Pomegranate. Kiwi. Cherries. Vegetables Lettuce. Spinach. Leafy greens, including kale, chard, collard greens, and mustard greens. Beets. Cauliflower. Cabbage. Broccoli. Carrots. Green beans. Tomatoes. Peppers. Onions. Cucumbers. Brussels sprouts. Grains Whole grains, such as whole-wheat or whole-grain bread, crackers, tortillas, cereal, and pasta. Unsweetened oatmeal. Quinoa. Brown or wild rice. Meats and other proteins Seafood. Poultry without skin. Lean cuts of poultry and beef. Tofu. Nuts. Seeds. Dairy Low-fat or fat-free dairy products such as milk, yogurt, and cheese. The items listed above may not be a complete list of foods and beverages you can eat. Contact a dietitian for more information. What foods should I avoid? Fruits Fruits canned with  syrup. Vegetables Canned vegetables. Frozen vegetables with butter or cream sauce. Grains Refined white flour and flour products such as bread, pasta, snack foods, and cereals. Avoid all processed foods. Meats and other proteins Fatty cuts of meat. Poultry with skin. Breaded or fried meats. Processed meat. Avoid saturated fats. Dairy Full-fat yogurt, cheese, or milk. Beverages Sweetened drinks, such as soda or iced tea. The items listed above may not be a complete list of foods and beverages you should avoid. Contact a dietitian for more information. Questions to ask a health care provider  Do I need to meet with a diabetes educator?  Do I need to meet with a dietitian?  What number can I call if I have questions?  When are the best times to check my blood glucose? Where to find more information:  American Diabetes Association: diabetes.org  Academy of Nutrition and Dietetics: www.eatright.org  National Institute of Diabetes and Digestive and Kidney Diseases: www.niddk.nih.gov  Association of Diabetes Care and Education Specialists: www.diabeteseducator.org Summary  It is important to have healthy eating   habits because your blood sugar (glucose) levels are greatly affected by what you eat and drink.  A healthy meal plan will help you control your blood glucose and maintain a healthy lifestyle.  Your health care provider may recommend that you work with a dietitian to make a meal plan that is best for you.  Keep in mind that carbohydrates (carbs) and alcohol have immediate effects on your blood glucose levels. It is important to count carbs and to use alcohol carefully. This information is not intended to replace advice given to you by your health care provider. Make sure you discuss any questions you have with your health care provider. Document Revised: 12/06/2018 Document Reviewed: 12/06/2018 Elsevier Patient Education  2021 [employer]  

## 2020-10-15 NOTE — Assessment & Plan Note (Signed)
Chronic, ongoing.  Fosamax stopped last visit for holiday and will continue off due to recent jaw and teeth issues with treatment.  Plan for repeat DEXA February 2023.

## 2020-10-15 NOTE — Progress Notes (Signed)
BP 120/64 (BP Location: Left Arm, Patient Position: Sitting, Cuff Size: Normal)   Pulse (!) 55   Temp 97.7 F (36.5 C)   Ht 4' 10.5" (1.486 m)   Wt 113 lb (51.3 kg)   LMP  (LMP Unknown)   SpO2 98%   BMI 23.22 kg/m    Subjective:    Patient ID: Kirsten Riley, female    DOB: Jul 14, 1931, 87 y.o.   MRN: [id number]  HPI: Kirsten Riley is a 87 y.o. female  Chief Complaint  Patient presents with   Follow-up    Pt wants to talk about her teeth.   DIABETES No current medications, continues on diet control with recent A1C in April 7.3%.   Hypoglycemic episodes:no Polydipsia/polyuria: no Visual disturbance: no Chest pain: no Paresthesias: no Glucose Monitoring: no             Accucheck frequency: Not Checking             Fasting glucose:             Post prandial:             Evening:             Before meals: Taking Insulin?: no             Long acting insulin:             Short acting insulin: Blood Pressure Monitoring: not checking Retinal Examination: Up to Date -- Dr. Celesta Gentile Exam: Up to Date Pneumovax: Up to date Influenza: Up to Date Aspirin: no    HYPOTHYROIDISM Continues on Levothyroxine 50 MCG daily.  Last  TSH 0.287 -- she does not want any medication changes, has tried lower doses and this affects her heart she reports.   Satisfied with current treatment? yes Medication side effects: no Medication compliance: good compliance Etiology of hypothyroidism:  Recent dose adjustment: none Fatigue: none Cold intolerance: no Heat intolerance: no Weight gain: no Weight loss: no Constipation: no Diarrhea/loose stools: no Palpitations: no Lower extremity edema: no Anxiety/depressed mood: no    HYPERLIPIDEMIA Continues on Lovastatin 40 MG daily. Hyperlipidemia status: good compliance Satisfied with current treatment?  no Side effects:  no Medication compliance: good compliance Supplements: none Aspirin:  no The ASCVD Risk score (Arnett DK, et al.,  2019) failed to calculate for the following reasons:   The 2019 ASCVD risk score is only valid for ages 30 to 85   OSTEOPOROSIS & POLYMYALGIA RHEUMATICA: Last DEXA 03/06/2019 with T score -3.1.  She was followed by Dr. Jefm Bryant at [facility] for RA and osteoporosis, last visit in February 2021.  Dr. Jefm Bryant told her she does not need to come back unless needed, this is noted on review of note as well.   Continues on Vitamin D, Fosamax discontinued due to jaw and dentition issues.  Continues to see specialist at [facility] for this, reports they are doing no further procedures at this time.   Does strength exercises at home for fall prevention.  No recent falls.    Relevant past medical, surgical, family and social history reviewed and updated as indicated. Interim medical history since our last visit reviewed. Allergies and medications reviewed and updated.  Review of Systems  Constitutional:  Negative for activity change, appetite change, diaphoresis, fatigue and fever.  Respiratory:  Negative for cough, chest tightness and shortness of breath.   Cardiovascular:  Negative for chest pain, palpitations and leg swelling.  Gastrointestinal: Negative.  Endocrine: Negative.   Neurological: Negative.   Psychiatric/Behavioral: Negative.    All other systems reviewed and are negative.  Per HPI unless specifically indicated above     Objective:    BP 120/64 (BP Location: Left Arm, Patient Position: Sitting, Cuff Size: Normal)   Pulse (!) 55   Temp 97.7 F (36.5 C)   Ht 4' 10.5" (1.486 m)   Wt 113 lb (51.3 kg)   LMP  (LMP Unknown)   SpO2 98%   BMI 23.22 kg/m   Wt Readings from Last 3 Encounters:  10/15/20 113 lb (51.3 kg)  06/07/20 118 lb (53.5 kg)  04/15/20 117 lb 9.6 oz (53.3 kg)    Physical Exam Vitals and nursing note reviewed.  Constitutional:      General: She is awake. She is not in acute distress.    Appearance: She is well-developed. She is not ill-appearing.  HENT:     Head:  Normocephalic.     Right Ear: Hearing normal.     Left Ear: Hearing normal.  Eyes:     General: Lids are normal.        Right eye: No discharge.        Left eye: No discharge.     Conjunctiva/sclera: Conjunctivae normal.     Pupils: Pupils are equal, round, and reactive to light.  Neck:     Thyroid: No thyromegaly.     Vascular: No carotid bruit.  Cardiovascular:     Rate and Rhythm: Normal rate and regular rhythm.     Heart sounds: Normal heart sounds. No murmur heard.   No gallop.  Pulmonary:     Effort: Pulmonary effort is normal. No accessory muscle usage or respiratory distress.     Breath sounds: Normal breath sounds.  Abdominal:     General: Bowel sounds are normal.     Palpations: Abdomen is soft.  Musculoskeletal:     Cervical back: Normal range of motion and neck supple.     Right lower leg: No edema.     Left lower leg: No edema.  Skin:    General: Skin is warm and dry.  Neurological:     Mental Status: She is alert and oriented to person, place, and time.  Psychiatric:        Attention and Perception: Attention normal.        Mood and Affect: Mood normal.        Behavior: Behavior normal. Behavior is cooperative.        Thought Content: Thought content normal.        Judgment: Judgment normal.    Results for orders placed or performed in visit on 04/15/20  T4, free  Result Value Ref Range   Free T4 1.85 (H) 0.82 - 1.77 ng/dL  TSH  Result Value Ref Range   TSH 0.287 (L) 0.450 - 4.500 uIU/mL  Comprehensive metabolic panel  Result Value Ref Range   Glucose 155 (H) 65 - 99 mg/dL   BUN 12 8 - 27 mg/dL   Creatinine, Ser 0.87 0.57 - 1.00 mg/dL   eGFR 65 >59 mL/min/1.73   BUN/Creatinine Ratio 14 12 - 28   Sodium 139 134 - 144 mmol/L   Potassium 4.0 3.5 - 5.2 mmol/L   Chloride 101 96 - 106 mmol/L   CO2 18 (L) 20 - 29 mmol/L   Calcium 9.5 8.7 - 10.3 mg/dL   Total Protein 7.1 6.0 - 8.5 g/dL   Albumin 4.4 3.6 - 4.6  g/dL   Globulin, Total 2.7 1.5 - 4.5 g/dL    Albumin/Globulin Ratio 1.6 1.2 - 2.2   Bilirubin Total 0.9 0.0 - 1.2 mg/dL   Alkaline Phosphatase 138 (H) 44 - 121 IU/L   AST 23 0 - 40 IU/L   ALT 14 0 - 32 IU/L  Lipid Panel w/o Chol/HDL Ratio  Result Value Ref Range   Cholesterol, Total 131 100 - 199 mg/dL   Triglycerides 123 0 - 149 mg/dL   HDL 44 >39 mg/dL   VLDL Cholesterol Cal 22 5 - 40 mg/dL   LDL Chol Calc (NIH) 65 0 - 99 mg/dL  VITAMIN D 25 Hydroxy (Vit-D Deficiency, Fractures)  Result Value Ref Range   Vit D, 25-Hydroxy 31.2 30.0 - 100.0 ng/mL  Bayer DCA Hb A1c Waived  Result Value Ref Range   HB A1C (BAYER DCA - WAIVED) 7.3 (H) <7.0 %  Microalbumin, Urine Waived  Result Value Ref Range   Microalb, Ur Waived 10 0 - 19 mg/L   Creatinine, Urine Waived 10 10 - 300 mg/dL   Microalb/Creat Ratio <30 <30 mg/g      Assessment & Plan:   Problem List Items Addressed This Visit       Endocrine   Hyperlipidemia associated with type 2 diabetes mellitus (HCC)    Chronic, ongoing.  Continue current medication regimen and adjust as needed.  Can consider discontinuation of this if patient wishes to minimize medications in future.  Lipid panel today.      Relevant Orders   Bayer DCA Hb A1c Waived   Lipid Panel w/o Chol/HDL Ratio   Hypothyroidism    Chronic, ongoing.  Continue current medication regimen and adjust as needed.  TSH and Free T4 today.  Refills as needed.      Relevant Orders   TSH   T4, free   Type 2 diabetes, diet controlled ([place]) - Primary    Chronic, stable, diet-controlled.  A1C today 7.3%. Urine micro 10 and A:C <30 tin March 2022.   Recommend continued focus on diet.  Due to advanced age more lenient goal recommended, A1C <8.  Avoid hypoglycemia.  Return in 6 months.      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  No further medications.  Is to  return to rheumatology as needed.  At this time is asymptomatic.      Osteoporosis    Chronic, ongoing.  Fosamax stopped last visit for holiday and will continue off due to recent jaw and teeth issues with treatment.  Plan for repeat DEXA February 2023.        Other   PMR (polymyalgia rheumatica) (HCC)    Previously monitored by rheumatology with last visit in February 2021, symptoms have improved and she is to return as needed only.        Other Visit Diagnoses     Flu vaccine need       Flu vaccine today.   Relevant Orders   Flu Vaccine QUAD High Dose(Fluad)        Follow up plan: Return in about 6 months (around 04/15/2021) for T2DM, HTN/HLD, OSTEOPOROSIS.

## 2020-10-15 NOTE — Assessment & Plan Note (Signed)
Chronic, ongoing.  Continue current medication regimen and adjust as needed.  TSH and Free T4 today.  Refills as needed. 

## 2020-10-16 LAB — BASIC METABOLIC PANEL
BUN/Creatinine Ratio: 13 (ref 12–28)
BUN: 10 mg/dL (ref 8–27)
CO2: 19 mmol/L — ABNORMAL LOW (ref 20–29)
Calcium: 9.4 mg/dL (ref 8.7–10.3)
Chloride: 103 mmol/L (ref 96–106)
Creatinine, Ser: 0.76 mg/dL (ref 0.57–1.00)
Glucose: 117 mg/dL — ABNORMAL HIGH (ref 70–99)
Potassium: 4.2 mmol/L (ref 3.5–5.2)
Sodium: 143 mmol/L (ref 134–144)
eGFR: 76 mL/min/{1.73_m2} (ref >59)

## 2020-10-16 LAB — LIPID PANEL W/O CHOL/HDL RATIO
Cholesterol, Total: 124 mg/dL (ref 100–199)
HDL: 43 mg/dL (ref >39)
LDL Chol Calc (NIH): 62 mg/dL (ref 0–99)
Triglycerides: 101 mg/dL (ref 0–149)
VLDL Cholesterol Cal: 19 mg/dL (ref 5–40)

## 2020-10-16 LAB — TSH: TSH: 0.461 u[IU]/mL (ref 0.450–4.500)

## 2020-10-16 LAB — T4, FREE: Free T4: 1.8 ng/dL — ABNORMAL HIGH (ref 0.82–1.77)

## 2020-10-16 NOTE — Progress Notes (Signed)
Contacted via MyChart   Good morning Kirsten Riley, all of your labs remain stable and you can continue all current medications.  No changes needed.  Have a wonderful day!! Keep being amazing!!  Thank you for allowing me to participate in your care.  I appreciate you. Kindest regards, Sie Formisano

## 2020-11-13 ENCOUNTER — Other Ambulatory Visit: Payer: Self-pay | Admitting: Nurse Practitioner

## 2020-11-13 NOTE — Telephone Encounter (Signed)
Per OV/lab- continue all medications Requested Prescriptions  Pending Prescriptions Disp Refills  . lovastatin (MEVACOR) 40 MG tablet [Pharmacy Med Name: LOVASTATIN 40 MG TAB] 90 tablet 4    Sig: TAKE 1 TABLET BY MOUTH EVERY NIGHT AT BEDTIME     Cardiovascular:  Antilipid - Statins Passed - 11/13/2020 10:39 AM      Passed - Total Cholesterol in normal range and within 360 days    Cholesterol, Total  Date Value Ref Range Status  10/15/2020 124 100 - 199 mg/dL Final   Cholesterol Piccolo, Waived  Date Value Ref Range Status  06/27/2018 121 <200 mg/dL Final    Comment:                            Desirable                <200                         Borderline High      200- 239                         High                     >239          Passed - LDL in normal range and within 360 days    LDL Chol Calc (NIH)  Date Value Ref Range Status  10/15/2020 62 0 - 99 mg/dL Final         Passed - HDL in normal range and within 360 days    HDL  Date Value Ref Range Status  10/15/2020 43 >39 mg/dL Final         Passed - Triglycerides in normal range and within 360 days    Triglycerides  Date Value Ref Range Status  10/15/2020 101 0 - 149 mg/dL Final   Triglycerides Piccolo,Waived  Date Value Ref Range Status  06/27/2018 80 <150 mg/dL Final    Comment:                            Normal                   <150                         Borderline High     150 - 199                         High                200 - 499                         Very High                >499          Passed - Patient is not pregnant      Passed - Valid encounter within last 12 months    Recent Outpatient Visits          4 weeks ago Type 2 diabetes, diet controlled (HCC)   [facility] [place], Jolene T, NP   7 months ago Type 2 diabetes, diet  controlled (HCC)   [facility] [place], Boissevain T, NP   1 year ago Type 2 diabetes, diet controlled (HCC)   [facility] [place], Presid[place] T, NP   1 year ago Diabetes mellitus without complication (HCC)   [facility] [place], Jolene T, NP   2 years ago Diabetes mellitus without complication (HCC)   [facility] [place], Dorie Rank, NP      Future Appointments            In 5 months Cann[place], Dorie Rank, NP [facility]   In 7 months  [facility]

## 2021-03-18 DIAGNOSIS — H35031 Hypertensive retinopathy, right eye: Secondary | ICD-10-CM | POA: Diagnosis not present

## 2021-04-12 NOTE — Patient Instructions (Addendum)
Start taking Vitamin D3 2000 units daily. ? ?Diabetes Mellitus and Nutrition, Adult ?When you have diabetes, or diabetes mellitus, it is very important to have healthy eating habits because your blood sugar (glucose) levels are greatly affected by what you eat and drink. Eating healthy foods in the right amounts, at about the same times every day, can help you: ?Manage your blood glucose. ?Lower your risk of heart disease. ?Improve your blood pressure. ?Reach or maintain a healthy weight. ?What can affect my meal plan? ?Every person with diabetes is different, and each person has different needs for a meal plan. Your health care provider may recommend that you work with a dietitian to make a meal plan that is best for you. Your meal plan may vary depending on factors such as: ?The calories you need. ?The medicines you take. ?Your weight. ?Your blood glucose, blood pressure, and cholesterol levels. ?Your activity level. ?Other health conditions you have, such as heart or kidney disease. ?How do carbohydrates affect me? ?Carbohydrates, also called carbs, affect your blood glucose level more than any other type of food. Eating carbs raises the amount of glucose in your blood. ?It is important to know how many carbs you can safely have in each meal. This is different for every person. Your dietitian can help you calculate how many carbs you should have at each meal and for each snack. ?How does alcohol affect me? ?Alcohol can cause a decrease in blood glucose (hypoglycemia), especially if you use insulin or take certain diabetes medicines by mouth. Hypoglycemia can be a life-threatening condition. Symptoms of hypoglycemia, such as sleepiness, dizziness, and confusion, are similar to symptoms of having too much alcohol. ?Do not drink alcohol if: ?Your health care provider tells you not to drink. ?You are pregnant, may be pregnant, or are planning to become pregnant. ?If you drink alcohol: ?Limit how much you have  to: ?0-1 drink a day for women. ?0-2 drinks a day for men. ?Know how much alcohol is in your drink. In the [place] one drink equals one 12 oz bottle of beer (355 mL), one 5 oz glass of wine (148 mL), or one 1? oz glass of hard liquor (44 mL). ?Keep yourself hydrated with water, diet soda, or unsweetened iced tea. Keep in mind that regular soda, juice, and other mixers may contain a lot of sugar and must be counted as carbs. ?What are tips for following this plan? ?Reading food labels ?Start by checking the serving size on the Nutrition Facts label of packaged foods and drinks. The number of calories and the amount of carbs, fats, and other nutrients listed on the label are based on one serving of the item. Many items contain more than one serving per package. ?Check the total grams (g) of carbs in one serving. ?Check the number of grams of saturated fats and trans fats in one serving. Choose foods that have a low amount or none of these fats. ?Check the number of milligrams (mg) of salt (sodium) in one serving. Most people should limit total sodium intake to less than 2,300 mg per day. ?Always check the nutrition information of foods labeled as "low-fat" or "nonfat." These foods may be higher in added sugar or refined carbs and should be avoided. ?Talk to your dietitian to identify your daily goals for nutrients listed on the label. ?Shopping ?Avoid buying canned, pre-made, or processed foods. These foods tend to be high in fat, sodium, and added sugar. ?Shop around the outside edge of  the grocery store. This is where you will most often find fresh fruits and vegetables, bulk grains, fresh meats, and fresh dairy products. ?Cooking ?Use low-heat cooking methods, such as baking, instead of high-heat cooking methods, such as deep frying. ?Cook using healthy oils, such as olive, canola, or sunflower oil. ?Avoid cooking with butter, cream, or high-fat meats. ?Meal planning ?Eat meals and snacks regularly, preferably at the  same times every day. Avoid going long periods of time without eating. ?Eat foods that are high in fiber, such as fresh fruits, vegetables, beans, and whole grains. ?Eat 4-6 oz (112-168 g) of lean protein each day, such as lean meat, chicken, fish, eggs, or tofu. One ounce (oz) (28 g) of lean protein is equal to: ?1 oz (28 g) of meat, chicken, or fish. ?1 egg. ?? cup (62 g) of tofu. ?Eat some foods each day that contain healthy fats, such as avocado, nuts, seeds, and fish. ?What foods should I eat? ?Fruits ?Berries. Apples. Oranges. Peaches. Apricots. Plums. Grapes. Mangoes. Papayas. Pomegranates. Kiwi. Cherries. ?Vegetables ?Leafy greens, including lettuce, spinach, kale, chard, collard greens, mustard greens, and cabbage. Beets. Cauliflower. Broccoli. Carrots. Green beans. Tomatoes. Peppers. Onions. Cucumbers. Brussels sprouts. ?Grains ?Whole grains, such as whole-wheat or whole-grain bread, crackers, tortillas, cereal, and pasta. Unsweetened oatmeal. Quinoa. Brown or wild rice. ?Meats and other proteins ?Seafood. Poultry without skin. Lean cuts of poultry and beef. Tofu. Nuts. Seeds. ?Dairy ?Low-fat or fat-free dairy products such as milk, yogurt, and cheese. ?The items listed above may not be a complete list of foods and beverages you can eat and drink. Contact a dietitian for more information. ?What foods should I avoid? ?Fruits ?Fruits canned with syrup. ?Vegetables ?Canned vegetables. Frozen vegetables with butter or cream sauce. ?Grains ?Refined white flour and flour products such as bread, pasta, snack foods, and cereals. Avoid all processed foods. ?Meats and other proteins ?Fatty cuts of meat. Poultry with skin. Breaded or fried meats. Processed meat. Avoid saturated fats. ?Dairy ?Full-fat yogurt, cheese, or milk. ?Beverages ?Sweetened drinks, such as soda or iced tea. ?The items listed above may not be a complete list of foods and beverages you should avoid. Contact a dietitian for more  information. ?Questions to ask a health care provider ?Do I need to meet with a certified diabetes care and education specialist? ?Do I need to meet with a dietitian? ?What number can I call if I have questions? ?When are the best times to check my blood glucose? ?Where to find more information: ?American Diabetes Association: diabetes.org ?Academy of Nutrition and Dietetics: eatright.org ?[employer] of Diabetes and Digestive and Kidney Diseases: AmenCredit.is ?Association of Diabetes Care & Education Specialists: diabeteseducator.org ?Summary ?It is important to have healthy eating habits because your blood sugar (glucose) levels are greatly affected by what you eat and drink. It is important to use alcohol carefully. ?A healthy meal plan will help you manage your blood glucose and lower your risk of heart disease. ?Your health care provider may recommend that you work with a dietitian to make a meal plan that is best for you. ?This information is not intended to replace advice given to you by your health care provider. Make sure you discuss any questions you have with your health care provider. ?Document Revised: 08/02/2019 Document Reviewed: 08/02/2019 ?Elsevier Patient Education ? [place]. ? ?

## 2021-04-15 ENCOUNTER — Encounter: Payer: Self-pay | Admitting: Nurse Practitioner

## 2021-04-15 ENCOUNTER — Ambulatory Visit: Payer: Medicare PPO | Admitting: Nurse Practitioner

## 2021-04-15 VITALS — BP 124/70 | HR 57 | Temp 97.8°F | Wt 114.4 lb

## 2021-04-15 DIAGNOSIS — E038 Other specified hypothyroidism: Secondary | ICD-10-CM | POA: Diagnosis not present

## 2021-04-15 DIAGNOSIS — E785 Hyperlipidemia, unspecified: Secondary | ICD-10-CM

## 2021-04-15 DIAGNOSIS — M353 Polymyalgia rheumatica: Secondary | ICD-10-CM | POA: Diagnosis not present

## 2021-04-15 DIAGNOSIS — M810 Age-related osteoporosis without current pathological fracture: Secondary | ICD-10-CM | POA: Diagnosis not present

## 2021-04-15 DIAGNOSIS — M0609 Rheumatoid arthritis without rheumatoid factor, multiple sites: Secondary | ICD-10-CM | POA: Diagnosis not present

## 2021-04-15 DIAGNOSIS — E119 Type 2 diabetes mellitus without complications: Secondary | ICD-10-CM | POA: Diagnosis not present

## 2021-04-15 DIAGNOSIS — E1169 Type 2 diabetes mellitus with other specified complication: Secondary | ICD-10-CM | POA: Diagnosis not present

## 2021-04-15 LAB — BAYER DCA HB A1C WAIVED: HB A1C (BAYER DCA - WAIVED): 7.4 % — ABNORMAL HIGH (ref 4.8–5.6)

## 2021-04-15 LAB — MICROALBUMIN, URINE WAIVED
Creatinine, Urine Waived: 200 mg/dL (ref 10–300)
Microalb, Ur Waived: 30 mg/L — ABNORMAL HIGH (ref 0–19)
Microalb/Creat Ratio: <30 mg/g (ref <30)

## 2021-04-15 NOTE — Progress Notes (Addendum)
? ?BP 124/70   Pulse (!) 57   Temp 97.8 ?F (36.6 ?C) (Oral)   Wt 114 lb 6.4 oz (51.9 kg)   LMP  (LMP Unknown)   SpO2 98%   BMI 23.50 kg/m?   ? ?Subjective:  ? ? Patient ID: Kirsten Riley, female    DOB: October 18, 1931, 88 y.o.   MRN: [id number] ? ?HPI: ?Kirsten Riley is a 88 y.o. female ? ?Chief Complaint  ?Patient presents with  ? Diabetes  ?  Patient recent Diabetic Eye Exam requested at today's visit.   ? Hyperlipidemia  ? Hypertension  ? Osteoporosis  ? ?DIABETES ?No current medications, continues on diet control with recent A1c in October 7.3%.   ?Hypoglycemic episodes:no ?Polydipsia/polyuria: no ?Visual disturbance: no ?Chest pain: no ?Paresthesias: no ?Glucose Monitoring: no ?            Accucheck frequency: Not Checking ?            Fasting glucose: ?            Post prandial: ?            Evening: ?            Before meals: ?Taking Insulin?: no ?            Long acting insulin: ?            Short acting insulin: ?Blood Pressure Monitoring: not checking ?Retinal Examination: Up to Date -- Dr. Inez Pilgrim in January ?Foot Exam: Up to Date ?Pneumovax: Up to date ?Influenza: Up to Date ?Aspirin: no  ?  ?HYPOTHYROIDISM ?Continues on Levothyroxine 50 MCG daily.  October 2022 thyroid labs remain stable. ?Satisfied with current treatment? yes ?Medication side effects: no ?Medication compliance: good compliance ?Etiology of hypothyroidism:  ?Recent dose adjustment: none ?Fatigue: none ?Cold intolerance: no ?Heat intolerance: no ?Weight gain: no ?Weight loss: no ?Constipation: no ?Diarrhea/loose stools: no ?Palpitations: no ?Lower extremity edema: no ?Anxiety/depressed mood: no  ?  ?HYPERLIPIDEMIA ?Continues on Lovastatin 40 MG daily. ?Hyperlipidemia status: good compliance ?Satisfied with current treatment?  no ?Side effects:  no ?Medication compliance: good compliance ?Supplements: none ?Aspirin:  no ?The ASCVD Risk score (Arnett DK, et al., 2019) failed to calculate for the following reasons: ?  The 2019 ASCVD risk  score is only valid for ages 53 to 39 ? ? ?OSTEOPOROSIS & POLYMYALGIA RHEUMATICA: ?Last DEXA 03/06/2019 with T score -3.1.  Was followed by Dr. Gavin Potters at [facility] for RA and osteoporosis, last visit in February 2021.   ? ?Dr. Gavin Potters told her she does not need to come back unless needed, this is noted on review of note as well.  Fosamax discontinued due to jaw and dentition issues.  Was taking Vitamin D, but stopped this months back.  Saw specialist at [facility] for jaw issues, reports they are doing no further procedures at this time.  Does strength exercises at home for fall prevention.  No recent falls.   ? ?Relevant past medical, surgical, family and social history reviewed and updated as indicated. Interim medical history since our last visit reviewed. ?Allergies and medications reviewed and updated. ? ?Review of Systems  ?Constitutional:  Negative for activity change, appetite change, diaphoresis, fatigue and fever.  ?Respiratory:  Negative for cough, chest tightness and shortness of breath.   ?Cardiovascular:  Negative for chest pain, palpitations and leg swelling.  ?Gastrointestinal: Negative.   ?Endocrine: Negative.   ?Neurological: Negative.   ?Psychiatric/Behavioral: Negative.    ?All other  systems reviewed and are negative. ? ?Per HPI unless specifically indicated above ? ?   ?Objective:  ?  ?BP 124/70   Pulse (!) 57   Temp 97.8 ?F (36.6 ?C) (Oral)   Wt 114 lb 6.4 oz (51.9 kg)   LMP  (LMP Unknown)   SpO2 98%   BMI 23.50 kg/m?   ?Wt Readings from Last 3 Encounters:  ?04/15/21 114 lb 6.4 oz (51.9 kg)  ?10/15/20 113 lb (51.3 kg)  ?06/07/20 118 lb (53.5 kg)  ?  ?Physical Exam ?Vitals and nursing note reviewed.  ?Constitutional:   ?   General: She is awake. She is not in acute distress. ?   Appearance: She is well-developed. She is not ill-appearing.  ?HENT:  ?   Head: Normocephalic.  ?   Right Ear: Hearing normal.  ?   Left Ear: Hearing normal.  ?Eyes:  ?   General: Lids are normal.     ?   Right eye: No  discharge.     ?   Left eye: No discharge.  ?   Conjunctiva/sclera: Conjunctivae normal.  ?   Pupils: Pupils are equal, round, and reactive to light.  ?Neck:  ?   Thyroid: No thyromegaly.  ?   Vascular: No carotid bruit.  ?Cardiovascular:  ?   Rate and Rhythm: Normal rate and regular rhythm.  ?   Heart sounds: Normal heart sounds. No murmur heard. ?  No gallop.  ?Pulmonary:  ?   Effort: Pulmonary effort is normal. No accessory muscle usage or respiratory distress.  ?   Breath sounds: Normal breath sounds.  ?Abdominal:  ?   General: Bowel sounds are normal.  ?   Palpations: Abdomen is soft.  ?Musculoskeletal:  ?   Cervical back: Normal range of motion and neck supple.  ?   Right lower leg: No edema.  ?   Left lower leg: No edema.  ?Skin: ?   General: Skin is warm and dry.  ?Neurological:  ?   Mental Status: She is alert and oriented to person, place, and time.  ?Psychiatric:     ?   Attention and Perception: Attention normal.     ?   Mood and Affect: Mood normal.     ?   Behavior: Behavior normal. Behavior is cooperative.     ?   Thought Content: Thought content normal.     ?   Judgment: Judgment normal.  ? ?Diabetic Foot Exam - Simple   ?Simple Foot Form ?Visual Inspection ?No deformities, no ulcerations, no other skin breakdown bilaterally: Yes ?Sensation Testing ?Intact to touch and monofilament testing bilaterally: Yes ?Pulse Check ?Posterior Tibialis and Dorsalis pulse intact bilaterally: Yes ?Comments ?  ?  ?Results for orders placed or performed in visit on 04/15/21  ?Bayer DCA Hb A1c Waived  ?Result Value Ref Range  ? HB A1C (BAYER DCA - WAIVED) 7.4 (H) 4.8 - 5.6 %  ?Microalbumin, Urine Waived  ?Result Value Ref Range  ? Microalb, Ur Waived 30 (H) 0 - 19 mg/L  ? Creatinine, Urine Waived 200 10 - 300 mg/dL  ? Microalb/Creat Ratio <30 <30 mg/g  ? ?   ?Assessment & Plan:  ? ?Problem List Items Addressed This Visit   ? ?  ? Endocrine  ? Hyperlipidemia associated with type 2 diabetes mellitus (HCC)  ?  Chronic,  ongoing.  Continue current medication regimen and adjust as needed.  Can consider discontinuation of this if patient wishes to minimize medications in  future.  Lipid panel today. ?  ?  ? Relevant Orders  ? Bayer DCA Hb A1c Waived (Completed)  ? Hypothyroidism  ?  Chronic, ongoing.  Continue current medication regimen and adjust as needed.  TSH and Free T4 up to date.  Refills as needed. ?  ?  ? Type 2 diabetes, diet controlled (HCC) - Primary  ?  Chronic, stable, diet-controlled.  A1C today 7.4% and previous 7.3%. Urine micro 30 and A:C <30 tin March 2023.   Recommend continued focus on diet.  Due to advanced age more lenient goal recommended, A1C <8.  Avoid hypoglycemia.  Initiate medication in future if needed.  Return in 6 months. ?  ?  ? Relevant Orders  ? Bayer DCA Hb A1c Waived (Completed)  ? Microalbumin, Urine Waived (Completed)  ?  ? Musculoskeletal and Integument  ? Osteoporosis  ?  Chronic, ongoing.  Fosamax stopped over a year ago for holiday and will continue off due to recent jaw and teeth issues with treatment.  Plan for repeat DEXA order next visit. ?  ?  ? 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  Chronic, stable.  No further medications.  Is to return to rheumatology as needed.  At this time is asymptomatic. ?  ?  ?  ? Other  ? PMR (polymyalgia rheumatica) (HCC)  ?  Previously monitored by rheumatology with last visit in February 2021, symptoms have improved and she is to return as needed only.   ?  ?  ?  ? ?Follow up plan: ?Return in about 6 months (around 10/15/2021) for T2DM, HLD, THYROID, OSTEOPOROSIS, ARTHRITIS -- I will draw her labs. ?

## 2021-04-15 NOTE — Assessment & Plan Note (Signed)
Chronic, stable.  No further medications.  Is to return to rheumatology as needed.  At this time is asymptomatic. 

## 2021-04-15 NOTE — Assessment & Plan Note (Signed)
Chronic, stable, diet-controlled.  A1C today 7.4% and previous 7.3%. Urine micro 30 and A:C <30 tin March 2023.   Recommend continued focus on diet.  Due to advanced age more lenient goal recommended, A1C <8.  Avoid hypoglycemia.  Initiate medication in future if needed.  Return in 6 months. ?

## 2021-04-15 NOTE — Assessment & Plan Note (Signed)
Chronic, ongoing.  Fosamax stopped over a year ago for holiday and will continue off due to recent jaw and teeth issues with treatment.  Plan for repeat DEXA order next visit. ?

## 2021-04-15 NOTE — Assessment & Plan Note (Signed)
Previously monitored by rheumatology with last visit in February 2021, symptoms have improved and she is to return as needed only.   

## 2021-04-15 NOTE — Assessment & Plan Note (Signed)
Chronic, ongoing.  Continue current medication regimen and adjust as needed.  Can consider discontinuation of this if patient wishes to minimize medications in future.  Lipid panel today. 

## 2021-04-15 NOTE — Assessment & Plan Note (Addendum)
Chronic, ongoing.  Continue current medication regimen and adjust as needed.  TSH and Free T4 up to date.  Refills as needed. ?

## 2021-06-12 ENCOUNTER — Ambulatory Visit: Payer: Medicare PPO

## 2021-06-13 ENCOUNTER — Ambulatory Visit: Payer: Medicare PPO

## 2021-06-16 ENCOUNTER — Ambulatory Visit (INDEPENDENT_AMBULATORY_CARE_PROVIDER_SITE_OTHER): Payer: Medicare PPO | Admitting: *Deleted

## 2021-06-16 DIAGNOSIS — Z0000 Encounter for general adult medical examination without abnormal findings: Secondary | ICD-10-CM | POA: Diagnosis not present

## 2021-06-16 NOTE — Progress Notes (Signed)
Subjective:   Kirsten Riley is a 88 y.o. female who presents for Medicare Annual (Subsequent) preventive examination.  I connected with  Kirsten Riley on 06/16/21 by a telephone enabled telemedicine application and verified that I am speaking with the correct person using two identifiers.   I discussed the limitations of evaluation and management by telemedicine. The patient expressed understanding and agreed to proceed.  Patient location: home      Provider location:  Tele-health-home    Review of Systems     Cardiac Risk Factors include: advanced age (>65men, >63 women);family history of premature cardiovascular disease     Objective:    Today's Vitals   There is no height or weight on file to calculate BMI.     06/16/2021   10:34 AM 06/07/2020   10:33 AM 06/16/2018   12:44 PM 04/15/2016   10:13 AM 04/15/2016   10:06 AM 01/09/2015    9:29 AM  Advanced Directives  Does Patient Have a Medical Advance Directive? No Yes Yes  Yes No  Type of Advance Directive  Out of facility DNR (pink MOST or yellow form) Living will;Healthcare Power of [employer] of [place];Living will   Copy of Healthcare Power of Attorney in Chart?   No - copy requested No - copy requested    Would patient like information on creating a medical advance directive? No - Patient declined         Current Medications (verified) Outpatient Encounter Medications as of 06/16/2021  Medication Sig   chlorhexidine (PERIDEX) 0.12 % solution SMARTSIG:By Mouth   cholecalciferol (VITAMIN D3) 25 MCG (1000 UNIT) tablet Take 2,000 Units by mouth daily. Take once a day   levothyroxine (SYNTHROID) 75 MCG tablet TAKE 1 TABLET BY MOUTH ONCE A DAY   lovastatin (MEVACOR) 40 MG tablet TAKE 1 TABLET BY MOUTH EVERY NIGHT AT BEDTIME   Misc Natural Products (NEURIVA PO) Take by mouth. (Patient not taking: Reported on 06/16/2021)   Multiple Vitamin (MULTIVITAMIN WITH MINERALS) TABS tablet Take 1 tablet by mouth daily.  (Patient not taking: Reported on 06/16/2021)   No facility-administered encounter medications on file as of 06/16/2021.    Allergies (verified) Sulfa antibiotics and Penicillins   History: Past Medical History:  Diagnosis Date   CAD (coronary artery disease)    Diabetes mellitus (HCC) 12/10/2014   Fractured rib    Hyperlipidemia    Hypertension    Hypothyroidism    Polymyalgia rheumatica (HCC) 06/08/2014   Past Surgical History:  Procedure Laterality Date   ABDOMINAL HYSTERECTOMY  1978   partial   CORONARY ARTERY BYPASS GRAFT     hypercholesterol     hypothyroid     Family History  Problem Relation Age of Onset   Heart disease Mother    Stroke Mother    Hypertension Mother    Cancer Father    Cancer Sister        breast, ovarian, pancreatic   Heart disease Sister        x2   Cancer Brother    Heart disease Brother    Cancer Brother    Heart disease Brother    Cancer Brother    Heart disease Brother    Social History   Socioeconomic History   Marital status: Widowed    Spouse name: Not on file   Number of children: Not on file   Years of education: Not on file   Highest education level: 12th grade  Occupational  History   Occupation: retired  Tobacco Use   Smoking status: Never   Smokeless tobacco: Never  Vaping Use   Vaping Use: Never used  Substance and Sexual Activity   Alcohol use: No    Alcohol/week: 0.0 standard drinks   Drug use: No   Sexual activity: Never  Other Topics Concern   Not on file  Social History Narrative   Not on file   Social Determinants of Health   Financial Resource Strain: Low Risk    Difficulty of Paying Living Expenses: Not hard at all  Food Insecurity: No Food Insecurity   Worried About Programme researcher, broadcasting/film/video in the Last Year: Never true   Ran Out of Food in the Last Year: Never true  Transportation Needs: No Transportation Needs   Lack of Transportation (Medical): No   Lack of Transportation (Non-Medical): No  Physical  Activity: Not on file  Stress: No Stress Concern Present   Feeling of Stress : Not at all  Social Connections: Moderately Isolated   Frequency of Communication with Friends and Family: More than three times a week   Frequency of Social Gatherings with Friends and Family: Twice a week   Attends Religious Services: More than 4 times per year   Active Member of [employer] or Organizations: No   Attends Banker Meetings: Never   Marital Status: Widowed    Tobacco Counseling Counseling given: Not Answered   Clinical Intake:  Pre-visit preparation completed: Yes  Pain : No/denies pain     Nutritional Risks: None Diabetes: No  How often do you need to have someone help you when you read instructions, pamphlets, or other written materials from your doctor or pharmacy?: 1 - Never  Diabetic?  No medications  Interpreter Needed?: No  Information entered by :: Remi Haggard LPN   Activities of Daily Living    06/16/2021   10:51 AM  In your present state of health, do you have any difficulty performing the following activities:  Hearing? 0  Vision? 0  Difficulty concentrating or making decisions? 0  Walking or climbing stairs? 1  Dressing or bathing? 0  Doing errands, shopping? 0  Preparing Food and eating ? N  Using the Toilet? N  In the past six months, have you accidently leaked urine? N  Do you have problems with loss of bowel control? N  Managing your Medications? N  Managing your Finances? N  Housekeeping or managing your Housekeeping? N    Patient Care Team: Marjie Skiff, NP as PCP - General (Nurse Practitioner) Kandyce Rud., MD (Rheumatology)  Indicate any recent Medical Services you may have received from other than [facility] providers in the past year (date may be approximate).     Assessment:   This is a routine wellness examination for [name].  Hearing/Vision screen Hearing Screening - Comments:: No trouble hearing Vision Screening -  Comments:: Up to date [facility]  Dietary issues and exercise activities discussed: Current Exercise Habits: Home exercise routine, Time (Minutes): 20, Frequency (Times/Week): 2, Weekly Exercise (Minutes/Week): 40, Intensity: Mild   Goals Addressed             This Visit's Progress    Patient Stated       Continue current lifestyle       Depression Screen    06/07/2020   10:35 AM 10/16/2019    9:40 AM 06/16/2018   10:41 AM 04/15/2017    8:58 AM 04/15/2016   10:50 AM  10/18/2015    9:03 AM 07/31/2014    9:57 AM  PHQ 2/9 Scores  PHQ - 2 Score 1 0 0 0 0 0 0  PHQ- 9 Score     0      Fall Risk    06/16/2021   10:34 AM 06/07/2020   10:34 AM 05/31/2019    3:27 PM 06/16/2018   10:41 AM 04/15/2017    8:58 AM  Fall Risk   Falls in the past year? 0 0 0 0 No  Number falls in past yr: 0  0 0   Injury with Fall? 0  0 1   Risk for fall due to : Impaired balance/gait Medication side effect     Follow up Falls evaluation completed;Education provided;Falls prevention discussed Falls evaluation completed;Education provided;Falls prevention discussed       FALL RISK PREVENTION PERTAINING TO THE HOME:  Any stairs in or around the home? No  If so, are there any without handrails? No  Home free of loose throw rugs in walkways, pet beds, electrical cords, etc? Yes  Adequate lighting in your home to reduce risk of falls? Yes   ASSISTIVE DEVICES UTILIZED TO PREVENT FALLS:  Life alert? No  Use of a cane, walker or w/c? No  Grab bars in the bathroom? Yes  Shower chair or bench in shower? Yes  Elevated toilet seat or a handicapped toilet? No   TIMED UP AND GO:  Was the test performed? No .    Cognitive Function:        06/16/2021   10:35 AM 06/07/2020   10:44 AM 06/16/2018   12:45 PM 04/15/2017    9:01 AM  6CIT Screen  What Year? 0 points 0 points 0 points 0 points  What month? 0 points 0 points 0 points 0 points  What time? 0 points 0 points 0 points 0 points  Count back from 20 0 points  0 points 0 points 0 points  Months in reverse 0 points 0 points 0 points 0 points  Repeat phrase 6 points 2 points 0 points 0 points  Total Score 6 points 2 points 0 points 0 points    Immunizations Immunization History  Administered Date(s) Administered   Fluad Quad(high Dose 65+) 10/10/2018, 10/16/2019, 10/15/2020   Influenza, High Dose Seasonal PF 10/18/2015, 10/16/2016, 10/19/2017   Influenza,inj,Quad PF,6+ Mos 10/09/2014   Pneumococcal Conjugate-13 10/09/2013   Pneumococcal Polysaccharide-23 11/08/2005   Td 09/22/2005   Tdap 10/18/2015   Unspecified SARS-COV-2 Vaccination 02/13/2019, 03/13/2019   Zoster Recombinat (Shingrix) 03/27/2021   Zoster, Live 09/22/2005    TDAP status: Up to date  Flu Vaccine status: Up to date  Pneumococcal vaccine status: Up to date  Covid-19 vaccine status: Information provided on how to obtain vaccines.   Qualifies for Shingles Vaccine? No   Zostavax completed Yes   Shingrix Completed?: Yes  Screening Tests Health Maintenance  Topic Date Due   OPHTHALMOLOGY EXAM  03/15/2021   Zoster Vaccines- Shingrix (2 of 2) 05/22/2021   COVID-19 Vaccine (3 - Booster) 07/02/2021 (Originally 05/08/2019)   INFLUENZA VACCINE  08/12/2021   HEMOGLOBIN A1C  10/15/2021   FOOT EXAM  04/16/2022   URINE MICROALBUMIN  04/16/2022   TETANUS/TDAP  10/17/2025   Pneumonia Vaccine 59+ Years old  Completed   DEXA SCAN  Completed   HPV VACCINES  Aged Out    Health Maintenance  Health Maintenance Due  Topic Date Due   OPHTHALMOLOGY EXAM  03/15/2021   Zoster  Vaccines- Shingrix (2 of 2) 05/22/2021    Colorectal cancer screening: No longer required.   Mammogram status: No longer required due to  .  Bone Density no longer required  Lung Cancer Screening: (Low Dose CT Chest recommended if Age 21-80 years, 30 pack-year currently smoking OR have quit w/in 15years.) does not qualify.   Lung Cancer Screening Referral:   Additional Screening:  Hepatitis C  Screening: does not qualify;   Vision Screening: Recommended annual ophthalmology exams for early detection of glaucoma and other disorders of the eye. Is the patient up to date with their annual eye exam?  Yes  Who is the provider or what is the name of the office in which the patient attends annual eye exams? [facility] If pt is not established with a provider, would they like to be referred to a provider to establish care? No .   Dental Screening: Recommended annual dental exams for proper oral hygiene  Community Resource Referral / Chronic Care Management: CRR required this visit?  No   CCM required this visit?  No      Plan:     I have personally reviewed and noted the following in the patient's chart:   Medical and social history Use of alcohol, tobacco or illicit drugs  Current medications and supplements including opioid prescriptions.  Functional ability and status Nutritional status Physical activity Advanced directives List of other physicians Hospitalizations, surgeries, and ER visits in previous 12 months Vitals Screenings to include cognitive, depression, and falls Referrals and appointments  In addition, I have reviewed and discussed with patient certain preventive protocols, quality metrics, and best practice recommendations. A written personalized care plan for preventive services as well as general preventive health recommendations were provided to patient.     Remi HaggardJulie Dalicia Kisner, LPN   1/6/10966/05/2021   Nurse Notes:

## 2021-06-16 NOTE — Patient Instructions (Signed)
Kirsten Riley , Thank you for taking time to come for your Medicare Wellness Visit. I appreciate your ongoing commitment to your health goals. Please review the following plan we discussed and let me know if I can assist you in the future.   Screening recommendations/referrals: Colonoscopy: no longer required Mammogram: no longer required Bone Density: no longer required Recommended yearly ophthalmology/optometry visit for glaucoma screening and checkup Recommended yearly dental visit for hygiene and checkup  Vaccinations: Influenza vaccine: up to date Pneumococcal vaccine: up to date Tdap vaccine: up to date Shingles vaccine:  up to date    Advanced directives: Education provided  Conditions/risks identified:   Next appointment: 10-15-2021 @ 9:00  [place]   Preventive Care 65 Years and Older, Female Preventive care refers to lifestyle choices and visits with your health care provider that can promote health and wellness. What does preventive care include? A yearly physical exam. This is also called an annual well check. Dental exams once or twice a year. Routine eye exams. Ask your health care provider how often you should have your eyes checked. Personal lifestyle choices, including: Daily care of your teeth and gums. Regular physical activity. Eating a healthy diet. Avoiding tobacco and drug use. Limiting alcohol use. Practicing safe sex. Taking low-dose aspirin every day. Taking vitamin and mineral supplements as recommended by your health care provider. What happens during an annual well check? The services and screenings done by your health care provider during your annual well check will depend on your age, overall health, lifestyle risk factors, and family history of disease. Counseling  Your health care provider may ask you questions about your: Alcohol use. Tobacco use. Drug use. Emotional well-being. Home and relationship well-being. Sexual activity. Eating  habits. History of falls. Memory and ability to understand (cognition). Work and work Astronomer. Reproductive health. Screening  You may have the following tests or measurements: Height, weight, and BMI. Blood pressure. Lipid and cholesterol levels. These may be checked every 5 years, or more frequently if you are over 65 years old. Skin check. Lung cancer screening. You may have this screening every year starting at age 84 if you have a 30-pack-year history of smoking and currently smoke or have quit within the past 15 years. Fecal occult blood test (FOBT) of the stool. You may have this test every year starting at age 107. Flexible sigmoidoscopy or colonoscopy. You may have a sigmoidoscopy every 5 years or a colonoscopy every 10 years starting at age 11. Hepatitis C blood test. Hepatitis B blood test. Sexually transmitted disease (STD) testing. Diabetes screening. This is done by checking your blood sugar (glucose) after you have not eaten for a while (fasting). You may have this done every 1-3 years. Bone density scan. This is done to screen for osteoporosis. You may have this done starting at age 8. Mammogram. This may be done every 1-2 years. Talk to your health care provider about how often you should have regular mammograms. Talk with your health care provider about your test results, treatment options, and if necessary, the need for more tests. Vaccines  Your health care provider may recommend certain vaccines, such as: Influenza vaccine. This is recommended every year. Tetanus, diphtheria, and acellular pertussis (Tdap, Td) vaccine. You may need a Td booster every 10 years. Zoster vaccine. You may need this after age 61. Pneumococcal 13-valent conjugate (PCV13) vaccine. One dose is recommended after age 32. Pneumococcal polysaccharide (PPSV23) vaccine. One dose is recommended after age 28. Talk to your  health care provider about which screenings and vaccines you need and how  often you need them. This information is not intended to replace advice given to you by your health care provider. Make sure you discuss any questions you have with your health care provider. Document Released: 01/25/2015 Document Revised: 09/18/2015 Document Reviewed: 10/30/2014 Elsevier Interactive Patient Education  2017 [place] Prevention in the Home Falls can cause injuries. They can happen to people of all ages. There are many things you can do to make your home safe and to help prevent falls. What can I do on the outside of my home? Regularly fix the edges of walkways and driveways and fix any cracks. Remove anything that might make you trip as you walk through a door, such as a raised step or threshold. Trim any bushes or trees on the path to your home. Use bright outdoor lighting. Clear any walking paths of anything that might make someone trip, such as rocks or tools. Regularly check to see if handrails are loose or broken. Make sure that both sides of any steps have handrails. Any raised decks and porches should have guardrails on the edges. Have any leaves, snow, or ice cleared regularly. Use sand or salt on walking paths during winter. Clean up any spills in your garage right away. This includes oil or grease spills. What can I do in the bathroom? Use night lights. Install grab bars by the toilet and in the tub and shower. Do not use towel bars as grab bars. Use non-skid mats or decals in the tub or shower. If you need to sit down in the shower, use a plastic, non-slip stool. Keep the floor dry. Clean up any water that spills on the floor as soon as it happens. Remove soap buildup in the tub or shower regularly. Attach bath mats securely with double-sided non-slip rug tape. Do not have throw rugs and other things on the floor that can make you trip. What can I do in the bedroom? Use night lights. Make sure that you have a light by your bed that is easy to  reach. Do not use any sheets or blankets that are too big for your bed. They should not hang down onto the floor. Have a firm chair that has side arms. You can use this for support while you get dressed. Do not have throw rugs and other things on the floor that can make you trip. What can I do in the kitchen? Clean up any spills right away. Avoid walking on wet floors. Keep items that you use a lot in easy-to-reach places. If you need to reach something above you, use a strong step stool that has a grab bar. Keep electrical cords out of the way. Do not use floor polish or wax that makes floors slippery. If you must use wax, use non-skid floor wax. Do not have throw rugs and other things on the floor that can make you trip. What can I do with my stairs? Do not leave any items on the stairs. Make sure that there are handrails on both sides of the stairs and use them. Fix handrails that are broken or loose. Make sure that handrails are as long as the stairways. Check any carpeting to make sure that it is firmly attached to the stairs. Fix any carpet that is loose or worn. Avoid having throw rugs at the top or bottom of the stairs. If you do have throw rugs, attach them  to the floor with carpet tape. Make sure that you have a light switch at the top of the stairs and the bottom of the stairs. If you do not have them, ask someone to add them for you. What else can I do to help prevent falls? Wear shoes that: Do not have high heels. Have rubber bottoms. Are comfortable and fit you well. Are closed at the toe. Do not wear sandals. If you use a stepladder: Make sure that it is fully opened. Do not climb a closed stepladder. Make sure that both sides of the stepladder are locked into place. Ask someone to hold it for you, if possible. Clearly mark and make sure that you can see: Any grab bars or handrails. First and last steps. Where the edge of each step is. Use tools that help you move  around (mobility aids) if they are needed. These include: Canes. Walkers. Scooters. Crutches. Turn on the lights when you go into a dark area. Replace any light bulbs as soon as they burn out. Set up your furniture so you have a clear path. Avoid moving your furniture around. If any of your floors are uneven, fix them. If there are any pets around you, be aware of where they are. Review your medicines with your doctor. Some medicines can make you feel dizzy. This can increase your chance of falling. Ask your doctor what other things that you can do to help prevent falls. This information is not intended to replace advice given to you by your health care provider. Make sure you discuss any questions you have with your health care provider. Document Released: 10/25/2008 Document Revised: 06/06/2015 Document Reviewed: 02/02/2014 Elsevier Interactive Patient Education  2017 [employer].

## 2021-10-10 DIAGNOSIS — M87180 Osteonecrosis due to drugs, jaw: Secondary | ICD-10-CM | POA: Insufficient documentation

## 2021-10-10 NOTE — Patient Instructions (Signed)
Eating Plan for Osteoporosis Osteoporosis causes your bones to become weak and brittle. This puts you at greater risk for bone breaks (fractures) from small bumps or falls. Making changes to your diet and increasing your physical activity can help strengthen your bones and improve your overall health. Calcium and vitamin D are nutrients that play an important role in bone health. Vitamin D helps your body use calcium and strengthen bones. It is important to get enough calcium and vitamin D as part of your eating plan for osteoporosis. What are tips for following this plan? Reading food labels Try to get at least 1,000 milligrams (mg) of calcium each day. Look for foods that have at least 50 mg of calcium per serving. Talk with your health care provider about taking a calcium supplement if you do not get enough calcium from food. Do not have more than 2,500 mg of calcium each day. This is the upper limit for food and nutritional supplements combined. Too much calcium may cause constipation and prevent you from absorbing other important nutrients. Choose foods that contain vitamin D. Take a daily vitamin supplement that contains 800-1,000 international units (IU) of vitamin D. The amount may be different depending on your age, body weight, and where you live. Talk with your dietitian or health care provider about how much vitamin D is right for you. Avoid foods that have more than 300 mg of sodium per serving. Too much sodium can cause your body to lose calcium. Talk with your dietitian or health care provider about how much sodium you are allowed each day. Shopping Do not buy foods with added salt, including: Salted snacks. Pickles. Canned soups. Canned meats. Processed meats, such as bacon or precooked or cured meat like sausages or meat loaves. Smoked fish. Meal planning Eat balanced meals that contain protein foods, fruits and vegetables, and foods rich in calcium and vitamin D. Eat at least  5 servings of fruits and vegetables each day. Eat 5-6 oz (142-170 g) of lean meat, poultry, fish, eggs, or beans each day. Lifestyle Do not use any products that contain nicotine or tobacco, such as cigarettes, e-cigarettes, and chewing tobacco. If you need help quitting, ask your health care provider. If your health care provider recommends that you lose weight: Work with a dietitian to develop an eating plan that will help you reach your desired weight goal. Exercise for at least 30 minutes a day, 5 or more days a week, or as told by your health care provider. Work with a physical therapist to develop an exercise plan that includes flexibility, balance, and strength exercises. Do not focus only on aerobic exercise. Do not drink alcohol if: Your health care provider tells you not to drink. You are pregnant, may be pregnant, or are planning to become pregnant. If you drink alcohol: Limit how much you use to: 0-1 drink a day for women. 0-2 drinks a day for men. Be aware of how much alcohol is in your drink. In the [place] one drink equals one 12 oz bottle of beer (355 mL), one 5 oz glass of wine (148 mL), or one 1 oz glass of hard liquor (44 mL). What foods should I eat? Foods high in calcium  Yogurt. Yogurt with fruit. Milk. Evaporated skim milk. Dry milk powder. Calcium-fortified orange juice. Parmesan cheese. Part-skim ricotta cheese. Natural hard cheese. Cream cheese. Cottage cheese. Canned sardines. Canned salmon. Calcium-treated tofu. Calcium-fortified cereal bar. Calcium-fortified cereal. Calcium-fortified graham crackers. Cooked collard greens. Turnip greens. Broccoli.   Kale. Almonds. White beans. Corn tortilla. Foods high in vitamin D Cod liver oil. Fatty fish, such as tuna, mackerel, and salmon. Milk. Fortified soy milk. Fortified fruit juice. Yogurt. Margarine. Egg yolks. Foods high in protein Beef. Lamb. Pork tenderloin. Chicken breast. Tuna (canned). Fish  fillet. Tofu. Cooked soy beans. Soy patty. Beans (canned or cooked). Cottage cheese. Yogurt. Peanut butter. Pumpkin seeds. Nuts. Sunflower seeds. Hard cheese. Milk or other milk products, such as soy milk. The items listed above may not be a complete list of foods and beverages you can eat. Contact a dietitian for more options. Summary Calcium and vitamin D are nutrients that play an important role in bone health and are an important part of your eating plan for osteoporosis. Eat balanced meals that contain protein foods, fruits and vegetables, and foods rich in calcium and vitamin D. Avoid foods that have more than 300 mg of sodium per serving. Too much sodium can cause your body to lose calcium. Exercise is an important part of prevention and treatment of osteoporosis. Aim for at least 30 minutes a day, 5 days a week. This information is not intended to replace advice given to you by your health care provider. Make sure you discuss any questions you have with your health care provider. Document Revised: 06/15/2019 Document Reviewed: 06/15/2019 Elsevier Patient Education  2023 [employer] Inc.  

## 2021-10-15 ENCOUNTER — Ambulatory Visit: Payer: Medicare PPO | Admitting: Nurse Practitioner

## 2021-10-15 ENCOUNTER — Encounter: Payer: Self-pay | Admitting: Nurse Practitioner

## 2021-10-15 VITALS — BP 120/73 | HR 53 | Temp 98.0°F | Ht 58.5 in | Wt 107.6 lb

## 2021-10-15 DIAGNOSIS — M0609 Rheumatoid arthritis without rheumatoid factor, multiple sites: Secondary | ICD-10-CM | POA: Diagnosis not present

## 2021-10-15 DIAGNOSIS — E038 Other specified hypothyroidism: Secondary | ICD-10-CM | POA: Diagnosis not present

## 2021-10-15 DIAGNOSIS — M353 Polymyalgia rheumatica: Secondary | ICD-10-CM | POA: Diagnosis not present

## 2021-10-15 DIAGNOSIS — Z23 Encounter for immunization: Secondary | ICD-10-CM | POA: Diagnosis not present

## 2021-10-15 DIAGNOSIS — M810 Age-related osteoporosis without current pathological fracture: Secondary | ICD-10-CM | POA: Diagnosis not present

## 2021-10-15 DIAGNOSIS — M87180 Osteonecrosis due to drugs, jaw: Secondary | ICD-10-CM | POA: Diagnosis not present

## 2021-10-15 DIAGNOSIS — E119 Type 2 diabetes mellitus without complications: Secondary | ICD-10-CM

## 2021-10-15 DIAGNOSIS — T458X5A Adverse effect of other primarily systemic and hematological agents, initial encounter: Secondary | ICD-10-CM

## 2021-10-15 DIAGNOSIS — E1169 Type 2 diabetes mellitus with other specified complication: Secondary | ICD-10-CM | POA: Diagnosis not present

## 2021-10-15 DIAGNOSIS — E785 Hyperlipidemia, unspecified: Secondary | ICD-10-CM | POA: Diagnosis not present

## 2021-10-15 LAB — BAYER DCA HB A1C WAIVED: HB A1C (BAYER DCA - WAIVED): 7.6 % — ABNORMAL HIGH (ref 4.8–5.6)

## 2021-10-15 MED ORDER — LOVASTATIN 40 MG PO TABS: 40.0000 mg | ORAL_TABLET | Freq: Every day | ORAL | 4 refills | Status: DC

## 2021-10-15 MED ORDER — LEVOTHYROXINE SODIUM 75 MCG PO TABS: 75.0000 ug | ORAL_TABLET | Freq: Every day | ORAL | 4 refills | Status: DC

## 2021-10-15 NOTE — Assessment & Plan Note (Signed)
Chronic, ongoing.  Fosamax stopped over a year ago for holiday and will continue off due to recent jaw and teeth issues with treatment.  Plan for repeat DEXA, ordered today and will remind patient to schedule.

## 2021-10-15 NOTE — Assessment & Plan Note (Signed)
Chronic, ongoing.  Continue current medication regimen and adjust as needed.  TSH and Free T4 on labs today.  Refills sent in.

## 2021-10-15 NOTE — Assessment & Plan Note (Signed)
Previously monitored by rheumatology with last visit in February 2021, symptoms have improved and she is to return as needed only.

## 2021-10-15 NOTE — Assessment & Plan Note (Signed)
Chronic, stable, diet-controlled.  A1c today 7.6% and previous 7.4% + 7.3%. Urine micro 30 and A:C <30 tin March 2023.   Recommend continued focus on diet at this time, discussed with her at length benefit of adding on medication -- however she refuses to start at this time.  Due to advanced age more lenient goal recommended, A1C <8 per geriatric society.  Avoid hypoglycemia.  Initiate medication in future if needed, could consider Metformin 500 MG BID.  Return in 6 months.

## 2021-10-15 NOTE — Assessment & Plan Note (Signed)
On 09/23/20 -- Fosamax was stopped and not restarted.  Will plan on repeat DEXA, order placed -- could consider possibly Prolia or IV therapy, would need endo referral for IV.

## 2021-10-15 NOTE — Assessment & Plan Note (Signed)
Chronic, stable.  No further medications.  Is to return to rheumatology as needed.  At this time is asymptomatic.

## 2021-10-15 NOTE — Progress Notes (Signed)
BP 120/73   Pulse (!) 53   Temp 98 F (36.7 C) (Oral)   Ht 4' 10.5" (1.486 m)   Wt 107 lb 9.6 oz (48.8 kg)   LMP  (LMP Unknown)   SpO2 95%   BMI 22.11 kg/m    Subjective:    Patient ID: Kirsten Riley, female    DOB: 06/19/1931, 88 y.o.   MRN: [id number]  HPI: Kirsten Riley is a 88 y.o. female  Chief Complaint  Patient presents with   Diabetes    Patient is scheduled for Diabetic Eye Exam in January 2024.    Hyperlipidemia   Osteoporosis   Arthritis   Hypothyroidism   DIABETES No current medications, continues on diet control with last A1c 7.4% in April.  She prefers to not take medication unless absolutely necessary.  Reports eating a lot chicken, but no sweets or increased carbs.   Hypoglycemic episodes:no Polydipsia/polyuria: no Visual disturbance: no Chest pain: no Paresthesias: no Glucose Monitoring: no             Accucheck frequency: Not Checking             Fasting glucose:             Post prandial:             Evening:             Before meals: Taking Insulin?: no             Long acting insulin:             Short acting insulin: Blood Pressure Monitoring: not checking Retinal Examination: Up to Date -- Dr. Wallace Going in January 2023 Foot Exam: Up to Date Pneumovax: Up to date Influenza: Up to Date Aspirin: no    HYPOTHYROIDISM Continues on Levothyroxine 75 MCG daily.   Satisfied with current treatment? yes Medication side effects: no Medication compliance: good compliance Etiology of hypothyroidism:  Recent dose adjustment: none Fatigue: none Cold intolerance: no Heat intolerance: no Weight gain: no Weight loss: no Constipation: no Diarrhea/loose stools: no Palpitations: no Lower extremity edema: no Anxiety/depressed mood: no    HYPERLIPIDEMIA Continues on Lovastatin 40 MG daily. Hyperlipidemia status: good compliance Satisfied with current treatment?  no Side effects:  no Medication compliance: good compliance Supplements:  none Aspirin:  no The ASCVD Risk score (Arnett DK, et al., 2019) failed to calculate for the following reasons:   The 2019 ASCVD risk score is only valid for ages 37 to 78   OSTEOPOROSIS & POLYMYALGIA RHEUMATICA: Last DEXA 03/06/2019 with T score -3.1.  Was followed by Dr. Jefm Bryant at [facility] for RA and osteoporosis, last visit in February 2021, does not need to come back unless needed, this is noted on review of note as well..    Fosamax discontinued due to jaw and dentition issues.  Is taking Vitamin D daily.  Saw specialist at [facility] for jaw issues, reports they are doing no further procedures at this time.  Does strength exercises at home for fall prevention.  No recent falls or fractures.       10/15/2021    9:31 AM 06/07/2020   10:35 AM 10/16/2019    9:40 AM 06/16/2018   10:41 AM 04/15/2017    8:58 AM  Depression screen PHQ 2/9  Decreased Interest 0 0 0 0 0  Down, Depressed, Hopeless 0 1 0 0 0  PHQ - 2 Score 0 1 0 0 0  Altered sleeping 0  Tired, decreased energy 0      Change in appetite 0      Feeling bad or failure about yourself  0      Trouble concentrating 0      Moving slowly or fidgety/restless 0      Suicidal thoughts 0      PHQ-9 Score 0      Difficult doing work/chores Not difficult at all           10/15/2021    9:32 AM  GAD 7 : Generalized Anxiety Score  Nervous, Anxious, on Edge 0  Control/stop worrying 0  Worry too much - different things 0  Trouble relaxing 0  Restless 0  Easily annoyed or irritable 0  Afraid - awful might happen 0  Total GAD 7 Score 0  Anxiety Difficulty Not difficult at all   Relevant past medical, surgical, family and social history reviewed and updated as indicated. Interim medical history since our last visit reviewed. Allergies and medications reviewed and updated.  Review of Systems  Constitutional:  Negative for activity change, appetite change, diaphoresis, fatigue and fever.  Respiratory:  Negative for cough, chest tightness and  shortness of breath.   Cardiovascular:  Negative for chest pain, palpitations and leg swelling.  Gastrointestinal: Negative.   Endocrine: Negative.   Neurological: Negative.   Psychiatric/Behavioral: Negative.    All other systems reviewed and are negative.  Per HPI unless specifically indicated above     Objective:    BP 120/73   Pulse (!) 53   Temp 98 F (36.7 C) (Oral)   Ht 4' 10.5" (1.486 m)   Wt 107 lb 9.6 oz (48.8 kg)   LMP  (LMP Unknown)   SpO2 95%   BMI 22.11 kg/m   Wt Readings from Last 3 Encounters:  10/15/21 107 lb 9.6 oz (48.8 kg)  04/15/21 114 lb 6.4 oz (51.9 kg)  10/15/20 113 lb (51.3 kg)    Physical Exam Vitals and nursing note reviewed.  Constitutional:      General: She is awake. She is not in acute distress.    Appearance: She is well-developed. She is not ill-appearing.  HENT:     Head: Normocephalic.     Right Ear: Hearing normal.     Left Ear: Hearing normal.  Eyes:     General: Lids are normal.        Right eye: No discharge.        Left eye: No discharge.     Conjunctiva/sclera: Conjunctivae normal.     Pupils: Pupils are equal, round, and reactive to light.  Neck:     Thyroid: No thyromegaly.     Vascular: No carotid bruit.  Cardiovascular:     Rate and Rhythm: Regular rhythm. Bradycardia present.     Heart sounds: Normal heart sounds. No murmur heard.    No gallop.  Pulmonary:     Effort: Pulmonary effort is normal. No accessory muscle usage or respiratory distress.     Breath sounds: Normal breath sounds.  Abdominal:     General: Bowel sounds are normal.     Palpations: Abdomen is soft.  Musculoskeletal:     Cervical back: Normal range of motion and neck supple.     Right lower leg: No edema.     Left lower leg: No edema.  Skin:    General: Skin is warm and dry.  Neurological:     Mental Status: She is alert and oriented to person, place,  and time.  Psychiatric:        Attention and Perception: Attention normal.        Mood  and Affect: Mood normal.        Behavior: Behavior normal. Behavior is cooperative.        Thought Content: Thought content normal.        Judgment: Judgment normal.    Results for orders placed or performed in visit on 04/15/21  Bayer DCA Hb A1c Waived  Result Value Ref Range   HB A1C (BAYER DCA - WAIVED) 7.4 (H) 4.8 - 5.6 %  Microalbumin, Urine Waived  Result Value Ref Range   Microalb, Ur Waived 30 (H) 0 - 19 mg/L   Creatinine, Urine Waived 200 10 - 300 mg/dL   Microalb/Creat Ratio <30 <30 mg/g      Assessment & Plan:   Problem List Items Addressed This Visit       Endocrine   Hyperlipidemia associated with type 2 diabetes mellitus (HCC)    Chronic, ongoing.  Continue current medication regimen and adjust as needed.  Can consider discontinuation of this if patient wishes to minimize medications in future.  Lipid panel today.      Relevant Medications   lovastatin (MEVACOR) 40 MG tablet   Other Relevant Orders   Bayer DCA Hb A1c Waived   Comprehensive metabolic panel   Lipid Panel w/o Chol/HDL Ratio   Hypothyroidism    Chronic, ongoing.  Continue current medication regimen and adjust as needed.  TSH and Free T4 on labs today.  Refills sent in.      Relevant Medications   levothyroxine (SYNTHROID) 75 MCG tablet   Other Relevant Orders   TSH   Thyroid peroxidase antibody   T4, free   Type 2 diabetes, diet controlled (HCC) - Primary    Chronic, stable, diet-controlled.  A1c today 7.6% and previous 7.4% + 7.3%. Urine micro 30 and A:C <30 tin March 2023.   Recommend continued focus on diet at this time, discussed with her at length benefit of adding on medication -- however she refuses to start at this time.  Due to advanced age more lenient goal recommended, A1C <8 per geriatric society.  Avoid hypoglycemia.  Initiate medication in future if needed, could consider Metformin 500 MG BID.  Return in 6 months.      Relevant Medications   lovastatin (MEVACOR) 40 MG tablet    Other Relevant Orders   Bayer DCA Hb A1c Waived     Musculoskeletal and Integument   Osteoporosis    Chronic, ongoing.  Fosamax stopped over a year ago for holiday and will continue off due to recent jaw and teeth issues with treatment.  Plan for repeat DEXA, ordered today and will remind patient to schedule.      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  No further medications.  Is to return to rheumatology as needed.  At this time is asymptomatic.      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  Will plan on repeat DEXA, order placed -- could consider possibly Prolia or IV therapy, would need endo referral for IV.      PMR (polymyalgia rheumatica) (HCC)  Previously monitored by rheumatology with last visit in February 2021, symptoms have improved and she is to return as needed only.        Relevant Orders   CBC with Differential/Platelet   Other Visit Diagnoses     Flu vaccine need       Flu vaccine provided today.   Relevant Orders   Flu Vaccine QUAD High Dose(Fluad) (Completed)        Follow up plan: Return in about 6 months (around 04/16/2022) for T2DM, HTN/HLD, OSTEOPOROSIS.

## 2021-10-15 NOTE — Assessment & Plan Note (Signed)
Chronic, ongoing.  Continue current medication regimen and adjust as needed.  Can consider discontinuation of this if patient wishes to minimize medications in future.  Lipid panel today.

## 2021-10-16 ENCOUNTER — Encounter: Payer: Self-pay | Admitting: Nurse Practitioner

## 2021-10-16 LAB — COMPREHENSIVE METABOLIC PANEL
ALT: 15 IU/L (ref 0–32)
AST: 23 IU/L (ref 0–40)
Albumin/Globulin Ratio: 2 (ref 1.2–2.2)
Albumin: 4.5 g/dL (ref 3.7–4.7)
Alkaline Phosphatase: 104 IU/L (ref 44–121)
BUN/Creatinine Ratio: 18 (ref 12–28)
BUN: 15 mg/dL (ref 8–27)
Bilirubin Total: 0.9 mg/dL (ref 0.0–1.2)
CO2: 23 mmol/L (ref 20–29)
Calcium: 10 mg/dL (ref 8.7–10.3)
Chloride: 101 mmol/L (ref 96–106)
Creatinine, Ser: 0.82 mg/dL (ref 0.57–1.00)
Globulin, Total: 2.2 g/dL (ref 1.5–4.5)
Glucose: 157 mg/dL — ABNORMAL HIGH (ref 70–99)
Potassium: 4.7 mmol/L (ref 3.5–5.2)
Sodium: 139 mmol/L (ref 134–144)
Total Protein: 6.7 g/dL (ref 6.0–8.5)
eGFR: 69 mL/min/{1.73_m2} (ref >59)

## 2021-10-16 LAB — CBC WITH DIFFERENTIAL/PLATELET
Basophils Absolute: 0.1 10*3/uL (ref 0.0–0.2)
Basos: 1 %
EOS (ABSOLUTE): 0.1 10*3/uL (ref 0.0–0.4)
Eos: 2 %
Hematocrit: 46.2 % (ref 34.0–46.6)
Hemoglobin: 14.7 g/dL (ref 11.1–15.9)
Immature Grans (Abs): 0 10*3/uL (ref 0.0–0.1)
Immature Granulocytes: 0 %
Lymphocytes Absolute: 2 10*3/uL (ref 0.7–3.1)
Lymphs: 31 %
MCH: 29.3 pg (ref 26.6–33.0)
MCHC: 31.8 g/dL (ref 31.5–35.7)
MCV: 92 fL (ref 79–97)
Monocytes Absolute: 0.5 10*3/uL (ref 0.1–0.9)
Monocytes: 9 %
Neutrophils Absolute: 3.7 10*3/uL (ref 1.4–7.0)
Neutrophils: 57 %
Platelets: 184 10*3/uL (ref 150–450)
RBC: 5.02 x10E6/uL (ref 3.77–5.28)
RDW: 12.5 % (ref 11.7–15.4)
WBC: 6.4 10*3/uL (ref 3.4–10.8)

## 2021-10-16 LAB — LIPID PANEL W/O CHOL/HDL RATIO
Cholesterol, Total: 133 mg/dL (ref 100–199)
HDL: 48 mg/dL (ref >39)
LDL Chol Calc (NIH): 66 mg/dL (ref 0–99)
Triglycerides: 101 mg/dL (ref 0–149)
VLDL Cholesterol Cal: 19 mg/dL (ref 5–40)

## 2021-10-16 LAB — THYROID PEROXIDASE ANTIBODY: Thyroperoxidase Ab SerPl-aCnc: 90 IU/mL — ABNORMAL HIGH (ref 0–34)

## 2021-10-16 LAB — VITAMIN D 25 HYDROXY (VIT D DEFICIENCY, FRACTURES): Vit D, 25-Hydroxy: 42 ng/mL (ref 30.0–100.0)

## 2021-10-16 LAB — TSH: TSH: 0.12 u[IU]/mL — ABNORMAL LOW (ref 0.450–4.500)

## 2021-10-16 LAB — T4, FREE: Free T4: 1.85 ng/dL — ABNORMAL HIGH (ref 0.82–1.77)

## 2021-10-16 NOTE — Progress Notes (Signed)
Contacted via [facility] -- however, please call to ensure she received message: Good evening Salvatrice, your labs have returned: - Kidney function, creatinine and eGFR, remains normal, as is liver function, AST and ALT.  Glucose (sugar) is elevated at 157 -- definitely focus on staying active and diet changes. - Thyroid levels are more hyperthyroid again, this could be affecting diabetes or vice versa as well -- I would recommend we make slight change to Levothyroxine, which I know concerns you.  We could make it where you take your 75 MCG Levothyroxine 5 days a week and then 50 MCG the remaining 2 days -- this might get your thyroid less overactive and help health overall.  Thoughts?  Do you want to try this?  If so I would like to check thyroid labs again on outpatient labs in 6 weeks, lab only visit.   - Remainder of labs are all stable.  Any questions? Keep being amazing!!  Thank you for allowing me to participate in your care.  I appreciate you. Kindest regards, Deanthony Maull

## 2021-10-20 ENCOUNTER — Telehealth: Payer: Self-pay | Admitting: *Deleted

## 2021-10-20 NOTE — Telephone Encounter (Signed)
Spoke with patient and made her aware of Jolene's recommendations. Patient verbalized understanding and has no further questions at this time.  °

## 2021-10-20 NOTE — Telephone Encounter (Signed)
  Chief Complaint: Results Symptoms: NA Frequency: NA Pertinent Negatives: Patient denies NA Disposition: [] ED /[] Urgent Care (no appt availability in office) / [] Appointment(In office/virtual)/ []  [facility]/ [] Home Care/ [] Refused Recommended Disposition /[] [facility] [facility]/ [x]  Follow-up with PCP Additional Notes: In regards to recent lab results, pt states "I want to leave everything as it is, no changes." States she is starting new diet to get sugar down. Noted pt spoke with Destiny 10/17/21.  Questioning if she needs F/U lab work since "Nothing is changing." Please advise

## 2022-03-18 ENCOUNTER — Encounter: Payer: Self-pay | Admitting: Nurse Practitioner

## 2022-04-07 DIAGNOSIS — Z961 Presence of intraocular lens: Secondary | ICD-10-CM | POA: Diagnosis not present

## 2022-04-07 DIAGNOSIS — H43813 Vitreous degeneration, bilateral: Secondary | ICD-10-CM | POA: Diagnosis not present

## 2022-04-07 DIAGNOSIS — E113293 Type 2 diabetes mellitus with mild nonproliferative diabetic retinopathy without macular edema, bilateral: Secondary | ICD-10-CM | POA: Diagnosis not present

## 2022-04-07 LAB — HM DIABETES EYE EXAM

## 2022-04-08 ENCOUNTER — Encounter: Payer: Self-pay | Admitting: Nurse Practitioner

## 2022-04-16 ENCOUNTER — Ambulatory Visit: Payer: Medicare PPO | Admitting: Nurse Practitioner

## 2022-04-18 NOTE — Patient Instructions (Signed)

## 2022-04-20 ENCOUNTER — Encounter: Payer: Self-pay | Admitting: Nurse Practitioner

## 2022-04-20 ENCOUNTER — Ambulatory Visit: Payer: Medicare PPO | Admitting: Nurse Practitioner

## 2022-04-20 VITALS — BP 138/66 | HR 56 | Temp 97.7°F | Ht 58.5 in | Wt 102.0 lb

## 2022-04-20 DIAGNOSIS — M0609 Rheumatoid arthritis without rheumatoid factor, multiple sites: Secondary | ICD-10-CM | POA: Diagnosis not present

## 2022-04-20 DIAGNOSIS — E1169 Type 2 diabetes mellitus with other specified complication: Secondary | ICD-10-CM | POA: Diagnosis not present

## 2022-04-20 DIAGNOSIS — M353 Polymyalgia rheumatica: Secondary | ICD-10-CM | POA: Diagnosis not present

## 2022-04-20 DIAGNOSIS — E063 Autoimmune thyroiditis: Secondary | ICD-10-CM | POA: Diagnosis not present

## 2022-04-20 DIAGNOSIS — E119 Type 2 diabetes mellitus without complications: Secondary | ICD-10-CM | POA: Diagnosis not present

## 2022-04-20 DIAGNOSIS — M87180 Osteonecrosis due to drugs, jaw: Secondary | ICD-10-CM | POA: Diagnosis not present

## 2022-04-20 DIAGNOSIS — E785 Hyperlipidemia, unspecified: Secondary | ICD-10-CM

## 2022-04-20 DIAGNOSIS — T458X5A Adverse effect of other primarily systemic and hematological agents, initial encounter: Secondary | ICD-10-CM | POA: Diagnosis not present

## 2022-04-20 DIAGNOSIS — M810 Age-related osteoporosis without current pathological fracture: Secondary | ICD-10-CM | POA: Diagnosis not present

## 2022-04-20 LAB — MICROALBUMIN, URINE WAIVED
Creatinine, Urine Waived: 10 mg/dL (ref 10–300)
Microalb, Ur Waived: 10 mg/L (ref 0–19)
Microalb/Creat Ratio: <30 mg/g (ref <30)

## 2022-04-20 LAB — BAYER DCA HB A1C WAIVED: HB A1C (BAYER DCA - WAIVED): 6.8 % — ABNORMAL HIGH (ref 4.8–5.6)

## 2022-04-20 NOTE — Assessment & Plan Note (Signed)
Chronic, stable, diet-controlled.  A1c today 6.8% and previous 7.6% + 7.4% -- trending down with diet changes, praised for this. Urine micro 22 April 2022.   Recommend continued focus on diet at this time, discussed with her at length benefit of adding on medication -- however she refuses to start at this time.  Due to advanced age more lenient goal recommended, A1C <8 per geriatric society.  Avoid hypoglycemia.  Initiate medication in future if needed, could consider Metformin 500 MG BID.  Return in 6 months.

## 2022-04-20 NOTE — Assessment & Plan Note (Signed)
On 09/23/20 -- Fosamax was stopped and not restarted.  She refuses repeat bone density testing.  Could consider possibly Prolia or IV therapy, would need endo referral for IV.  She refuses treatment at this time.

## 2022-04-20 NOTE — Progress Notes (Signed)
BP 138/66   Pulse (!) 56   Temp 97.7 F (36.5 C) (Oral)   Ht 4' 10.5" (1.486 m)   Wt 102 lb (46.3 kg)   LMP  (LMP Unknown)   SpO2 98%   BMI 20.95 kg/m    Subjective:    Patient ID: Kirsten Riley, female    DOB: 09/15/1931, 89 y.o.   MRN: [id number]  HPI: Kirsten FlightJoann H Moodie is a 89 y.o. female  Chief Complaint  Patient presents with   Diabetes   Hypertension   Hyperlipidemia   Osteoporosis   DIABETES A1c last visit was 7.6% in October.  No current medications, continues on diet control.  She prefers to not take medication unless absolutely necessary and refused starting last visit.  She reports she is doing better, has cut back on sweets/pasta/bread. Hypoglycemic episodes:no Polydipsia/polyuria: no Visual disturbance: no Chest pain: no Paresthesias: no Glucose Monitoring: no             Accucheck frequency: Not Checking             Fasting glucose:             Post prandial:             Evening:             Before meals: Taking Insulin?: no             Long acting insulin:             Short acting insulin: Blood Pressure Monitoring: not checking Retinal Examination: Up to Date -- Dr. Inez PilgrimBrasington in March 2024 Foot Exam: Up to Date Pneumovax: Up to date Influenza: Up to Date Aspirin: no    HYPOTHYROIDISM Continues on Levothyroxine 75 MCG daily.   Satisfied with current treatment? yes Medication side effects: no Medication compliance: good compliance Etiology of hypothyroidism:  Recent dose adjustment: none Fatigue: no -- increased energy since last visit Cold intolerance: no Heat intolerance: no Weight gain: no Weight loss: no Constipation: no Diarrhea/loose stools: no Palpitations: no Lower extremity edema: no Anxiety/depressed mood: no    HYPERLIPIDEMIA Continues on Lovastatin 40 MG daily. Hyperlipidemia status: good compliance Satisfied with current treatment?  no Side effects:  no Medication compliance: good compliance Supplements: none Aspirin:   no The ASCVD Risk score (Arnett DK, et al., 2019) failed to calculate for the following reasons:   The 2019 ASCVD risk score is only valid for ages 840 to 3879   OSTEOPOROSIS & POLYMYALGIA RHEUMATICA: Last DEXA 03/06/2019 with T score -3.1 -- she prefers not to obtain another DEXA.  Was followed [facility] and osteoporosis, last visit in February 2021, does not need to come back unless needed, this is noted on review of note as well.  Fosamax discontinued due to jaw and dentition issues.  Is taking Vitamin D and calcium daily.  [facility], reports they are doing no further procedures at this time.  Does strength exercises at home for fall prevention.  No recent falls or fractures.       04/20/2022   10:30 AM 04/20/2022   10:13 AM 10/15/2021    9:31 AM 06/07/2020   10:35 AM 10/16/2019    9:40 AM  Depression screen PHQ 2/9  Decreased Interest 0 3 0 0 0  Down, Depressed, Hopeless 0 3 0 1 0  PHQ - 2 Score 0 6 0 1 0  Altered sleeping  0 3 0    Tired, decreased energy 0 3 0    Change in appetite 0 3 0    Feeling bad or failure about yourself  0 3 0    Trouble concentrating 0 3 0    Moving slowly or fidgety/restless 0 3 0    Suicidal thoughts 0 3 0    PHQ-9 Score 0 27 0    Difficult doing work/chores Not difficult at all Not difficult at all Not difficult at all         04/20/2022   10:13 AM 10/15/2021    9:32 AM  GAD 7 : Generalized Anxiety Score  Nervous, Anxious, on Edge 0 0  Control/stop worrying 0 0  Worry too much - different things 0 0  Trouble relaxing 0 0  Restless 0 0  Easily annoyed or irritable 0 0  Afraid - awful might happen 0 0  Total GAD 7 Score 0 0  Anxiety Difficulty Not difficult at all Not difficult at all   Relevant past medical, surgical, family and social history reviewed and updated as indicated. Interim medical history since our last visit reviewed. Allergies and medications reviewed and updated.  Review of  Systems  Constitutional:  Negative for activity change, appetite change, diaphoresis, fatigue and fever.  Respiratory:  Negative for cough, chest tightness and shortness of breath.   Cardiovascular:  Negative for chest pain, palpitations and leg swelling.  Gastrointestinal: Negative.   Endocrine: Negative.   Neurological: Negative.   Psychiatric/Behavioral: Negative.    All other systems reviewed and are negative.  Per HPI unless specifically indicated above     Objective:    BP 138/66   Pulse (!) 56   Temp 97.7 F (36.5 C) (Oral)   Ht 4' 10.5" (1.486 m)   Wt 102 lb (46.3 kg)   LMP  (LMP Unknown)   SpO2 98%   BMI 20.95 kg/m   Wt Readings from Last 3 Encounters:  04/20/22 102 lb (46.3 kg)  10/15/21 107 lb 9.6 oz (48.8 kg)  04/15/21 114 lb 6.4 oz (51.9 kg)    Physical Exam Vitals and nursing note reviewed.  Constitutional:      General: She is awake. She is not in acute distress.    Appearance: She is well-developed. She is not ill-appearing.  HENT:     Head: Normocephalic.     Right Ear: Hearing normal.     Left Ear: Hearing normal.  Eyes:     General: Lids are normal.        Right eye: No discharge.        Left eye: No discharge.     Conjunctiva/sclera: Conjunctivae normal.     Pupils: Pupils are equal, round, and reactive to light.  Neck:     Thyroid: No thyromegaly.     Vascular: No carotid bruit.  Cardiovascular:     Rate and Rhythm: Regular rhythm. Bradycardia present.     Heart sounds: Normal heart sounds. No murmur heard.    No gallop.  Pulmonary:     Effort: Pulmonary effort is normal. No accessory muscle usage or respiratory distress.     Breath sounds: Normal breath sounds.  Abdominal:     General: Bowel sounds are normal.     Palpations: Abdomen is soft.  Musculoskeletal:     Cervical back: Normal range of motion and neck supple.     Right lower leg: No edema.     Left lower leg: No edema.  Skin:    General: Skin is warm and dry.   Neurological:     Mental Status: She is alert and oriented to person, place, and time.  Psychiatric:        Attention and Perception: Attention normal.        Mood and Affect: Mood normal.        Behavior: Behavior normal. Behavior is cooperative.        Thought Content: Thought content normal.        Judgment: Judgment normal.    Results for orders placed or performed in visit on 04/08/22  HM DIABETES EYE EXAM  Result Value Ref Range   HM Diabetic Eye Exam Retinopathy (A) No Retinopathy      Assessment & Plan:   Problem List Items Addressed This Visit       Endocrine   Hashimoto's thyroiditis    Chronic, ongoing.  Continue current medication regimen and adjust as needed.  TSH and Free T4 on labs today.  She prefers no medication adjustments due to past side effects with even small changes, last levels were more hyper which have discussed with her.  Monitor weight as has lost some, but also has been working on diet changes and exercise.      Relevant Orders   T4, free   TSH   Hyperlipidemia associated with type 2 diabetes mellitus    Chronic, ongoing.  Continue current medication regimen and adjust as needed.  Can consider discontinuation of this if patient wishes to minimize medications in future.  Lipid panel today.      Relevant Orders   Bayer DCA Hb A1c Waived   Lipid Panel w/o Chol/HDL Ratio   Type 2 diabetes, diet controlled - Primary    Chronic, stable, diet-controlled.  A1c today 6.8% and previous 7.6% + 7.4% -- trending down with diet changes, praised for this. Urine micro 22 April 2022.   Recommend continued focus on diet at this time, discussed with her at length benefit of adding on medication -- however she refuses to start at this time.  Due to advanced age more lenient goal recommended, A1C <8 per geriatric society.  Avoid hypoglycemia.  Initiate medication in future if needed, could consider Metformin 500 MG BID.  Return in 6 months.      Relevant Orders    Basic metabolic panel   Bayer DCA Hb B1Y Waived   Microalbumin, Urine Waived     Musculoskeletal and Integument   Osteoporosis    Chronic, ongoing.  Fosamax stopped > 1 year ago for holiday and will continue off due to jaw and teeth issues with treatment.  She refuses repeat bone density testing.  Is working on chair exercises and being active at home + taking Vitamin D and calcium supplements.      Rheumatoid arthritis of multiple sites without rheumatoid factor    Chronic, stable.  No further medications.  Is to return to rheumatology as needed.  At this time is asymptomatic.        Other   Bisphosphonate-related jaw necrosis    On 09/23/20 -- Fosamax was stopped and not restarted.  She refuses repeat bone density testing.  Could consider possibly Prolia or IV therapy, would need endo referral for IV.  She refuses treatment at this time.      PMR (polymyalgia rheumatica)    Previously monitored by rheumatology with last visit in February 2021, symptoms have improved and she is to return as needed only.  Follow up plan: Return in about 6 months (around 10/20/2022) for T2DM, HLD, THYROID, OSTEOPOROSIS.

## 2022-04-20 NOTE — Assessment & Plan Note (Signed)
Chronic, ongoing.  Continue current medication regimen and adjust as needed.  Can consider discontinuation of this if patient wishes to minimize medications in future.  Lipid panel today. 

## 2022-04-20 NOTE — Assessment & Plan Note (Signed)
Chronic, stable.  No further medications.  Is to return to rheumatology as needed.  At this time is asymptomatic. 

## 2022-04-20 NOTE — Assessment & Plan Note (Signed)
Previously monitored by rheumatology with last visit in February 2021, symptoms have improved and she is to return as needed only.   

## 2022-04-20 NOTE — Assessment & Plan Note (Signed)
Chronic, ongoing.  Continue current medication regimen and adjust as needed.  TSH and Free T4 on labs today.  She prefers no medication adjustments due to past side effects with even small changes, last levels were more hyper which have discussed with her.  Monitor weight as has lost some, but also has been working on diet changes and exercise.

## 2022-04-20 NOTE — Assessment & Plan Note (Signed)
Chronic, ongoing.  Fosamax stopped > 1 year ago for holiday and will continue off due to jaw and teeth issues with treatment.  She refuses repeat bone density testing.  Is working on chair exercises and being active at home + taking Vitamin D and calcium supplements.

## 2022-04-21 ENCOUNTER — Other Ambulatory Visit: Payer: Self-pay | Admitting: Nurse Practitioner

## 2022-04-21 DIAGNOSIS — E063 Autoimmune thyroiditis: Secondary | ICD-10-CM

## 2022-04-21 LAB — BASIC METABOLIC PANEL
BUN/Creatinine Ratio: 20 (ref 12–28)
BUN: 16 mg/dL (ref 8–27)
CO2: 18 mmol/L — ABNORMAL LOW (ref 20–29)
Calcium: 9.8 mg/dL (ref 8.7–10.3)
Chloride: 100 mmol/L (ref 96–106)
Creatinine, Ser: 0.82 mg/dL (ref 0.57–1.00)
Glucose: 142 mg/dL — ABNORMAL HIGH (ref 70–99)
Potassium: 4.6 mmol/L (ref 3.5–5.2)
Sodium: 137 mmol/L (ref 134–144)
eGFR: 69 mL/min/{1.73_m2} (ref >59)

## 2022-04-21 LAB — T4, FREE: Free T4: 1.81 ng/dL — ABNORMAL HIGH (ref 0.82–1.77)

## 2022-04-21 LAB — LIPID PANEL W/O CHOL/HDL RATIO
Cholesterol, Total: 115 mg/dL (ref 100–199)
HDL: 49 mg/dL (ref >39)
LDL Chol Calc (NIH): 51 mg/dL (ref 0–99)
Triglycerides: 71 mg/dL (ref 0–149)
VLDL Cholesterol Cal: 15 mg/dL (ref 5–40)

## 2022-04-21 LAB — TSH: TSH: 0.025 u[IU]/mL — ABNORMAL LOW (ref 0.450–4.500)

## 2022-04-21 MED ORDER — LEVOTHYROXINE SODIUM 75 MCG PO TABS: ORAL_TABLET | ORAL | 4 refills | Status: DC

## 2022-04-21 MED ORDER — LEVOTHYROXINE SODIUM 50 MCG PO TABS: ORAL_TABLET | ORAL | 4 refills | Status: DC

## 2022-04-21 NOTE — Addendum Note (Signed)
Addended by: Damica Gravlin T on: 04/21/2022 04:58 PM   Modules accepted: Orders  

## 2022-04-21 NOTE — Addendum Note (Signed)
Addended by: Aura Dials T on: 04/21/2022 04:58 PM   Modules accepted: Orders

## 2022-04-21 NOTE — Progress Notes (Signed)
Contacted via MyChart -- please call her though as does not always check MyChart: Good afternoon Shanira, your labs have returned: - Thyroid labs show more hyperactive levels this time compared to previous which is concerning as can affect heart health if too hyperactive, send heart into irregular pattern.  I do highly recommend we adjust Levothyroxine or if you are concerned about adjusting dose then perhaps try taking it 5 days a week instead of seven and we can see if this makes a different, however my preference would be to lower dose just a little to where you are taking 75 MCG 4 days a week and 50 MCG 3 days a week to help get back to normal range.  Please let me know if you are okay with this and then we would recheck labs in 6 weeks.  I am just very concerned with current levels, as can affect heart and cause more weight loss. - Remainder of labs are stable -- my main concern is thyroid at this time.  Any questions?  Please let me know what you would like to do.  Thank you:) Keep being amazing!!  Thank you for allowing me to participate in your care.  I appreciate you. Kindest regards, Charnise Lovan

## 2022-06-02 ENCOUNTER — Other Ambulatory Visit: Payer: Medicare PPO

## 2022-06-02 DIAGNOSIS — E063 Autoimmune thyroiditis: Secondary | ICD-10-CM

## 2022-06-03 LAB — T4, FREE: Free T4: 1.68 ng/dL (ref 0.82–1.77)

## 2022-06-03 LAB — TSH: TSH: 0.152 u[IU]/mL — ABNORMAL LOW (ref 0.450–4.500)

## 2022-06-03 NOTE — Progress Notes (Signed)
Contacted via MyChart but please call as she does not always check MyChart: Good morning Kirsten Riley, your labs have returned.  Your thyroid levels are improving and headed towards normal range.  However, they are still on low side (hyperthyroid) which can lead to issues with health.  I do recommend you continue Levothyroxine but change to taking the 75 MCG 3 days a week (Monday, Wednesday, Friday) and then the 50 MCG four days a week (Tuesday, Thursday, Saturday, Sunday).  Then we will recheck levels at next visit.  This regimen is helping though, we just need to tweak it a little more.  Any questions? Keep being stellar!!  Thank you for allowing me to participate in your care.  I appreciate you. Kindest regards, Kataleyah Carducci

## 2022-06-12 ENCOUNTER — Encounter: Payer: Self-pay | Admitting: Nurse Practitioner

## 2022-06-15 NOTE — Telephone Encounter (Signed)
Called and scheduled patient for 06/18/2022 @ 11:20 am.

## 2022-06-16 NOTE — Patient Instructions (Signed)

## 2022-06-18 ENCOUNTER — Ambulatory Visit: Payer: Medicare PPO | Admitting: Nurse Practitioner

## 2022-06-18 ENCOUNTER — Encounter: Payer: Self-pay | Admitting: Nurse Practitioner

## 2022-06-18 VITALS — BP 134/66 | HR 55 | Temp 97.6°F | Ht 58.5 in | Wt 99.6 lb

## 2022-06-18 DIAGNOSIS — E063 Autoimmune thyroiditis: Secondary | ICD-10-CM

## 2022-06-18 MED ORDER — LEVOTHYROXINE SODIUM 75 MCG PO TABS: 75.0000 ug | ORAL_TABLET | Freq: Every day | ORAL | 4 refills | Status: DC

## 2022-06-18 NOTE — Assessment & Plan Note (Signed)
Chronic, ongoing.  Continue current medication regimen and adjust as needed.  TSH and Free T4 up to date and more on hyper side, but she does not like medication changes.  Has tried changes in past and had similar side effects.  Refuses to change and wishes to maintain 75 MCG dosing, which we will at this time and she will alert provider if any issues -- such as heart palpitations.  She prefers no medication adjustments due to past side effects with even small changes.  Monitor weight as has lost some, but also has been working on diet changes and exercise.

## 2022-06-18 NOTE — Progress Notes (Signed)
BP 134/66 (BP Location: Left Arm, Patient Position: Sitting, Cuff Size: Normal)   Pulse (!) 55   Temp 97.6 F (36.4 C) (Oral)   Ht 4' 10.5" (1.486 m)   Wt 99 lb 9.6 oz (45.2 kg)   LMP  (LMP Unknown)   SpO2 97%   BMI 20.46 kg/m    Subjective:    Patient ID: Kirsten Riley, female    DOB: Oct 02, 1931, 89 y.o.   MRN: [id number]  HPI: Kirsten Riley is a 89 y.o. female  Chief Complaint  Patient presents with   Medication Problem    Medication change has been making her tired, she can not take of Levothyroxine. This change makes her weak, tired, and makes her heart work harder   HYPOTHYROIDISM Presents today as having issues with current regimen.  Recent labs noted more hyperthyroidism, TSH 0.152.  Reports the 50 MCG makes her tired and weak feeling -- has tried changes in past and same thing happened -- states felt palpitations with this too.    Wants to return to 75 MCG daily dosing which she had been on for years.  Her previous PCP tried to switch regimen too and she had same issues.   Thyroid control status: recent changes to regimen Satisfied with current treatment? no Medication side effects: yes Medication compliance: good compliance Etiology of hypothyroidism:  Recent dose adjustment:yes Fatigue: no Cold intolerance: no Heat intolerance: no Weight gain: no Weight loss: no Constipation: no Diarrhea/loose stools: no Palpitations: no Lower extremity edema: no Anxiety/depressed mood: no   Relevant past medical, surgical, family and social history reviewed and updated as indicated. Interim medical history since our last visit reviewed. Allergies and medications reviewed and updated.  Review of Systems  Constitutional:  Negative for activity change, appetite change, diaphoresis, fatigue and fever.  Respiratory:  Negative for cough, chest tightness and shortness of breath.   Cardiovascular:  Negative for chest pain, palpitations and leg swelling.  Gastrointestinal:  Negative.   Endocrine: Negative.   Neurological: Negative.   Psychiatric/Behavioral: Negative.    All other systems reviewed and are negative.   Per HPI unless specifically indicated above     Objective:    BP 134/66 (BP Location: Left Arm, Patient Position: Sitting, Cuff Size: Normal)   Pulse (!) 55   Temp 97.6 F (36.4 C) (Oral)   Ht 4' 10.5" (1.486 m)   Wt 99 lb 9.6 oz (45.2 kg)   LMP  (LMP Unknown)   SpO2 97%   BMI 20.46 kg/m   Wt Readings from Last 3 Encounters:  06/18/22 99 lb 9.6 oz (45.2 kg)  04/20/22 102 lb (46.3 kg)  10/15/21 107 lb 9.6 oz (48.8 kg)    Physical Exam Vitals and nursing note reviewed.  Constitutional:      General: She is awake. She is not in acute distress.    Appearance: She is well-developed. She is not ill-appearing.  HENT:     Head: Normocephalic.     Right Ear: Hearing normal.     Left Ear: Hearing normal.  Eyes:     General: Lids are normal.        Right eye: No discharge.        Left eye: No discharge.     Conjunctiva/sclera: Conjunctivae normal.     Pupils: Pupils are equal, round, and reactive to light.  Neck:     Thyroid: No thyromegaly.     Vascular: No carotid bruit.  Cardiovascular:  Rate and Rhythm: Regular rhythm. Bradycardia present.     Heart sounds: Normal heart sounds. No murmur heard.    No gallop.  Pulmonary:     Effort: Pulmonary effort is normal. No accessory muscle usage or respiratory distress.     Breath sounds: Normal breath sounds.  Abdominal:     General: Bowel sounds are normal.     Palpations: Abdomen is soft.  Musculoskeletal:     Cervical back: Normal range of motion and neck supple.     Right lower leg: No edema.     Left lower leg: No edema.  Skin:    General: Skin is warm and dry.  Neurological:     Mental Status: She is alert and oriented to person, place, and time.  Psychiatric:        Attention and Perception: Attention normal.        Mood and Affect: Mood normal.        Behavior:  Behavior normal. Behavior is cooperative.        Thought Content: Thought content normal.        Judgment: Judgment normal.    Diabetic Foot Exam - Simple   Simple Foot Form Visual Inspection No deformities, no ulcerations, no other skin breakdown bilaterally: Yes Sensation Testing Intact to touch and monofilament testing bilaterally: Yes Pulse Check Posterior Tibialis and Dorsalis pulse intact bilaterally: Yes Comments    Results for orders placed or performed in visit on 06/02/22  TSH  Result Value Ref Range   TSH 0.152 (L) 0.450 - 4.500 uIU/mL  T4, free  Result Value Ref Range   Free T4 1.68 0.82 - 1.77 ng/dL      Assessment & Plan:   Problem List Items Addressed This Visit       Endocrine   Hashimoto's thyroiditis - Primary    Chronic, ongoing.  Continue current medication regimen and adjust as needed.  TSH and Free T4 up to date and more on hyper side, but she does not like medication changes.  Has tried changes in past and had similar side effects.  Refuses to change and wishes to maintain 75 MCG dosing, which we will at this time and she will alert provider if any issues -- such as heart palpitations.  She prefers no medication adjustments due to past side effects with even small changes.  Monitor weight as has lost some, but also has been working on diet changes and exercise.      Relevant Medications   levothyroxine (SYNTHROID) 75 MCG tablet     Follow up plan: Return if symptoms worsen or fail to improve.

## 2022-07-07 ENCOUNTER — Ambulatory Visit (INDEPENDENT_AMBULATORY_CARE_PROVIDER_SITE_OTHER): Payer: Medicare PPO

## 2022-07-07 VITALS — Ht 58.5 in | Wt 99.0 lb

## 2022-07-07 DIAGNOSIS — Z0000 Encounter for general adult medical examination without abnormal findings: Secondary | ICD-10-CM | POA: Diagnosis not present

## 2022-07-07 NOTE — Patient Instructions (Signed)
Kirsten Riley , Thank you for taking time to come for your Medicare Wellness Visit. I appreciate your ongoing commitment to your health goals. Please review the following plan we discussed and let me know if I can assist you in the future.   These are the goals we discussed:  Goals      DIET - EAT MORE FRUITS AND VEGETABLES     DIET - INCREASE WATER INTAKE     Recommend drinking at least 6-8 glasses of water a day      Patient Stated     06/07/2020, no goals     Patient Stated     Continue current lifestyle        This is a list of the screening recommended for you and due dates:  Health Maintenance  Topic Date Due   COVID-19 Vaccine (3 - 2023-24 season) 09/12/2021   DEXA scan (bone density measurement)  04/20/2023*   Flu Shot  08/13/2022   Hemoglobin A1C  10/20/2022   Eye exam for diabetics  04/07/2023   Complete foot exam   06/18/2023   Medicare Annual Wellness Visit  07/07/2023   DTaP/Tdap/Td vaccine (3 - Td or Tdap) 10/17/2025   Pneumonia Vaccine  Completed   Zoster (Shingles) Vaccine  Completed   HPV Vaccine  Aged Out  *Topic was postponed. The date shown is not the original due date.    Advanced directives: no  Conditions/risks identified: none  Next appointment: Follow up in one year for your annual wellness visit 07/13/23 @ 8:45 am by phone   Preventive Care 65 Years and Older, Female Preventive care refers to lifestyle choices and visits with your health care provider that can promote health and wellness. What does preventive care include? A yearly physical exam. This is also called an annual well check. Dental exams once or twice a year. Routine eye exams. Ask your health care provider how often you should have your eyes checked. Personal lifestyle choices, including: Daily care of your teeth and gums. Regular physical activity. Eating a healthy diet. Avoiding tobacco and drug use. Limiting alcohol use. Practicing safe sex. Taking low-dose aspirin every  day. Taking vitamin and mineral supplements as recommended by your health care provider. What happens during an annual well check? The services and screenings done by your health care provider during your annual well check will depend on your age, overall health, lifestyle risk factors, and family history of disease. Counseling  Your health care provider may ask you questions about your: Alcohol use. Tobacco use. Drug use. Emotional well-being. Home and relationship well-being. Sexual activity. Eating habits. History of falls. Memory and ability to understand (cognition). Work and work Astronomer. Reproductive health. Screening  You may have the following tests or measurements: Height, weight, and BMI. Blood pressure. Lipid and cholesterol levels. These may be checked every 5 years, or more frequently if you are over 23 years old. Skin check. Lung cancer screening. You may have this screening every year starting at age 36 if you have a 30-pack-year history of smoking and currently smoke or have quit within the past 15 years. Fecal occult blood test (FOBT) of the stool. You may have this test every year starting at age 39. Flexible sigmoidoscopy or colonoscopy. You may have a sigmoidoscopy every 5 years or a colonoscopy every 10 years starting at age 60. Hepatitis C blood test. Hepatitis B blood test. Sexually transmitted disease (STD) testing. Diabetes screening. This is done by checking your blood sugar (glucose)  after you have not eaten for a while (fasting). You may have this done every 1-3 years. Bone density scan. This is done to screen for osteoporosis. You may have this done starting at age 46. Mammogram. This may be done every 1-2 years. Talk to your health care provider about how often you should have regular mammograms. Talk with your health care provider about your test results, treatment options, and if necessary, the need for more tests. Vaccines  Your health care  provider may recommend certain vaccines, such as: Influenza vaccine. This is recommended every year. Tetanus, diphtheria, and acellular pertussis (Tdap, Td) vaccine. You may need a Td booster every 10 years. Zoster vaccine. You may need this after age 56. Pneumococcal 13-valent conjugate (PCV13) vaccine. One dose is recommended after age 57. Pneumococcal polysaccharide (PPSV23) vaccine. One dose is recommended after age 19. Talk to your health care provider about which screenings and vaccines you need and how often you need them. This information is not intended to replace advice given to you by your health care provider. Make sure you discuss any questions you have with your health care provider. Document Released: 01/25/2015 Document Revised: 09/18/2015 Document Reviewed: 10/30/2014 Elsevier Interactive Patient Education  2017 [employer].  Fall Prevention in the Home Falls can cause injuries. They can happen to people of all ages. There are many things you can do to make your home safe and to help prevent falls. What can I do on the outside of my home? Regularly fix the edges of walkways and driveways and fix any cracks. Remove anything that might make you trip as you walk through a door, such as a raised step or threshold. Trim any bushes or trees on the path to your home. Use bright outdoor lighting. Clear any walking paths of anything that might make someone trip, such as rocks or tools. Regularly check to see if handrails are loose or broken. Make sure that both sides of any steps have handrails. Any raised decks and porches should have guardrails on the edges. Have any leaves, snow, or ice cleared regularly. Use sand or salt on walking paths during winter. Clean up any spills in your garage right away. This includes oil or grease spills. What can I do in the bathroom? Use night lights. Install grab bars by the toilet and in the tub and shower. Do not use towel bars as grab  bars. Use non-skid mats or decals in the tub or shower. If you need to sit down in the shower, use a plastic, non-slip stool. Keep the floor dry. Clean up any water that spills on the floor as soon as it happens. Remove soap buildup in the tub or shower regularly. Attach bath mats securely with double-sided non-slip rug tape. Do not have throw rugs and other things on the floor that can make you trip. What can I do in the bedroom? Use night lights. Make sure that you have a light by your bed that is easy to reach. Do not use any sheets or blankets that are too big for your bed. They should not hang down onto the floor. Have a firm chair that has side arms. You can use this for support while you get dressed. Do not have throw rugs and other things on the floor that can make you trip. What can I do in the kitchen? Clean up any spills right away. Avoid walking on wet floors. Keep items that you use a lot in easy-to-reach places. If  you need to reach something above you, use a strong step stool that has a grab bar. Keep electrical cords out of the way. Do not use floor polish or wax that makes floors slippery. If you must use wax, use non-skid floor wax. Do not have throw rugs and other things on the floor that can make you trip. What can I do with my stairs? Do not leave any items on the stairs. Make sure that there are handrails on both sides of the stairs and use them. Fix handrails that are broken or loose. Make sure that handrails are as long as the stairways. Check any carpeting to make sure that it is firmly attached to the stairs. Fix any carpet that is loose or worn. Avoid having throw rugs at the top or bottom of the stairs. If you do have throw rugs, attach them to the floor with carpet tape. Make sure that you have a light switch at the top of the stairs and the bottom of the stairs. If you do not have them, ask someone to add them for you. What else can I do to help prevent  falls? Wear shoes that: Do not have high heels. Have rubber bottoms. Are comfortable and fit you well. Are closed at the toe. Do not wear sandals. If you use a stepladder: Make sure that it is fully opened. Do not climb a closed stepladder. Make sure that both sides of the stepladder are locked into place. Ask someone to hold it for you, if possible. Clearly mark and make sure that you can see: Any grab bars or handrails. First and last steps. Where the edge of each step is. Use tools that help you move around (mobility aids) if they are needed. These include: Canes. Walkers. Scooters. Crutches. Turn on the lights when you go into a dark area. Replace any light bulbs as soon as they burn out. Set up your furniture so you have a clear path. Avoid moving your furniture around. If any of your floors are uneven, fix them. If there are any pets around you, be aware of where they are. Review your medicines with your doctor. Some medicines can make you feel dizzy. This can increase your chance of falling. Ask your doctor what other things that you can do to help prevent falls. This information is not intended to replace advice given to you by your health care provider. Make sure you discuss any questions you have with your health care provider. Document Released: 10/25/2008 Document Revised: 06/06/2015 Document Reviewed: 02/02/2014 Elsevier Interactive Patient Education  2017 [employer].

## 2022-07-07 NOTE — Progress Notes (Signed)
Subjective:   Kirsten Riley is a 89 y.o. female who presents for Medicare Annual (Subsequent) preventive examination.  Visit Complete: Virtual  I connected with  [name] on 07/07/22 by a audio enabled telemedicine application and verified that I am speaking with the correct person using two identifiers.  Patient Location: Home  Provider Location: Office/Clinic  I discussed the limitations of evaluation and management by telemedicine. The patient expressed understanding and agreed to proceed.   Review of Systems     Cardiac Risk Factors include: advanced age (>46men, >101 women);family history of premature cardiovascular disease;diabetes mellitus;dyslipidemia     Objective:    Today's Vitals   07/07/22 1501 07/07/22 1515  Weight:  99 lb (44.9 kg)  Height:  4' 10.5" (1.486 m)  PainSc: 0-No pain    Body mass index is 20.34 kg/m.     07/07/2022    3:06 PM 06/16/2021   10:34 AM 06/07/2020   10:33 AM 06/16/2018   12:44 PM 04/15/2016   10:13 AM 04/15/2016   10:06 AM 01/09/2015    9:29 AM  Advanced Directives  Does Patient Have a Medical Advance Directive? No No Yes Yes  Yes No  Type of Advance Directive   Out of facility DNR (pink MOST or yellow form) Living will;Healthcare Power of [employer] of [place];Living will   Copy of Healthcare Power of Attorney in Chart?    No - copy requested No - copy requested    Would patient like information on creating a medical advance directive? No - Patient declined No - Patient declined         Current Medications (verified) Outpatient Encounter Medications as of 07/07/2022  Medication Sig   chlorhexidine (PERIDEX) 0.12 % solution SMARTSIG:By Mouth   cholecalciferol (VITAMIN D3) 25 MCG (1000 UNIT) tablet Take 2,000 Units by mouth daily. Take once a day   levothyroxine (SYNTHROID) 75 MCG tablet Take 1 tablet (75 mcg total) by mouth daily before breakfast.   lovastatin (MEVACOR) 40 MG tablet Take 1 tablet (40 mg total) by mouth  at bedtime.   Omega-3 Fatty Acids (FISH OIL) 300 MG CAPS Take by mouth.   No facility-administered encounter medications on file as of 07/07/2022.    Allergies (verified) Sulfa antibiotics and Penicillins   History: Past Medical History:  Diagnosis Date   CAD (coronary artery disease)    Diabetes mellitus (HCC) 12/10/2014   Fractured rib    Hyperlipidemia    Hypertension    Hypothyroidism    Polymyalgia rheumatica (HCC) 06/08/2014   Past Surgical History:  Procedure Laterality Date   ABDOMINAL HYSTERECTOMY  1978   partial   CORONARY ARTERY BYPASS GRAFT     hypercholesterol     hypothyroid     Family History  Problem Relation Age of Onset   Heart disease Mother    Stroke Mother    Hypertension Mother    Cancer Father    Cancer Sister        breast, ovarian, pancreatic   Heart disease Sister        x2   Cancer Brother    Heart disease Brother    Cancer Brother    Heart disease Brother    Cancer Brother    Heart disease Brother    Social History   Socioeconomic History   Marital status: Widowed    Spouse name: Not on file   Number of children: Not on file   Years of education: Not  on file   Highest education level: 12th grade  Occupational History   Occupation: retired  Tobacco Use   Smoking status: Never   Smokeless tobacco: Never  Vaping Use   Vaping Use: Never used  Substance and Sexual Activity   Alcohol use: No    Alcohol/week: 0.0 standard drinks of alcohol   Drug use: No   Sexual activity: Never  Other Topics Concern   Not on file  Social History Narrative   Not on file   Social Determinants of Health   Financial Resource Strain: Low Risk  (07/07/2022)   Overall Financial Resource Strain (CARDIA)    Difficulty of Paying Living Expenses: Not hard at all  Food Insecurity: No Food Insecurity (07/07/2022)   Hunger Vital Sign    Worried About Running Out of Food in the Last Year: Never true    Ran Out of Food in the Last Year: Never true   Transportation Needs: No Transportation Needs (07/07/2022)   PRAPARE - Administrator, Civil Service (Medical): No    Lack of Transportation (Non-Medical): No  Physical Activity: Insufficiently Active (07/07/2022)   Exercise Vital Sign    Days of Exercise per Week: 3 days    Minutes of Exercise per Session: 30 min  Stress: No Stress Concern Present (07/07/2022)   [employer] of Occupational Health - Occupational Stress Questionnaire    Feeling of Stress : Not at all  Social Connections: Moderately Isolated (07/07/2022)   Social Connection and Isolation Panel [NHANES]    Frequency of Communication with Friends and Family: More than three times a week    Frequency of Social Gatherings with Friends and Family: Twice a week    Attends Religious Services: More than 4 times per year    Active Member of [employer] or Organizations: No    Attends Banker Meetings: Never    Marital Status: Widowed    Tobacco Counseling Counseling given: Not Answered   Clinical Intake:  Pre-visit preparation completed: Yes  Pain : No/denies pain Pain Score: 0-No pain     Nutritional Risks: None Diabetes: Yes CBG done?: No Did pt. bring in CBG monitor from home?: No  How often do you need to have someone help you when you read instructions, pamphlets, or other written materials from your doctor or pharmacy?: 1 - Never  Interpreter Needed?: No  Information entered by :: Kennedy Bucker, LPN   Activities of Daily Living    07/07/2022    3:02 PM  In your present state of health, do you have any difficulty performing the following activities:  Hearing? 0  Vision? 1  Difficulty concentrating or making decisions? 0  Walking or climbing stairs? 1  Dressing or bathing? 0  Doing errands, shopping? 0  Preparing Food and eating ? N  Using the Toilet? N  In the past six months, have you accidently leaked urine? N  Do you have problems with loss of bowel control? N  Managing  your Medications? N  Managing your Finances? N  Housekeeping or managing your Housekeeping? N    Patient Care Team: Marjie Skiff, NP as PCP - General (Nurse Practitioner) Kandyce Rud., MD (Rheumatology)  Indicate any recent Medical Services you may have received from other than [facility] providers in the past year (date may be approximate).     Assessment:   This is a routine wellness examination for [place].  Hearing/Vision screen Hearing Screening - Comments:: No aids Vision Screening -  Comments:: Readers- Dr.Brasington  Dietary issues and exercise activities discussed:     Goals Addressed             This Visit's Progress    DIET - EAT MORE FRUITS AND VEGETABLES         Depression Screen    07/07/2022    3:05 PM 06/18/2022   11:33 AM 04/20/2022   10:30 AM 04/20/2022   10:13 AM 10/15/2021    9:31 AM 06/07/2020   10:35 AM 10/16/2019    9:40 AM  PHQ 2/9 Scores  PHQ - 2 Score 0 0 0 6 0 1 0  PHQ- 9 Score 0 3 0 27 0      Fall Risk    07/07/2022    3:07 PM 06/18/2022   11:33 AM 04/20/2022   10:12 AM 06/16/2021   10:34 AM 06/07/2020   10:34 AM  Fall Risk   Falls in the past year? 0 0 0 0 0  Number falls in past yr: 0 0 0 0   Injury with Fall? 0 0 0 0   Risk for fall due to : No Fall Risks No Fall Risks No Fall Risks Impaired balance/gait Medication side effect  Follow up Falls prevention discussed;Falls evaluation completed Falls evaluation completed Falls evaluation completed Falls evaluation completed;Education provided;Falls prevention discussed Falls evaluation completed;Education provided;Falls prevention discussed    MEDICARE RISK AT HOME:  Medicare Risk at Home - 07/07/22 1507     Any stairs in or around the home? No    If so, are there any without handrails? No    Home free of loose throw rugs in walkways, pet beds, electrical cords, etc? Yes    Adequate lighting in your home to reduce risk of falls? Yes    Life alert? No    Use of a cane, walker or  w/c? Yes   walker at night   Grab bars in the bathroom? No    Shower chair or bench in shower? Yes    Elevated toilet seat or a handicapped toilet? Yes             TIMED UP AND GO:  Was the test performed?  No    Cognitive Function:        07/07/2022    3:08 PM 06/16/2021   10:35 AM 06/07/2020   10:44 AM 06/16/2018   12:45 PM 04/15/2017    9:01 AM  6CIT Screen  What Year? 0 points 0 points 0 points 0 points 0 points  What month? 0 points 0 points 0 points 0 points 0 points  What time? 0 points 0 points 0 points 0 points 0 points  Count back from 20 0 points 0 points 0 points 0 points 0 points  Months in reverse 4 points 0 points 0 points 0 points 0 points  Repeat phrase 4 points 6 points 2 points 0 points 0 points  Total Score 8 points 6 points 2 points 0 points 0 points    Immunizations Immunization History  Administered Date(s) Administered   Fluad Quad(high Dose 65+) 10/10/2018, 10/16/2019, 10/15/2020, 10/15/2021   Influenza, High Dose Seasonal PF 10/18/2015, 10/16/2016, 10/19/2017   Influenza,inj,Quad PF,6+ Mos 10/09/2014   Pneumococcal Conjugate-13 10/09/2013   Pneumococcal Polysaccharide-23 11/08/2005   Td 09/22/2005   Tdap 10/18/2015   Unspecified SARS-COV-2 Vaccination 02/13/2019, 03/13/2019   Zoster Recombinat (Shingrix) 03/27/2021, 06/02/2021   Zoster, Live 09/22/2005    TDAP status: Up to date  Flu Vaccine status:  Up to date  Pneumococcal vaccine status: Up to date  Covid-19 vaccine status: Completed vaccines  Qualifies for Shingles Vaccine? Yes   Zostavax completed Yes   Shingrix Completed?: Yes  Screening Tests Health Maintenance  Topic Date Due   COVID-19 Vaccine (3 - 2023-24 season) 09/12/2021   DEXA SCAN  04/20/2023 (Originally 03/04/2021)   INFLUENZA VACCINE  08/13/2022   HEMOGLOBIN A1C  10/20/2022   OPHTHALMOLOGY EXAM  04/07/2023   FOOT EXAM  06/18/2023   Medicare Annual Wellness (AWV)  07/07/2023   DTaP/Tdap/Td (3 - Td or Tdap)  10/17/2025   Pneumonia Vaccine 3+ Years old  Completed   Zoster Vaccines- Shingrix  Completed   HPV VACCINES  Aged Out    Health Maintenance  Health Maintenance Due  Topic Date Due   COVID-19 Vaccine (3 - 2023-24 season) 09/12/2021    Colorectal cancer screening: No longer required.   Mammogram status: No longer required due to age.   Lung Cancer Screening: (Low Dose CT Chest recommended if Age 29-80 years, 20 pack-year currently smoking OR have quit w/in 15years.) does not qualify.    Additional Screening:  Hepatitis C Screening: does not qualify; Completed no  Vision Screening: Recommended annual ophthalmology exams for early detection of glaucoma and other disorders of the eye. Is the patient up to date with their annual eye exam?  Yes  Who is the provider or what is the name of the office in which the patient attends annual eye exams? Dr.Brasington If pt is not established with a provider, would they like to be referred to a provider to establish care? No .   Dental Screening: Recommended annual dental exams for proper oral hygiene   Community Resource Referral / Chronic Care Management: CRR required this visit?  No   CCM required this visit?  No     Plan:     I have personally reviewed and noted the following in the patient's chart:   Medical and social history Use of alcohol, tobacco or illicit drugs  Current medications and supplements including opioid prescriptions. Patient is not currently taking opioid prescriptions. Functional ability and status Nutritional status Physical activity Advanced directives List of other physicians Hospitalizations, surgeries, and ER visits in previous 12 months Vitals Screenings to include cognitive, depression, and falls Referrals and appointments  In addition, I have reviewed and discussed with patient certain preventive protocols, quality metrics, and best practice recommendations. A written personalized care plan  for preventive services as well as general preventive health recommendations were provided to patient.     Hal Hope, LPN   07/08/9483   After Visit Summary: (MyChart) Due to this being a telephonic visit, the after visit summary with patients personalized plan was offered to patient via MyChart   Nurse Notes: none

## 2022-10-17 NOTE — Patient Instructions (Signed)

## 2022-10-20 ENCOUNTER — Ambulatory Visit: Payer: Medicare PPO | Admitting: Nurse Practitioner

## 2022-10-20 ENCOUNTER — Encounter: Payer: Self-pay | Admitting: Nurse Practitioner

## 2022-10-20 VITALS — BP 135/63 | HR 52 | Temp 97.5°F | Ht 59.0 in | Wt 96.6 lb

## 2022-10-20 DIAGNOSIS — E785 Hyperlipidemia, unspecified: Secondary | ICD-10-CM

## 2022-10-20 DIAGNOSIS — R001 Bradycardia, unspecified: Secondary | ICD-10-CM | POA: Diagnosis not present

## 2022-10-20 DIAGNOSIS — E063 Autoimmune thyroiditis: Secondary | ICD-10-CM | POA: Diagnosis not present

## 2022-10-20 DIAGNOSIS — E1169 Type 2 diabetes mellitus with other specified complication: Secondary | ICD-10-CM

## 2022-10-20 DIAGNOSIS — M0609 Rheumatoid arthritis without rheumatoid factor, multiple sites: Secondary | ICD-10-CM | POA: Diagnosis not present

## 2022-10-20 DIAGNOSIS — M353 Polymyalgia rheumatica: Secondary | ICD-10-CM

## 2022-10-20 DIAGNOSIS — E119 Type 2 diabetes mellitus without complications: Secondary | ICD-10-CM | POA: Diagnosis not present

## 2022-10-20 DIAGNOSIS — Z23 Encounter for immunization: Secondary | ICD-10-CM

## 2022-10-20 DIAGNOSIS — M810 Age-related osteoporosis without current pathological fracture: Secondary | ICD-10-CM | POA: Diagnosis not present

## 2022-10-20 MED ORDER — LOVASTATIN 40 MG PO TABS: 40.0000 mg | ORAL_TABLET | Freq: Every day | ORAL | 4 refills | Status: DC

## 2022-10-20 NOTE — Assessment & Plan Note (Signed)
Chronic, stable.  No further medications.  Is to return to rheumatology as needed.  At this time is asymptomatic. 

## 2022-10-20 NOTE — Assessment & Plan Note (Signed)
Previously monitored by rheumatology with last visit in February 2021, symptoms have improved and she is to return as needed only.   

## 2022-10-20 NOTE — Assessment & Plan Note (Signed)
Chronic, ongoing.  Continue current medication regimen and adjust as needed.  TSH and Free T4 today.  She has been more on hyper side last 3 checks, but she does not like medication changes and refuses to change dosing.  Has tried changes in past and had similar side effects.  Wishes to maintain 75 MCG dosing, which we will at this time and she will alert provider if any issues -- such as heart palpitations or increased anxiety. Monitor weight as has lost some, but also has been working on diet changes and exercise.

## 2022-10-20 NOTE — Assessment & Plan Note (Signed)
Chronic, ongoing.  Fosamax stopped > 2 years ago for holiday and will continue off due to jaw and teeth issues with treatment.  She refuses repeat bone density testing.  Is working on chair exercises and being active at home + taking Vitamin D and calcium supplements.

## 2022-10-20 NOTE — Progress Notes (Signed)
BP 135/63   Pulse (!) 52   Temp (!) 97.5 F (36.4 C) (Oral)   Ht 4\' 11"  (1.499 m)   Wt 96 lb 9.6 oz (43.8 kg)   LMP  (LMP Unknown)   SpO2 97%   BMI 19.51 kg/m    Subjective:    Patient ID: Kirsten Riley, female    DOB: 1931/04/19, 89 y.o.   MRN: [id number]  HPI: Kirsten Riley is a 89 y.o. female  Chief Complaint  Patient presents with   Diabetes   Hyperlipidemia   Hypothyroidism   Osteoporosis   DIABETES A1c in April 6.8%.  No current medications, continues on diet control.  She prefers to not take medication unless absolutely necessary.  She reports keeping diet strict at home. Hypoglycemic episodes:no Polydipsia/polyuria: no Visual disturbance: no Chest pain: no Paresthesias: no Glucose Monitoring: no             Accucheck frequency: Not Checking             Fasting glucose:             Post prandial:             Evening:             Before meals: Taking Insulin?: no             Long acting insulin:             Short acting insulin: Blood Pressure Monitoring: not checking Retinal Examination: Up to Date -- Dr. Inez Pilgrim last March 2024 Foot Exam: Up to Date Pneumovax: Up to date Influenza: Up to Date Aspirin: no    HYPOTHYROIDISM Continues on Levothyroxine 75 MCG daily.  Last TSH was 0.152, we have discussed that her recent levels are on low side and would benefit lowering Levothyroxine dosing.  Tried changing dose once, but if made her feel bad -- gets too tired and heart does no beat right.  She reports they tried to do this in past too and same things happened.  She absolutely refuses to change dosing on this. Satisfied with current treatment? yes Medication side effects: no Medication compliance: good compliance Etiology of hypothyroidism:  Recent dose adjustment: none Fatigue: no  Cold intolerance: no Heat intolerance: no Weight gain: no Weight loss: no Constipation: no Diarrhea/loose stools: no Palpitations: no Lower extremity edema:  no Anxiety/depressed mood: no    HYPERLIPIDEMIA Continues on Lovastatin 40 MG daily + Omega 3. Hyperlipidemia status: good compliance Satisfied with current treatment?  no Side effects:  no Medication compliance: good compliance Supplements: none Aspirin:  no The ASCVD Risk score (Arnett DK, et al., 2019) failed to calculate for the following reasons:   The 2019 ASCVD risk score is only valid for ages 41 to 87   OSTEOPOROSIS & POLYMYALGIA RHEUMATICA: DEXA 03/06/2019 with T score -3.1 -- she does not want ton obtain another DEXA.  Was followed by Dr. Gavin Potters at [facility] for RA and osteoporosis, last visit in February 2021, does not need to return unless needed, this is noted on review of note as well. Fosamax discontinued a couple years back due to jaw and dentition issues.  Is taking Vitamin D and calcium daily.  Had visit with specialist at [facility] for jaw issues, she is to return as needed.  Does strength exercises at home for fall prevention.  No recent falls or fractures.  Does not want to have any other treatments for osteoporosis, we have discussed.  Satisfied with current treatment?: yes - none, she wishes none Medication side effects: yes - jaw and dental issues presented Past osteoporosis medications/treatments: Fosamax Adequate calcium & vitamin D: yes Intolerance to bisphosphonates:yes Weight bearing exercises: yes      10/20/2022    8:57 AM 07/07/2022    3:05 PM 06/18/2022   11:33 AM 04/20/2022   10:30 AM 04/20/2022   10:13 AM  Depression screen PHQ 2/9  Decreased Interest 0 0 0 0 3  Down, Depressed, Hopeless 0 0 0 0 3  PHQ - 2 Score 0 0 0 0 6  Altered sleeping 0 0 0 0 3  Tired, decreased energy 0 0 0 0 3  Change in appetite 0 0 0 0 3  Feeling bad or failure about yourself  0 0 0 0 3  Trouble concentrating 0 0 0 0 3  Moving slowly or fidgety/restless 0 0 3 0 3  Suicidal thoughts 0 0 0 0 3  PHQ-9 Score 0 0 3 0 27  Difficult doing work/chores Not difficult at all Not difficult at  all Somewhat difficult Not difficult at all Not difficult at all       10/20/2022    8:58 AM 06/18/2022   11:33 AM 04/20/2022   10:13 AM 10/15/2021    9:32 AM  GAD 7 : Generalized Anxiety Score  Nervous, Anxious, on Edge 0 0 0 0  Control/stop worrying 0 0 0 0  Worry too much - different things 0 0 0 0  Trouble relaxing 0 0 0 0  Restless 0 0 0 0  Easily annoyed or irritable 0 0 0 0  Afraid - awful might happen 0 0 0 0  Total GAD 7 Score 0 0 0 0  Anxiety Difficulty Not difficult at all Not difficult at all Not difficult at all Not difficult at all   Relevant past medical, surgical, family and social history reviewed and updated as indicated. Interim medical history since our last visit reviewed. Allergies and medications reviewed and updated.  Review of Systems  Constitutional:  Negative for activity change, appetite change, diaphoresis, fatigue and fever.  Respiratory:  Negative for cough, chest tightness and shortness of breath.   Cardiovascular:  Negative for chest pain, palpitations and leg swelling.  Gastrointestinal: Negative.   Endocrine: Negative.   Neurological: Negative.   Psychiatric/Behavioral: Negative.    All other systems reviewed and are negative.  Per HPI unless specifically indicated above     Objective:    BP 135/63   Pulse (!) 52   Temp (!) 97.5 F (36.4 C) (Oral)   Ht 4\' 11"  (1.499 m)   Wt 96 lb 9.6 oz (43.8 kg)   LMP  (LMP Unknown)   SpO2 97%   BMI 19.51 kg/m   Wt Readings from Last 3 Encounters:  10/20/22 96 lb 9.6 oz (43.8 kg)  07/07/22 99 lb (44.9 kg)  06/18/22 99 lb 9.6 oz (45.2 kg)    Physical Exam Vitals and nursing note reviewed.  Constitutional:      General: She is awake. She is not in acute distress.    Appearance: She is well-developed. She is not ill-appearing.  HENT:     Head: Normocephalic.     Right Ear: Hearing normal.     Left Ear: Hearing normal.  Eyes:     General: Lids are normal.        Right eye: No discharge.         Left eye: No  discharge.     Conjunctiva/sclera: Conjunctivae normal.     Pupils: Pupils are equal, round, and reactive to light.  Neck:     Thyroid: No thyromegaly.     Vascular: No carotid bruit.  Cardiovascular:     Rate and Rhythm: Regular rhythm. Bradycardia present.     Heart sounds: Normal heart sounds. No murmur heard.    No gallop.  Pulmonary:     Effort: Pulmonary effort is normal. No accessory muscle usage or respiratory distress.     Breath sounds: Normal breath sounds.  Abdominal:     General: Bowel sounds are normal.     Palpations: Abdomen is soft.  Musculoskeletal:     Cervical back: Normal range of motion and neck supple.     Right lower leg: No edema.     Left lower leg: No edema.  Skin:    General: Skin is warm and dry.  Neurological:     Mental Status: She is alert and oriented to person, place, and time.  Psychiatric:        Attention and Perception: Attention normal.        Mood and Affect: Mood normal.        Behavior: Behavior normal. Behavior is cooperative.        Thought Content: Thought content normal.        Judgment: Judgment normal.    Results for orders placed or performed in visit on 06/02/22  TSH  Result Value Ref Range   TSH 0.152 (L) 0.450 - 4.500 uIU/mL  T4, free  Result Value Ref Range   Free T4 1.68 0.82 - 1.77 ng/dL      Assessment & Plan:   Problem List Items Addressed This Visit       Endocrine   Hashimoto's thyroiditis    Chronic, ongoing.  Continue current medication regimen and adjust as needed.  TSH and Free T4 today.  She has been more on hyper side last 3 checks, but she does not like medication changes and refuses to change dosing.  Has tried changes in past and had similar side effects.  Wishes to maintain 75 MCG dosing, which we will at this time and she will alert provider if any issues -- such as heart palpitations or increased anxiety. Monitor weight as has lost some, but also has been working on diet changes and  exercise.      Relevant Orders   T4, free   TSH   Hyperlipidemia associated with type 2 diabetes mellitus (HCC)    Chronic, ongoing.  Continue current medication regimen and adjust as needed.  Can consider discontinuation of this if patient wishes to minimize medications in future.  Lipid panel today.      Relevant Medications   lovastatin (MEVACOR) 40 MG tablet   Other Relevant Orders   Lipid Panel w/o Chol/HDL Ratio   Comprehensive metabolic panel   HgB A1c   Type 2 diabetes, diet controlled (HCC) - Primary    Chronic, stable, diet-controlled.  A1c 6.8% last visit and previous 7.6% + 7.4% -- trending down with diet changes, she continues to be focused on this and does not want to start medication.  Recheck A1c today. Urine micro 22 April 2022. Recommend continued focus on diet at this time, discussed with her at length benefit of adding on medication -- however she refuses to start at this time.  Due to advanced age more lenient goal recommended, A1C <8 per geriatric society.  Avoid hypoglycemia.  Initiate medication in future if needed, could consider Metformin 500 MG BID.  Return in 6 months.      Relevant Medications   lovastatin (MEVACOR) 40 MG tablet   Other Relevant Orders   Comprehensive metabolic panel   HgB A1c     Musculoskeletal and Integument   Osteoporosis    Chronic, ongoing.  Fosamax stopped > 2 years ago for holiday and will continue off due to jaw and teeth issues with treatment.  She refuses repeat bone density testing.  Is working on chair exercises and being active at home + taking Vitamin D and calcium supplements.      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  No further medications.  Is to return to rheumatology as needed.  At this time is asymptomatic.        Other   Bradycardia    At baseline and asymptomatic. Is very active.  Monitor closely.      PMR (polymyalgia rheumatica) (HCC)    Previously  monitored by rheumatology with last visit in February 2021, symptoms have improved and she is to return as needed only.        Other Visit Diagnoses     Flu vaccine need       Flu vaccine in office today, educated patient.   Relevant Orders   Flu Vaccine Trivalent High Dose (Fluad) (Completed)        Follow up plan: Return in about 6 months (around 04/20/2023) for T2DM, HLD, THYROID, OSTEOPOROSIS.

## 2022-10-20 NOTE — Assessment & Plan Note (Signed)
Chronic, stable, diet-controlled.  A1c 6.8% last visit and previous 7.6% + 7.4% -- trending down with diet changes, she continues to be focused on this and does not want to start medication.  Recheck A1c today. Urine micro 22 April 2022. Recommend continued focus on diet at this time, discussed with her at length benefit of adding on medication -- however she refuses to start at this time.  Due to advanced age more lenient goal recommended, A1C <8 per geriatric society.  Avoid hypoglycemia.  Initiate medication in future if needed, could consider Metformin 500 MG BID.  Return in 6 months.

## 2022-10-20 NOTE — Assessment & Plan Note (Signed)
Chronic, ongoing.  Continue current medication regimen and adjust as needed.  Can consider discontinuation of this if patient wishes to minimize medications in future.  Lipid panel today. 

## 2022-10-20 NOTE — Assessment & Plan Note (Signed)
At baseline and asymptomatic. Is very active.  Monitor closely.

## 2022-10-21 LAB — COMPREHENSIVE METABOLIC PANEL
ALT: 37 [IU]/L — ABNORMAL HIGH (ref 0–32)
AST: 39 [IU]/L (ref 0–40)
Albumin: 4.4 g/dL (ref 3.7–4.7)
Alkaline Phosphatase: 115 [IU]/L (ref 44–121)
BUN/Creatinine Ratio: 18 (ref 12–28)
BUN: 14 mg/dL (ref 8–27)
Bilirubin Total: 0.7 mg/dL (ref 0.0–1.2)
CO2: 16 mmol/L — ABNORMAL LOW (ref 20–29)
Calcium: 9.7 mg/dL (ref 8.7–10.3)
Chloride: 104 mmol/L (ref 96–106)
Creatinine, Ser: 0.76 mg/dL (ref 0.57–1.00)
Globulin, Total: 2.2 g/dL (ref 1.5–4.5)
Glucose: 114 mg/dL — ABNORMAL HIGH (ref 70–99)
Potassium: 5.1 mmol/L (ref 3.5–5.2)
Sodium: 142 mmol/L (ref 134–144)
Total Protein: 6.6 g/dL (ref 6.0–8.5)
eGFR: 75 mL/min/{1.73_m2} (ref >59)

## 2022-10-21 LAB — LIPID PANEL W/O CHOL/HDL RATIO
Cholesterol, Total: 108 mg/dL (ref 100–199)
HDL: 51 mg/dL (ref >39)
LDL Chol Calc (NIH): 41 mg/dL (ref 0–99)
Triglycerides: 77 mg/dL (ref 0–149)
VLDL Cholesterol Cal: 16 mg/dL (ref 5–40)

## 2022-10-21 NOTE — Progress Notes (Signed)
Contacted via MyChart   Good evening Hind, your labs have returned and these are overall stable.  No medication changes.  We will recheck things next visit and try to get and A1c and thyroid labs.  Any questions?  I did not want to put you through anymore pokes yesterday:( Keep being amazing!!  Thank you for allowing me to participate in your care.  I appreciate you. Kindest regards, Courtnay Petrilla

## 2022-11-26 ENCOUNTER — Emergency Department: Payer: Medicare PPO

## 2022-11-26 ENCOUNTER — Telehealth: Payer: Self-pay | Admitting: Family Medicine

## 2022-11-26 ENCOUNTER — Telehealth: Payer: Self-pay | Admitting: Nurse Practitioner

## 2022-11-26 ENCOUNTER — Ambulatory Visit: Payer: Medicare PPO

## 2022-11-26 ENCOUNTER — Ambulatory Visit: Payer: Medicare PPO | Admitting: Family Medicine

## 2022-11-26 ENCOUNTER — Ambulatory Visit: Admission: RE | Admit: 2022-11-26 | Payer: Medicare PPO | Source: Home / Self Care

## 2022-11-26 ENCOUNTER — Ambulatory Visit: Payer: Self-pay | Admitting: *Deleted

## 2022-11-26 ENCOUNTER — Other Ambulatory Visit: Payer: Self-pay

## 2022-11-26 ENCOUNTER — Other Ambulatory Visit
Admission: RE | Admit: 2022-11-26 | Discharge: 2022-11-26 | Disposition: A | Discharge status: Home / Self Care | Payer: Medicare PPO | Source: Home / Self Care | Attending: Family Medicine | Admitting: Family Medicine

## 2022-11-26 VITALS — BP 129/61 | HR 51 | Temp 97.9°F

## 2022-11-26 DIAGNOSIS — Z79899 Other long term (current) drug therapy: Secondary | ICD-10-CM | POA: Diagnosis not present

## 2022-11-26 DIAGNOSIS — Z8249 Family history of ischemic heart disease and other diseases of the circulatory system: Secondary | ICD-10-CM

## 2022-11-26 DIAGNOSIS — G9340 Encephalopathy, unspecified: Secondary | ICD-10-CM | POA: Diagnosis not present

## 2022-11-26 DIAGNOSIS — R3915 Urgency of urination: Secondary | ICD-10-CM | POA: Diagnosis present

## 2022-11-26 DIAGNOSIS — Z880 Allergy status to penicillin: Secondary | ICD-10-CM | POA: Diagnosis not present

## 2022-11-26 DIAGNOSIS — I10 Essential (primary) hypertension: Secondary | ICD-10-CM | POA: Diagnosis present

## 2022-11-26 DIAGNOSIS — Z8616 Personal history of COVID-19: Secondary | ICD-10-CM

## 2022-11-26 DIAGNOSIS — I2489 Other forms of acute ischemic heart disease: Secondary | ICD-10-CM | POA: Diagnosis not present

## 2022-11-26 DIAGNOSIS — E7800 Pure hypercholesterolemia, unspecified: Secondary | ICD-10-CM | POA: Diagnosis present

## 2022-11-26 DIAGNOSIS — R531 Weakness: Secondary | ICD-10-CM | POA: Diagnosis not present

## 2022-11-26 DIAGNOSIS — R4182 Altered mental status, unspecified: Secondary | ICD-10-CM | POA: Diagnosis not present

## 2022-11-26 DIAGNOSIS — R5381 Other malaise: Secondary | ICD-10-CM | POA: Diagnosis not present

## 2022-11-26 DIAGNOSIS — E871 Hypo-osmolality and hyponatremia: Secondary | ICD-10-CM | POA: Diagnosis present

## 2022-11-26 DIAGNOSIS — Z823 Family history of stroke: Secondary | ICD-10-CM

## 2022-11-26 DIAGNOSIS — Z66 Do not resuscitate: Secondary | ICD-10-CM | POA: Diagnosis present

## 2022-11-26 DIAGNOSIS — R2689 Other abnormalities of gait and mobility: Secondary | ICD-10-CM | POA: Diagnosis not present

## 2022-11-26 DIAGNOSIS — N390 Urinary tract infection, site not specified: Secondary | ICD-10-CM | POA: Diagnosis not present

## 2022-11-26 DIAGNOSIS — Z951 Presence of aortocoronary bypass graft: Secondary | ICD-10-CM | POA: Diagnosis not present

## 2022-11-26 DIAGNOSIS — I2510 Atherosclerotic heart disease of native coronary artery without angina pectoris: Secondary | ICD-10-CM | POA: Diagnosis present

## 2022-11-26 DIAGNOSIS — D6959 Other secondary thrombocytopenia: Secondary | ICD-10-CM | POA: Diagnosis present

## 2022-11-26 DIAGNOSIS — G9341 Metabolic encephalopathy: Secondary | ICD-10-CM | POA: Diagnosis present

## 2022-11-26 DIAGNOSIS — M948X9 Other specified disorders of cartilage, unspecified sites: Secondary | ICD-10-CM | POA: Diagnosis not present

## 2022-11-26 DIAGNOSIS — R350 Frequency of micturition: Secondary | ICD-10-CM

## 2022-11-26 DIAGNOSIS — R918 Other nonspecific abnormal finding of lung field: Secondary | ICD-10-CM | POA: Diagnosis not present

## 2022-11-26 DIAGNOSIS — J189 Pneumonia, unspecified organism: Secondary | ICD-10-CM | POA: Diagnosis not present

## 2022-11-26 DIAGNOSIS — Z882 Allergy status to sulfonamides status: Secondary | ICD-10-CM | POA: Diagnosis not present

## 2022-11-26 DIAGNOSIS — E119 Type 2 diabetes mellitus without complications: Secondary | ICD-10-CM | POA: Diagnosis present

## 2022-11-26 DIAGNOSIS — E039 Hypothyroidism, unspecified: Secondary | ICD-10-CM | POA: Diagnosis present

## 2022-11-26 DIAGNOSIS — U071 COVID-19: Principal | ICD-10-CM | POA: Diagnosis present

## 2022-11-26 DIAGNOSIS — R059 Cough, unspecified: Secondary | ICD-10-CM | POA: Diagnosis not present

## 2022-11-26 DIAGNOSIS — Z79890 Hormone replacement therapy (postmenopausal): Secondary | ICD-10-CM

## 2022-11-26 DIAGNOSIS — W19XXXA Unspecified fall, initial encounter: Secondary | ICD-10-CM | POA: Diagnosis not present

## 2022-11-26 DIAGNOSIS — I5A Non-ischemic myocardial injury (non-traumatic): Secondary | ICD-10-CM | POA: Diagnosis not present

## 2022-11-26 DIAGNOSIS — R300 Dysuria: Secondary | ICD-10-CM | POA: Diagnosis not present

## 2022-11-26 LAB — CBC WITH DIFFERENTIAL/PLATELET
Abs Immature Granulocytes: 0.02 10*3/uL (ref 0.00–0.07)
Abs Immature Granulocytes: 0.02 10*3/uL (ref 0.00–0.07)
Basophils Absolute: 0 10*3/uL (ref 0.0–0.1)
Basophils Absolute: 0 10*3/uL (ref 0.0–0.1)
Basophils Relative: 0 %
Basophils Relative: 0 %
Eosinophils Absolute: 0 10*3/uL (ref 0.0–0.5)
Eosinophils Absolute: 0 10*3/uL (ref 0.0–0.5)
Eosinophils Relative: 0 %
Eosinophils Relative: 0 %
HCT: 41.6 % (ref 36.0–46.0)
HCT: 41.2 % (ref 36.0–46.0)
Hemoglobin: 13.7 g/dL (ref 12.0–15.0)
Hemoglobin: 13.6 g/dL (ref 12.0–15.0)
Immature Granulocytes: 0 %
Immature Granulocytes: 0 %
Lymphocytes Relative: 23 %
Lymphocytes Relative: 12 %
Lymphs Abs: 1.4 10*3/uL (ref 0.7–4.0)
Lymphs Abs: 1 10*3/uL (ref 0.7–4.0)
MCH: 31.2 pg (ref 26.0–34.0)
MCH: 30.4 pg (ref 26.0–34.0)
MCHC: 32.9 g/dL (ref 30.0–36.0)
MCHC: 33 g/dL (ref 30.0–36.0)
MCV: 94.8 fL (ref 80.0–100.0)
MCV: 92 fL (ref 80.0–100.0)
Monocytes Absolute: 0.8 10*3/uL (ref 0.1–1.0)
Monocytes Absolute: 1.1 10*3/uL — ABNORMAL HIGH (ref 0.1–1.0)
Monocytes Relative: 13 %
Monocytes Relative: 13 %
Neutro Abs: 3.8 10*3/uL (ref 1.7–7.7)
Neutro Abs: 6.2 10*3/uL (ref 1.7–7.7)
Neutrophils Relative %: 64 %
Neutrophils Relative %: 75 %
Platelets: 130 10*3/uL — ABNORMAL LOW (ref 150–400)
Platelets: 127 10*3/uL — ABNORMAL LOW (ref 150–400)
RBC: 4.39 MIL/uL (ref 3.87–5.11)
RBC: 4.48 MIL/uL (ref 3.87–5.11)
RDW: 13.3 % (ref 11.5–15.5)
RDW: 13.4 % (ref 11.5–15.5)
WBC: 6 10*3/uL (ref 4.0–10.5)
WBC: 8.4 10*3/uL (ref 4.0–10.5)
nRBC: 0 % (ref 0.0–0.2)
nRBC: 0 % (ref 0.0–0.2)

## 2022-11-26 LAB — URINALYSIS, ROUTINE W REFLEX MICROSCOPIC
Bilirubin, UA: NEGATIVE
Glucose, UA: NEGATIVE
Ketones, UA: NEGATIVE
Leukocytes,UA: NEGATIVE
Nitrite, UA: NEGATIVE
Protein,UA: NEGATIVE
RBC, UA: NEGATIVE
Specific Gravity, UA: 1.025 (ref 1.005–1.030)
Urobilinogen, Ur: 1 mg/dL (ref 0.2–1.0)
pH, UA: 5.5 (ref 5.0–7.5)

## 2022-11-26 LAB — COMPREHENSIVE METABOLIC PANEL
ALT: 37 U/L (ref 0–44)
AST: 39 U/L (ref 15–41)
Albumin: 3.8 g/dL (ref 3.5–5.0)
Alkaline Phosphatase: 84 U/L (ref 38–126)
Anion gap: 6 (ref 5–15)
BUN: 16 mg/dL (ref 8–23)
CO2: 24 mmol/L (ref 22–32)
Calcium: 8.9 mg/dL (ref 8.9–10.3)
Chloride: 103 mmol/L (ref 98–111)
Creatinine, Ser: 0.83 mg/dL (ref 0.44–1.00)
GFR, Estimated: >60 mL/min (ref >60)
Glucose, Bld: 144 mg/dL — ABNORMAL HIGH (ref 70–99)
Potassium: 4 mmol/L (ref 3.5–5.1)
Sodium: 133 mmol/L — ABNORMAL LOW (ref 135–145)
Total Bilirubin: 0.7 mg/dL (ref <1.2)
Total Protein: 6.7 g/dL (ref 6.5–8.1)

## 2022-11-26 LAB — RESP PANEL BY RT-PCR (RSV, FLU A&B, COVID)  RVPGX2
Influenza A by PCR: NEGATIVE
Influenza B by PCR: NEGATIVE
Resp Syncytial Virus by PCR: NEGATIVE
SARS Coronavirus 2 by RT PCR: POSITIVE — AB

## 2022-11-26 LAB — CBG MONITORING, ED: Glucose-Capillary: 129 mg/dL — ABNORMAL HIGH (ref 70–99)

## 2022-11-26 LAB — T4, FREE: Free T4: 1.12 ng/dL (ref 0.61–1.12)

## 2022-11-26 LAB — TROPONIN I (HIGH SENSITIVITY): Troponin I (High Sensitivity): 19 ng/L — ABNORMAL HIGH (ref <18)

## 2022-11-26 LAB — TSH: TSH: 0.02 u[IU]/mL — ABNORMAL LOW (ref 0.350–4.500)

## 2022-11-26 NOTE — ED Triage Notes (Signed)
Pt and family report that pt has been weaker than normal the last few days. Report a fall yesterday where pt hit her head. Pt was seen at pcp and a outpatient CT was ordered, but not yet completed. Pt was also tested for UTI and had blood work done at [facility]. Family reports continued weakness and decreased activity. Family also reports concern for pt being confused which is not her baseline. Pt endorses feeling weak and tired. Pt appears weak in triage but is alert and oriented, answering questions and following commands. Pt is breathing unlabored with symmetric chest rise and fall. Pt is not on daily thinners. Pupils are equal and reactive. Pt does endorse a sore throat x 1 wk and a dry cough.

## 2022-11-26 NOTE — Progress Notes (Addendum)
BP 129/61   Pulse (!) 51   Temp 97.9 F (36.6 C) (Oral)   LMP  (LMP Unknown)   SpO2 94%    Subjective:    Patient ID: Kirsten Riley, female    DOB: [birth year], 89 y.o.   MRN: [id number]  HPI: Kirsten Riley is a 89 y.o. female  Chief Complaint  Patient presents with   Dysuria   Fall   Patient experienced a unwitnessed fall yesterday and hit head on chair while trying to catch herself, daughter had to assist with getting up. Denies loss of consciousness and no bruising.   Daughter and son at bedside with complaints of patient experiencing increased confusion over the past two weeks. Complains of 2 weeks ago patient was driving from her daughters house and got lost on how to get home with confusion about where she was, patient states she eventually found her way home. Denies this as happening before.   Also complains of an episode at 1:30 AM, 2 days ago, she started to cook a [place], states she is having Thanksgiving dinner early. Although she is having Thanksgiving dinner early children believe the time of day she chose to do this was abnormal for her. Patiens daughter also believes her staying up all night could have contributed to patient being exhausted, weak, and contributing to the fall.  She admits to continuing to complete her ADL's without an issue and could do better with water intake.   URINARY SYMPTOMS Dysuria: no Urinary frequency: yes Urgency: yes Small volume voids: no Symptom severity: no Urinary incontinence: yes everyday, overnight wearing brief at night Foul odor: yes Hematuria: no Abdominal pain: no Back pain: no Suprapubic pain/pressure: no Flank pain: no Fever:  no Vomiting: no Status: better/worse/stable Previous urinary tract infection: no none    Relevant past medical, surgical, family and social history reviewed and updated as indicated. Interim medical history since our last visit reviewed. Allergies and medications reviewed and updated.  Review  of Systems  Constitutional:  Negative for chills and fever.  Respiratory: Negative.    Cardiovascular: Negative.   Gastrointestinal:  Negative for vomiting.  Genitourinary:  Positive for frequency and urgency. Negative for decreased urine volume, difficulty urinating, dyspareunia, dysuria, flank pain, hematuria and pelvic pain.  Musculoskeletal:  Negative for back pain.  Neurological:  Negative for dizziness and headaches.    Per HPI unless specifically indicated above     Objective:    BP 129/61   Pulse (!) 51   Temp 97.9 F (36.6 C) (Oral)   LMP  (LMP Unknown)   SpO2 94%   Wt Readings from Last 3 Encounters:  10/20/22 96 lb 9.6 oz (43.8 kg)  07/07/22 99 lb (44.9 kg)  06/18/22 99 lb 9.6 oz (45.2 kg)    Physical Exam Vitals and nursing note reviewed.  Constitutional:      General: She is awake. She is not in acute distress.    Appearance: Normal appearance. She is well-developed and well-groomed. She is not ill-appearing.  HENT:     Head: Normocephalic and atraumatic.     Right Ear: Hearing and external ear normal. No drainage.     Left Ear: Hearing and external ear normal. No drainage.     Nose: Nose normal.  Eyes:     General: Lids are normal.        Right eye: No discharge.        Left eye: No discharge.     Extraocular Movements:  Right eye: Abnormal extraocular motion present.     Left eye: Abnormal extraocular motion present.     Conjunctiva/sclera: Conjunctivae normal.  Cardiovascular:     Rate and Rhythm: Regular rhythm. Bradycardia present.     Pulses:          Radial pulses are 2+ on the right side and 2+ on the left side.       Posterior tibial pulses are 2+ on the right side and 2+ on the left side.     Heart sounds: Normal heart sounds, S1 normal and S2 normal. No murmur heard.    No gallop.  Pulmonary:     Effort: Pulmonary effort is normal. No accessory muscle usage or respiratory distress.     Breath sounds: Normal breath sounds.  Abdominal:      Tenderness: There is abdominal tenderness in the epigastric area. There is right CVA tenderness. There is no left CVA tenderness or guarding.  Musculoskeletal:        General: Normal range of motion.     Cervical back: Full passive range of motion without pain and normal range of motion.     Right lower leg: No edema.     Left lower leg: No edema.  Skin:    General: Skin is warm and dry.     Capillary Refill: Capillary refill takes less than 2 seconds.  Neurological:     Mental Status: She is alert and oriented to person, place, and time.     Cranial Nerves: No cranial nerve deficit.     Sensory: Sensation is intact.     Motor: Weakness present.     Gait: Gait abnormal.  Psychiatric:        Attention and Perception: Attention normal.        Mood and Affect: Mood normal.        Speech: Speech normal.        Behavior: Behavior normal. Behavior is cooperative.        Thought Content: Thought content normal.     Results for orders placed or performed in visit on 11/26/22  Urinalysis, Routine w reflex microscopic  Result Value Ref Range   Specific Gravity, UA 1.025 1.005 - 1.030   pH, UA 5.5 5.0 - 7.5   Color, UA Yellow Yellow   Appearance Ur Clear Clear   Leukocytes,UA Negative Negative   Protein,UA Negative Negative/Trace   Glucose, UA Negative Negative   Ketones, UA Negative Negative   RBC, UA Negative Negative   Bilirubin, UA Negative Negative   Urobilinogen, Ur 1.0 0.2 - 1.0 mg/dL   Nitrite, UA Negative Negative   Microscopic Examination Comment       Assessment & Plan:   Problem List Items Addressed This Visit     Fall    Acute, occurred yesterday, hit head, denies loss of consciousness. Will order STAT CT of head to rule out bleed. CMP, CBC, thyroid levels done.       Relevant Orders   CT HEAD WO CONTRAST ( )   DG Chest 2 View   Other Visit Diagnoses     Urinary frequency    -  Primary   Acute, ongoing. UA negative, will send for culture. Previous A1C  6.8% 6 months ago.   Relevant Orders   Urinalysis, Routine w reflex microscopic (Completed)   Urine Culture   Poor balance       Acute, new onset. Will order STAT chest xray along with STAT CT of head  to determine underlying cause. CMP, CBC, thyroid levels today.   Relevant Orders   CT HEAD WO CONTRAST ( )   DG Chest 2 View   Comp Met (CMET)   TSH + free T4   CBC w/Diff (Completed)        Follow up plan: Return in 1 week (on 12/03/2022) for Follow up.

## 2022-11-26 NOTE — Assessment & Plan Note (Addendum)
Acute, occurred yesterday, hit head, denies loss of consciousness. Will order STAT CT of head to rule out bleed. CMP, CBC, thyroid levels done.

## 2022-11-26 NOTE — Telephone Encounter (Signed)
Copied from CRM [id number]. Topic: Appointment Scheduling - Scheduling Inquiry for Clinic >> Nov 26, 2022  1:31 PM Franchot Heidelberg wrote: Reason for CRM: Marcelino Duster called from [facility] imaging stating that the patient is currently there and is missing an appt for her CT scan.  It is a STAT CT  [place] is asking for [place] to contact the patient's son for scheduling. Best contact: Jillyn Hidden [phone]  She believes the CT needs an authorization with [facility]

## 2022-11-26 NOTE — Telephone Encounter (Signed)
  Chief Complaint: pt daughter on DPR reports patient with darker colored urine, strong odor, confused and balance off  Symptoms: noted confusion , balance off, urine odor smells like "ammonia".  Frequency: unknown Pertinent Negatives: Patient denies fever , less confusion now after drinking extra water Disposition: [] ED /[] Urgent Care (no appt availability in office) / [x] Appointment(In office/virtual)/ []  [facility]/ [] Home Care/ [] Refused Recommended Disposition /[] [facility]/ []  Follow-up with PCP Additional Notes:   No appt with PCP. Scheduled with another provider in office today .  Pt daughter requesting if ok to get urine cup and obtain specimen at home not to have to bring patient in office. Reinforced need for evaluation due to multiple sx. Patient daughter requesting no blood work due to patient hard stick.    Reason for Disposition  Age > 50 years  Answer Assessment - Initial Assessment Questions 1. SEVERITY: "How bad is the pain?"  (e.g., Scale 1-10; mild, moderate, or severe)   - MILD (1-3): complains slightly about urination hurting   - MODERATE (4-7): interferes with normal activities     - SEVERE (8-10): excruciating, unwilling or unable to urinate because of the pain      No pain  2. FREQUENCY: "How many times have you had painful urination today?"      Na 3. PATTERN: "Is pain present every time you urinate or just sometimes?"      No  4. ONSET: "When did the painful urination start?"      unknown 5. FEVER: "Do you have a fever?" If Yes, ask: "What is your temperature, how was it measured, and when did it start?"     na 6. PAST UTI: "Have you had a urine infection before?" If Yes, ask: "When was the last time?" and "What happened that time?"      No  7. CAUSE: "What do you think is causing the painful urination?"  (e.g., UTI, scratch, Herpes sore)     Na  8. OTHER SYMPTOMS: "Do you have any other symptoms?" (e.g., blood in urine, flank pain,  genital sores, urgency, vaginal discharge)     Darker yellow, odor , smells like ammonia, confusion, and balance off   9. PREGNANCY: "Is there any chance you are pregnant?" "When was your last menstrual period?"     na  Protocols used: Urination Pain - Female-A-AH

## 2022-11-26 NOTE — Telephone Encounter (Signed)
FYI....the patient has an appointment today with Rashelle @ 11:00 am.

## 2022-11-26 NOTE — Telephone Encounter (Signed)
I don't know what's going on here, but can we please find out?

## 2022-11-26 NOTE — Telephone Encounter (Signed)
Patients son called wanted to see if provider can call [facility] to verify that the order for he CT Scan was in the system as they are waiting to go back up there for the scan. Please f/u with son

## 2022-11-26 NOTE — ED Triage Notes (Signed)
First Nurse Note:  Pt via [facility] from home. Pt was seen at her PCP today, pt c/o cough, fatigue, and UTI symptoms. States pt has been altered last night but EMS reports A&Ox4 and able to answer questions.  134/72 BP  98% on RA 90 HR  219 CBG  16 RR

## 2022-11-27 ENCOUNTER — Inpatient Hospital Stay
Admission: EM | Admit: 2022-11-27 | Discharge: 2022-11-28 | DRG: 177 | Disposition: A | Discharge status: Home Health | Payer: Medicare PPO | Attending: Internal Medicine | Admitting: Internal Medicine

## 2022-11-27 ENCOUNTER — Telehealth: Payer: Self-pay | Admitting: Nurse Practitioner

## 2022-11-27 ENCOUNTER — Encounter: Payer: Self-pay | Admitting: Family Medicine

## 2022-11-27 ENCOUNTER — Emergency Department: Payer: Medicare PPO

## 2022-11-27 ENCOUNTER — Ambulatory Visit: Payer: Medicare PPO

## 2022-11-27 DIAGNOSIS — N390 Urinary tract infection, site not specified: Secondary | ICD-10-CM | POA: Diagnosis not present

## 2022-11-27 DIAGNOSIS — Z8616 Personal history of COVID-19: Secondary | ICD-10-CM | POA: Diagnosis not present

## 2022-11-27 DIAGNOSIS — U071 COVID-19: Secondary | ICD-10-CM

## 2022-11-27 DIAGNOSIS — E871 Hypo-osmolality and hyponatremia: Secondary | ICD-10-CM | POA: Insufficient documentation

## 2022-11-27 DIAGNOSIS — G9340 Encephalopathy, unspecified: Secondary | ICD-10-CM | POA: Diagnosis not present

## 2022-11-27 DIAGNOSIS — R531 Weakness: Principal | ICD-10-CM

## 2022-11-27 DIAGNOSIS — Z79899 Other long term (current) drug therapy: Secondary | ICD-10-CM | POA: Diagnosis not present

## 2022-11-27 DIAGNOSIS — Z880 Allergy status to penicillin: Secondary | ICD-10-CM | POA: Diagnosis not present

## 2022-11-27 DIAGNOSIS — Z951 Presence of aortocoronary bypass graft: Secondary | ICD-10-CM | POA: Diagnosis not present

## 2022-11-27 DIAGNOSIS — Z823 Family history of stroke: Secondary | ICD-10-CM | POA: Diagnosis not present

## 2022-11-27 DIAGNOSIS — E039 Hypothyroidism, unspecified: Secondary | ICD-10-CM | POA: Insufficient documentation

## 2022-11-27 DIAGNOSIS — I5A Non-ischemic myocardial injury (non-traumatic): Secondary | ICD-10-CM | POA: Insufficient documentation

## 2022-11-27 DIAGNOSIS — J189 Pneumonia, unspecified organism: Secondary | ICD-10-CM

## 2022-11-27 DIAGNOSIS — G9341 Metabolic encephalopathy: Secondary | ICD-10-CM | POA: Diagnosis present

## 2022-11-27 DIAGNOSIS — Z8249 Family history of ischemic heart disease and other diseases of the circulatory system: Secondary | ICD-10-CM | POA: Diagnosis not present

## 2022-11-27 DIAGNOSIS — R3915 Urgency of urination: Secondary | ICD-10-CM | POA: Diagnosis present

## 2022-11-27 DIAGNOSIS — D6959 Other secondary thrombocytopenia: Secondary | ICD-10-CM | POA: Diagnosis present

## 2022-11-27 DIAGNOSIS — I2489 Other forms of acute ischemic heart disease: Secondary | ICD-10-CM | POA: Diagnosis not present

## 2022-11-27 DIAGNOSIS — Z66 Do not resuscitate: Secondary | ICD-10-CM | POA: Diagnosis present

## 2022-11-27 DIAGNOSIS — I2510 Atherosclerotic heart disease of native coronary artery without angina pectoris: Secondary | ICD-10-CM | POA: Diagnosis present

## 2022-11-27 DIAGNOSIS — Z79890 Hormone replacement therapy (postmenopausal): Secondary | ICD-10-CM | POA: Diagnosis not present

## 2022-11-27 DIAGNOSIS — E785 Hyperlipidemia, unspecified: Secondary | ICD-10-CM | POA: Insufficient documentation

## 2022-11-27 DIAGNOSIS — M948X9 Other specified disorders of cartilage, unspecified sites: Secondary | ICD-10-CM | POA: Diagnosis not present

## 2022-11-27 DIAGNOSIS — E119 Type 2 diabetes mellitus without complications: Secondary | ICD-10-CM | POA: Diagnosis present

## 2022-11-27 DIAGNOSIS — R4182 Altered mental status, unspecified: Secondary | ICD-10-CM

## 2022-11-27 DIAGNOSIS — I10 Essential (primary) hypertension: Secondary | ICD-10-CM | POA: Diagnosis present

## 2022-11-27 DIAGNOSIS — Z882 Allergy status to sulfonamides status: Secondary | ICD-10-CM | POA: Diagnosis not present

## 2022-11-27 DIAGNOSIS — R059 Cough, unspecified: Secondary | ICD-10-CM | POA: Diagnosis not present

## 2022-11-27 DIAGNOSIS — E7800 Pure hypercholesterolemia, unspecified: Secondary | ICD-10-CM | POA: Diagnosis present

## 2022-11-27 DIAGNOSIS — D696 Thrombocytopenia, unspecified: Secondary | ICD-10-CM | POA: Insufficient documentation

## 2022-11-27 LAB — URINALYSIS, ROUTINE W REFLEX MICROSCOPIC
Bilirubin Urine: NEGATIVE
Glucose, UA: NEGATIVE mg/dL
Hgb urine dipstick: NEGATIVE
Ketones, ur: 5 mg/dL — AB
Nitrite: NEGATIVE
Protein, ur: NEGATIVE mg/dL
Specific Gravity, Urine: 1.019 (ref 1.005–1.030)
pH: 5 (ref 5.0–8.0)

## 2022-11-27 LAB — BASIC METABOLIC PANEL
Anion gap: 8 (ref 5–15)
BUN: 13 mg/dL (ref 8–23)
CO2: 22 mmol/L (ref 22–32)
Calcium: 8.1 mg/dL — ABNORMAL LOW (ref 8.9–10.3)
Chloride: 107 mmol/L (ref 98–111)
Creatinine, Ser: 0.71 mg/dL (ref 0.44–1.00)
GFR, Estimated: >60 mL/min (ref >60)
Glucose, Bld: 190 mg/dL — ABNORMAL HIGH (ref 70–99)
Potassium: 3.8 mmol/L (ref 3.5–5.1)
Sodium: 137 mmol/L (ref 135–145)

## 2022-11-27 LAB — CBC
HCT: 36.6 % (ref 36.0–46.0)
Hemoglobin: 11.9 g/dL — ABNORMAL LOW (ref 12.0–15.0)
MCH: 30.3 pg (ref 26.0–34.0)
MCHC: 32.5 g/dL (ref 30.0–36.0)
MCV: 93.1 fL (ref 80.0–100.0)
Platelets: 111 10*3/uL — ABNORMAL LOW (ref 150–400)
RBC: 3.93 MIL/uL (ref 3.87–5.11)
RDW: 13.3 % (ref 11.5–15.5)
WBC: 6.1 10*3/uL (ref 4.0–10.5)
nRBC: 0 % (ref 0.0–0.2)

## 2022-11-27 LAB — TSH: TSH: 0.032 u[IU]/mL — ABNORMAL LOW (ref 0.350–4.500)

## 2022-11-27 LAB — TROPONIN I (HIGH SENSITIVITY): Troponin I (High Sensitivity): 23 ng/L — ABNORMAL HIGH (ref <18)

## 2022-11-27 LAB — LACTIC ACID, PLASMA
Lactic Acid, Venous: 1.1 mmol/L (ref 0.5–1.9)
Lactic Acid, Venous: 1.5 mmol/L (ref 0.5–1.9)

## 2022-11-27 MED ORDER — IPRATROPIUM-ALBUTEROL 0.5-2.5 (3) MG/3ML IN SOLN
3.0000 mL | Freq: Four times a day (QID) | RESPIRATORY_TRACT | Status: DC
  Administered 2022-11-27 (×3): 3 mL via RESPIRATORY_TRACT
  Filled 2022-11-27 (×4): qty 3

## 2022-11-27 MED ORDER — SODIUM CHLORIDE 0.9 % IV SOLN: INTRAVENOUS | Status: DC

## 2022-11-27 MED ORDER — DEXTROSE 5 % IV SOLN
500.0000 mg | Freq: Once | INTRAVENOUS | Status: AC
  Administered 2022-11-27: 500 mg via INTRAVENOUS
  Filled 2022-11-27: qty 5

## 2022-11-27 MED ORDER — LEVOTHYROXINE SODIUM 50 MCG PO TABS
75.0000 ug | ORAL_TABLET | Freq: Every day | ORAL | Status: DC
  Administered 2022-11-27 – 2022-11-28 (×2): 75 ug via ORAL
  Filled 2022-11-27: qty 1
  Filled 2022-11-27: qty 2

## 2022-11-27 MED ORDER — CHLORHEXIDINE GLUCONATE 0.12 % MT SOLN
5.0000 mL | Freq: Two times a day (BID) | OROMUCOSAL | Status: DC
  Administered 2022-11-27 – 2022-11-28 (×2): 5 mL via OROMUCOSAL
  Filled 2022-11-27 (×2): qty 15

## 2022-11-27 MED ORDER — ACETAMINOPHEN 325 MG PO TABS: 650.0000 mg | ORAL_TABLET | Freq: Four times a day (QID) | ORAL | Status: DC | PRN

## 2022-11-27 MED ORDER — VITAMIN D 25 MCG (1000 UNIT) PO TABS
2000.0000 [IU] | ORAL_TABLET | Freq: Every day | ORAL | Status: DC
  Administered 2022-11-27 – 2022-11-28 (×2): 2000 [IU] via ORAL
  Filled 2022-11-27 (×2): qty 2

## 2022-11-27 MED ORDER — ACETAMINOPHEN 650 MG RE SUPP: 650.0000 mg | Freq: Four times a day (QID) | RECTAL | Status: DC | PRN

## 2022-11-27 MED ORDER — PRAVASTATIN SODIUM 20 MG PO TABS
40.0000 mg | ORAL_TABLET | Freq: Every day | ORAL | Status: DC
  Administered 2022-11-27: 40 mg via ORAL
  Filled 2022-11-27: qty 2

## 2022-11-27 MED ORDER — ENOXAPARIN SODIUM 30 MG/0.3ML IJ SOSY
30.0000 mg | PREFILLED_SYRINGE | INTRAMUSCULAR | Status: DC
  Filled 2022-11-27 (×2): qty 0.3

## 2022-11-27 MED ORDER — MAGNESIUM HYDROXIDE 400 MG/5ML PO SUSP: 30.0000 mL | Freq: Every day | ORAL | Status: DC | PRN

## 2022-11-27 MED ORDER — SODIUM CHLORIDE 0.9 % IV SOLN
1.0000 g | Freq: Once | INTRAVENOUS | Status: AC
  Administered 2022-11-27: 1 g via INTRAVENOUS
  Filled 2022-11-27: qty 10

## 2022-11-27 MED ORDER — SODIUM CHLORIDE 0.9 % IV SOLN: 2.0000 g | INTRAVENOUS | Status: DC

## 2022-11-27 MED ORDER — DEXTROSE 5 % IV SOLN: 500.0000 mg | INTRAVENOUS | Status: DC

## 2022-11-27 MED ORDER — OMEGA-3-ACID ETHYL ESTERS 1 G PO CAPS
1.0000 g | ORAL_CAPSULE | Freq: Every day | ORAL | Status: DC
  Administered 2022-11-28: 1 g via ORAL
  Filled 2022-11-27 (×2): qty 1

## 2022-11-27 MED ORDER — TRAZODONE HCL 50 MG PO TABS: 25.0000 mg | ORAL_TABLET | Freq: Every evening | ORAL | Status: DC | PRN

## 2022-11-27 MED ORDER — GUAIFENESIN ER 600 MG PO TB12
600.0000 mg | ORAL_TABLET | Freq: Two times a day (BID) | ORAL | Status: DC
  Administered 2022-11-27 – 2022-11-28 (×3): 600 mg via ORAL
  Filled 2022-11-27 (×3): qty 1

## 2022-11-27 MED ORDER — SODIUM CHLORIDE 0.9 % IV BOLUS
1000.0000 mL | Freq: Once | INTRAVENOUS | Status: AC
  Administered 2022-11-27: 1000 mL via INTRAVENOUS

## 2022-11-27 MED ORDER — ONDANSETRON HCL 4 MG PO TABS: 4.0000 mg | ORAL_TABLET | Freq: Four times a day (QID) | ORAL | Status: DC | PRN

## 2022-11-27 MED ORDER — HYDROCOD POLI-CHLORPHE POLI ER 10-8 MG/5ML PO SUER
5.0000 mL | Freq: Two times a day (BID) | ORAL | Status: DC | PRN
  Administered 2022-11-27 – 2022-11-28 (×2): 5 mL via ORAL
  Filled 2022-11-27 (×2): qty 5

## 2022-11-27 MED ORDER — IPRATROPIUM-ALBUTEROL 0.5-2.5 (3) MG/3ML IN SOLN: 3.0000 mL | RESPIRATORY_TRACT | Status: DC | PRN

## 2022-11-27 MED ORDER — IPRATROPIUM-ALBUTEROL 0.5-2.5 (3) MG/3ML IN SOLN: 3.0000 mL | Freq: Four times a day (QID) | RESPIRATORY_TRACT | Status: DC

## 2022-11-27 MED ORDER — ONDANSETRON HCL 4 MG/2ML IJ SOLN: 4.0000 mg | Freq: Four times a day (QID) | INTRAMUSCULAR | Status: DC | PRN

## 2022-11-27 NOTE — H&P (Signed)
[place]   PATIENT NAME: Kirsten Riley    MR#:  [id number]  DATE OF BIRTH:  07-01-1931  DATE OF ADMISSION:  11/27/2022  PRIMARY CARE PHYSICIAN: Aura Dials T, NP   Patient is coming from: Home  REQUESTING/REFERRING PHYSICIAN: Chiquita Loth, MD  CHIEF COMPLAINT:  Lethargy  HISTORY OF PRESENT ILLNESS:  Kirsten Riley is a 89 y.o. female with medical history significant for coronary artery disease, type 2 diabetes mellitus, hypertension and dyslipidemia as well as hypothyroidism and PMR, who presented to the emergency room with acute onset of lethargy and generalized weakness, fatigue and cough with altered mental status and concern for UTI.  The patient has been having urinary frequency and urgency without dysuria or hematuria or flank pain.  She admits to chills without measured fever.  No nausea or vomiting or abdominal pain.  No chest pain or palpitations.  No headache or dizziness or blurred vision.  She was seen by her PCP for symptoms.  Her PCP ordered an outpatient noncontrasted head CT scan that was pending authorization by her insurance.  The patient had COVID-19 a couple weeks ago.  ED Course: When she came to the ER heart rate was 51 with otherwise normal vital signs.  Labs revealed mild hyponatremia and a blood Kos of 144 with otherwise unremarkable CMP.  CBC with within normal.  UA was positive for UTI.  Blood cultures were drawn.  COVID-19 came back positive and influenza and RSV PCR's came back negative. EKG as reviewed by me : Sinus bradycardia with a rate of 58 with low voltage QRS. Imaging: Portable chest x-ray showed suspected cavitary lesions in the medial left great lung bases versus unusual appearances of cartilaginous calcification.  Chest CT without contrast revealed aortic atherosclerosis with no acute abnormalities.  Abnormalities on chest CTA corresponded to nodular calcifications of the costal cartilage.  Noncontrast head CT scan revealed no acute intracranial  normalities.  The patient was given 1 L bolus of IV normal saline, IV Rocephin and Zithromax.  She will be admitted to a medical telemetry bed for further evaluation and management. PAST MEDICAL HISTORY:   Past Medical History:  Diagnosis Date   CAD (coronary artery disease)    Diabetes mellitus (HCC) 12/10/2014   Fractured rib    Hyperlipidemia    Hypertension    Hypothyroidism    Polymyalgia rheumatica (HCC) 06/08/2014    PAST SURGICAL HISTORY:   Past Surgical History:  Procedure Laterality Date   ABDOMINAL HYSTERECTOMY  1978   partial   CORONARY ARTERY BYPASS GRAFT     hypercholesterol     hypothyroid      SOCIAL HISTORY:   Social History   Tobacco Use   Smoking status: Never   Smokeless tobacco: Never  Substance Use Topics   Alcohol use: No    Alcohol/week: 0.0 standard drinks of alcohol    FAMILY HISTORY:   Family History  Problem Relation Age of Onset   Heart disease Mother    Stroke Mother    Hypertension Mother    Cancer Father    Cancer Sister        breast, ovarian, pancreatic   Heart disease Sister        x2   Cancer Brother    Heart disease Brother    Cancer Brother    Heart disease Brother    Cancer Brother    Heart disease Brother     DRUG ALLERGIES:   Allergies  Allergen Reactions   Sulfa Antibiotics Other (See Comments)   Penicillins Rash    REVIEW OF SYSTEMS:   ROS As per history of present illness. All pertinent systems were reviewed above. Constitutional, HEENT, cardiovascular, respiratory, GI, GU, musculoskeletal, neuro, psychiatric, endocrine, integumentary and hematologic systems were reviewed and are otherwise negative/unremarkable except for positive findings mentioned above in the HPI.   MEDICATIONS AT HOME:   Prior to Admission medications   Medication Sig Start Date End Date Taking? Authorizing Provider  chlorhexidine (PERIDEX) 0.12 % solution SMARTSIG:By Mouth 03/27/21   [provider]  cholecalciferol  (VITAMIN D3) 25 MCG (1000 UNIT) tablet Take 2,000 Units by mouth daily. Take once a day    [provider]  levothyroxine (SYNTHROID) 75 MCG tablet Take 1 tablet (75 mcg total) by mouth daily before breakfast. 06/18/22   Cannady, Corrie Dandy T, NP  lovastatin (MEVACOR) 40 MG tablet Take 1 tablet (40 mg total) by mouth at bedtime. 10/20/22   Cannady, Corrie Dandy T, NP  Omega-3 Fatty Acids (FISH OIL) 300 MG CAPS Take by mouth.    [provider]      VITAL SIGNS:  Blood pressure (!) 125/42, pulse 63, temperature 99.1 F (37.3 C), temperature source Oral, resp. rate 17, height 4\' 11"  (1.499 m), weight 43.5 kg, SpO2 95%.  PHYSICAL EXAMINATION:  Physical Exam  GENERAL:  89 y.o.-year-old patient lying in the bed with no acute distress.  EYES: Pupils equal, round, reactive to light and accommodation. No scleral icterus. Extraocular muscles intact.  HEENT: Head atraumatic, normocephalic. Oropharynx and nasopharynx clear.  NECK:  Supple, no jugular venous distention. No thyroid enlargement, no tenderness.  LUNGS: Slightly diminished bibasal breath sounds.  No. No use of accessory muscles of respiration.  CARDIOVASCULAR: Regular rate and rhythm, S1, S2 normal. No murmurs, rubs, or gallops.  ABDOMEN: Soft, nondistended, nontender. Bowel sounds present. No organomegaly or mass.  EXTREMITIES: No pedal edema, cyanosis, or clubbing.  NEUROLOGIC: Cranial nerves II through XII are intact. Muscle strength 5/5 in all extremities. Sensation intact. Gait not checked.  PSYCHIATRIC: The patient is alert and ori.ented x 3.  Normal affect and good eye contact. SKIN: No obvious rash, lesion, or ulcer.   LABORATORY PANEL:   CBC Recent Labs  Lab 11/27/22 0502  WBC 6.1  HGB 11.9*  HCT 36.6  PLT 111*   ------------------------------------------------------------------------------------------------------------------  Chemistries  Recent Labs  Lab 11/26/22 2013 11/27/22 0502  NA 133* 137  K 4.0 3.8   CL 103 107  CO2 24 22  GLUCOSE 144* 190*  BUN 16 13  CREATININE 0.83 0.71  CALCIUM 8.9 8.1*  AST 39  --   ALT 37  --   ALKPHOS 84  --   BILITOT 0.7  --    ------------------------------------------------------------------------------------------------------------------  Cardiac Enzymes No results for input(s): "TROPONINI" in the last 168 hours. ------------------------------------------------------------------------------------------------------------------  RADIOLOGY:  CT Chest Wo Contrast  Result Date: 11/27/2022 CLINICAL DATA:  Pneumonia complication suspected. X-ray done. COVID. Eval cavitary lesion seen on x-ray. Cough, fatigue EXAM: CT CHEST WITHOUT CONTRAST TECHNIQUE: Multidetector CT imaging of the chest was performed following the standard protocol without IV contrast. RADIATION DOSE REDUCTION: This exam was performed according to the departmental dose-optimization program which includes automated exposure control, adjustment of the mA and/or kV according to patient size and/or use of iterative reconstruction technique. COMPARISON:  Chest radiograph 11/26/2022 and report from CT chest 12/30/2014 FINDINGS: Cardiovascular: No pericardial effusion. Coronary artery and aortic atherosclerotic calcification. Mediastinum/Nodes: Trachea and esophagus are  unremarkable. No lymphadenopathy Lungs/Pleura: Lungs are clear. No pleural effusion or pneumothorax. Upper Abdomen: No acute abnormality. Musculoskeletal: No fracture is seen. The abnormality seen on radiographs 11/26/2022 corresponds to nodular calcifications in the costal cartilage. IMPRESSION: 1. No acute abnormality in the chest. No cavitary lesion is seen. The abnormality seen on radiographs 11/26/2022 corresponds to nodular calcifications in the costal cartilage. Aortic Atherosclerosis (ICD10-I70.0). Electronically Signed   By: Minerva Fester M.D.   On: 11/27/2022 02:11   DG Chest Portable 1 View  Result Date: 11/26/2022 CLINICAL  DATA:  Weaker than normal EXAM: PORTABLE CHEST 1 VIEW COMPARISON:  06/05/2014 FINDINGS: Post sternotomy changes. Rounded metallic density over the mid to lower mediastinal silhouette presumably surgical in origin. No consolidation or effusion. Question cavitary lesions in the medial left greater than right lung bases versus unusual appearance of cartilaginous calcification. Normal cardiac size. No pneumothorax. IMPRESSION: 1. Question cavitary lesions in the medial left greater than right lung bases versus unusual appearance of cartilaginous calcification. Recommend further evaluation with chest CT. Electronically Signed   By: Jasmine Pang M.D.   On: 11/26/2022 21:41   CT Head Wo Contrast  Result Date: 11/26/2022 CLINICAL DATA:  Altered mental status, cough, fatigue, UTI symptoms EXAM: CT HEAD WITHOUT CONTRAST TECHNIQUE: Contiguous axial images were obtained from the base of the skull through the vertex without intravenous contrast. RADIATION DOSE REDUCTION: This exam was performed according to the departmental dose-optimization program which includes automated exposure control, adjustment of the mA and/or kV according to patient size and/or use of iterative reconstruction technique. COMPARISON:  None Available. FINDINGS: Brain: No evidence of acute infarction, hemorrhage, mass, mass effect, or midline shift. No hydrocephalus or extra-axial fluid collection. Age related cerebral atrophy. Periventricular white matter changes, likely the sequela of chronic small vessel ischemic disease. Vascular: No hyperdense vessel. Atherosclerotic calcifications in the intracranial carotid and vertebral arteries. Skull: Negative for fracture or focal lesion. Sinuses/Orbits: No acute finding. Status post bilateral lens replacements. Other: The mastoid air cells are well aerated. IMPRESSION: No acute intracranial process. Electronically Signed   By: Wiliam Ke M.D.   On: 11/26/2022 19:54      IMPRESSION AND PLAN:   Assessment and Plan: * Acute encephalopathy - This is associated with lethargy that could be related to community-acquired pneumonia and UTI. - She will be admitted to a medical telemetry bed. - Will continue antibiotic therapy with IV Rocephin and Zithromax. - We will follow blood and urine cultures. - Mucolytic therapy will be provided. - Bronchodilator therapy will be provided.  COVID-19 virus infection - This could have led to secondary bacterial infection. - Conservative management will be provided. - Management otherwise of her pneumonia as above.  Type 2 diabetes, diet controlled (HCC) - The patient will be placed on supplemental coverage with NovoLog.  Dyslipidemia - We will continue statin therapy.  Hypothyroidism - We will continue Synthroid and check TSH.       DVT prophylaxis: Lovenox.  Advanced Care Planning:  Code Status: The patient is DNR and DNI. Family Communication:  The plan of care was discussed in details with the patient (and family). I answered all questions. The patient agreed to proceed with the above mentioned plan. Further management will depend upon hospital course. Disposition Plan: Back to previous home environment Consults called: none. All the records are reviewed and case discussed with ED provider.  Status is: Inpatient    At the time of the admission, it appears that the appropriate admission status for  this patient is inpatient.  This is judged to be reasonable and necessary in order to provide the required intensity of service to ensure the patient's safety given the presenting symptoms, physical exam findings and initial radiographic and laboratory data in the context of comorbid conditions.  The patient requires inpatient status due to high intensity of service, high risk of further deterioration and high frequency of surveillance required.  I certify that at the time of admission, it is my clinical judgment that the patient will  require inpatient hospital care extending more than 2 midnights.                            Dispo: The patient is from: Home              Anticipated d/c is to: Home              Patient currently is not medically stable to d/c.              Difficult to place patient: No  Hannah Beat M.D on 11/27/2022 at 9:54 AM  [facility]   From 7 PM-7 AM, contact night-coverage [email or web address]  CC: Primary care physician; Marjie Skiff, NP

## 2022-11-27 NOTE — Telephone Encounter (Signed)
Son Jillyn Hidden calling this am and wants the dr to know pt was admitted last night to the hospital for covid and pna (he thinks).  He states pt was seen in the office yesterday.

## 2022-11-27 NOTE — Assessment & Plan Note (Signed)
-   The patient will be placed on supplemental coverage with NovoLog. 

## 2022-11-27 NOTE — Assessment & Plan Note (Signed)
-   This could have led to secondary bacterial infection. - Conservative management will be provided. - Management otherwise of her pneumonia as above.

## 2022-11-27 NOTE — ED Notes (Signed)
Patient ambulatory to bathroom with one-person assist.

## 2022-11-27 NOTE — Plan of Care (Signed)
  Problem: Education: Goal: Knowledge of risk factors and measures for prevention of condition will improve Outcome: Progressing   Problem: Coping: Goal: Psychosocial and spiritual needs will be supported Outcome: Progressing   Problem: Respiratory: Goal: Will maintain a patent airway Outcome: Progressing Goal: Complications related to the disease process, condition or treatment will be avoided or minimized Outcome: Progressing   Problem: Education: Goal: Knowledge of General Education information will improve Description: Including pain rating scale, medication(s)/side effects and non-pharmacologic comfort measures Outcome: Progressing   Problem: Health Behavior/Discharge Planning: Goal: Ability to manage health-related needs will improve Outcome: Progressing   Problem: Clinical Measurements: Goal: Ability to maintain clinical measurements within normal limits will improve Outcome: Progressing Goal: Will remain free from infection Outcome: Progressing Goal: Diagnostic test results will improve Outcome: Progressing Goal: Respiratory complications will improve Outcome: Progressing Goal: Cardiovascular complication will be avoided Outcome: Progressing   Problem: Activity: Goal: Risk for activity intolerance will decrease Outcome: Progressing   Problem: Nutrition: Goal: Adequate nutrition will be maintained Outcome: Progressing   Problem: Coping: Goal: Level of anxiety will decrease Outcome: Progressing   Problem: Elimination: Goal: Will not experience complications related to bowel motility Outcome: Progressing Goal: Will not experience complications related to urinary retention Outcome: Progressing   Problem: Pain Management: Goal: General experience of comfort will improve Outcome: Progressing   Problem: Safety: Goal: Ability to remain free from injury will improve Outcome: Progressing   Problem: Skin Integrity: Goal: Risk for impaired skin integrity will  decrease Outcome: Progressing   Problem: Activity: Goal: Ability to tolerate increased activity will improve Outcome: Progressing   Problem: Clinical Measurements: Goal: Ability to maintain a body temperature in the normal range will improve Outcome: Progressing   Problem: Respiratory: Goal: Ability to maintain adequate ventilation will improve Outcome: Progressing Goal: Ability to maintain a clear airway will improve Outcome: Progressing   Problem: Coping: Goal: Psychosocial and spiritual needs will be supported Outcome: Progressing   Problem: Respiratory: Goal: Will maintain a patent airway Outcome: Progressing   Problem: Education: Goal: Knowledge of General Education information will improve Description: Including pain rating scale, medication(s)/side effects and non-pharmacologic comfort measures Outcome: Progressing   Problem: Health Behavior/Discharge Planning: Goal: Ability to manage health-related needs will improve Outcome: Progressing

## 2022-11-27 NOTE — Assessment & Plan Note (Signed)
-  We will continue Synthroid and check TSH.

## 2022-11-27 NOTE — ED Provider Notes (Signed)
[facility] Provider Note    Event Date/Time   First MD Initiated Contact with Patient 11/27/22 0148     (approximate)   History   Lethargy   HPI  Level V caveat: Limited by confusion  Kirsten Riley is a 89 y.o. female brought to the ED via EMS from home with a chief complaint of generalized weakness, fatigue, cough, UTI symptoms and altered mental status.  Family brought her to see her PCP for the above symptoms.  PCP ordered an outpatient CT head which was pending authorization by her insurance.  Patient subsequently became lethargic and confused per family so PCP referred her to the ED for further evaluation.  Endorses cough.  Denies chest pain, shortness of breath, abdominal pain, nausea, vomiting or dizziness.     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   PMR (polymyalgia rheumatica) (HCC) 04/08/2019   Do not resuscitate 10/19/2017   Osteoporosis 04/19/2017   Counseling regarding advanced directives and goals of care 04/15/2016   Rheumatoid arthritis 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 Authorizing Provider  chlorhexidine (PERIDEX) 0.12 % solution SMARTSIG:By Mouth 03/27/21   [provider]  cholecalciferol (VITAMIN D3) 25 MCG (1000 UNIT) tablet Take 2,000 Units by mouth daily. Take once a day    [provider]  levothyroxine (SYNTHROID) 75 MCG tablet Take 1 tablet (75 mcg total) by mouth daily before breakfast. 06/18/22   Cannady, Corrie Dandy T, NP  lovastatin (MEVACOR) 40 MG tablet Take 1 tablet (40 mg total) by mouth at bedtime. 10/20/22   Cannady, Corrie Dandy T, NP  Omega-3 Fatty Acids (FISH OIL) 300 MG CAPS Take by mouth.    [provider]     Allergies  Sulfa antibiotics and Penicillins   Family History   Family History  Problem Relation Age of Onset   Heart disease Mother    Stroke Mother    Hypertension Mother    Cancer Father    Cancer Sister        breast, ovarian, pancreatic   Heart disease Sister        x2   Cancer Brother    Heart disease Brother    Cancer Brother    Heart disease Brother    Cancer Brother    Heart disease Brother      Physical Exam  Triage Vital Signs: ED Triage Vitals  Encounter Vitals Group     BP 11/26/22 1909 (!) 142/49     Systolic BP Percentile --  Diastolic BP Percentile --      Pulse Rate 11/26/22 1909 (!) 58     Resp 11/26/22 1909 20     Temp 11/26/22 1909 98.9 F (37.2 C)     Temp Source 11/26/22 1909 Oral     SpO2 11/26/22 1909 95 %     Weight 11/26/22 1907 96 lb (43.5 kg)     Height 11/26/22 1907 4\' 11"  (1.499 m)     Head Circumference --      Peak Flow --      Pain Score 11/26/22 1907 4     Pain Loc --      Pain Education --      Exclude from Growth Chart --     Updated Vital Signs: BP (!) 142/49 (BP Location: Left Arm)   Pulse (!) 58   Temp 98.9 F (37.2 C) (Oral)   Resp 20   Ht 4\' 11"  (1.499 m)   Wt 43.5 kg   LMP  (LMP Kirsten)   SpO2 95%   BMI 19.39 kg/m    General: Awake, mild distress.  CV:  RRR.  Good peripheral perfusion.  Resp:  Normal effort.   Diminished aeration, otherwise CTAB. Abd:  Nontender.  No distention.  Other:  Alert and oriented to person and place.  CN II-XII grossly intact.  5/5 motor strength and sensation all extremities.  M AE x 4.   ED Results / Procedures / Treatments  Labs (all labs ordered are listed, but only abnormal results are displayed) Labs Reviewed  RESP PANEL BY RT-PCR (RSV, FLU A&B, COVID)  RVPGX2 - Abnormal; Notable for the following components:      Result Value   SARS Coronavirus 2 by RT PCR POSITIVE (*)    All other components within normal limits  CBC WITH DIFFERENTIAL/PLATELET - Abnormal; Notable for the following components:   Platelets 127 (*)    Monocytes Absolute 1.1 (*)    All other components within normal limits  COMPREHENSIVE METABOLIC PANEL - Abnormal; Notable for the following components:   Sodium 133 (*)    Glucose, Bld 144 (*)    All other components within normal limits  URINALYSIS, ROUTINE W REFLEX MICROSCOPIC - Abnormal; Notable for the following components:   Color, Urine YELLOW (*)    APPearance HAZY (*)    Ketones, ur 5 (*)    Leukocytes,Ua TRACE (*)    Bacteria, UA RARE (*)    All other components within normal limits  CBG MONITORING, ED - Abnormal; Notable for the following components:   Glucose-Capillary 129 (*)    All other components within normal limits  TROPONIN I (HIGH SENSITIVITY) - Abnormal; Notable for the following components:   Troponin I (High Sensitivity) 19 (*)    All other components within normal limits  CULTURE, BLOOD (ROUTINE X 2)  CULTURE, BLOOD (ROUTINE X 2)  LACTIC ACID, PLASMA  LACTIC ACID, PLASMA  TROPONIN I (HIGH SENSITIVITY)     EKG  ED ECG REPORT I, Haward Pope J, the attending physician, personally viewed and interpreted this ECG.   Date: 11/27/2022  EKG Time: 1918  Rate: 58  Rhythm: sinus bradycardia  Axis: Normal  Intervals:none  ST&T Change: Nonspecific    RADIOLOGY I have independently visualized and interpreted  patient's imaging studies as well as noted the radiology interpretation:  CT head: No ICH  Chest x-ray: Concerning for cavitary lesions, recommend chest CT  Chest CT: No cavitary lesions  Official radiology report(s): CT Chest Wo  Contrast  Result Date: 11/27/2022 CLINICAL DATA:  Pneumonia complication suspected. X-ray done. COVID. Eval cavitary lesion seen on x-ray. Cough, fatigue EXAM: CT CHEST WITHOUT CONTRAST TECHNIQUE: Multidetector CT imaging of the chest was performed following the standard protocol without IV contrast. RADIATION DOSE REDUCTION: This exam was performed according to the departmental dose-optimization program which includes automated exposure control, adjustment of the mA and/or kV according to patient size and/or use of iterative reconstruction technique. COMPARISON:  Chest radiograph 11/26/2022 and report from CT chest 12/30/2014 FINDINGS: Cardiovascular: No pericardial effusion. Coronary artery and aortic atherosclerotic calcification. Mediastinum/Nodes: Trachea and esophagus are unremarkable. No lymphadenopathy Lungs/Pleura: Lungs are clear. No pleural effusion or pneumothorax. Upper Abdomen: No acute abnormality. Musculoskeletal: No fracture is seen. The abnormality seen on radiographs 11/26/2022 corresponds to nodular calcifications in the costal cartilage. IMPRESSION: 1. No acute abnormality in the chest. No cavitary lesion is seen. The abnormality seen on radiographs 11/26/2022 corresponds to nodular calcifications in the costal cartilage. Aortic Atherosclerosis (ICD10-I70.0). Electronically Signed   By: Minerva Fester M.D.   On: 11/27/2022 02:11   DG Chest Portable 1 View  Result Date: 11/26/2022 CLINICAL DATA:  Weaker than normal EXAM: PORTABLE CHEST 1 VIEW COMPARISON:  06/05/2014 FINDINGS: Post sternotomy changes. Rounded metallic density over the mid to lower mediastinal silhouette presumably surgical in origin. No consolidation or effusion. Question cavitary  lesions in the medial left greater than right lung bases versus unusual appearance of cartilaginous calcification. Normal cardiac size. No pneumothorax. IMPRESSION: 1. Question cavitary lesions in the medial left greater than right lung bases versus unusual appearance of cartilaginous calcification. Recommend further evaluation with chest CT. Electronically Signed   By: Jasmine Pang M.D.   On: 11/26/2022 21:41   CT Head Wo Contrast  Result Date: 11/26/2022 CLINICAL DATA:  Altered mental status, cough, fatigue, UTI symptoms EXAM: CT HEAD WITHOUT CONTRAST TECHNIQUE: Contiguous axial images were obtained from the base of the skull through the vertex without intravenous contrast. RADIATION DOSE REDUCTION: This exam was performed according to the departmental dose-optimization program which includes automated exposure control, adjustment of the mA and/or kV according to patient size and/or use of iterative reconstruction technique. COMPARISON:  None Available. FINDINGS: Brain: No evidence of acute infarction, hemorrhage, mass, mass effect, or midline shift. No hydrocephalus or extra-axial fluid collection. Age related cerebral atrophy. Periventricular white matter changes, likely the sequela of chronic small vessel ischemic disease. Vascular: No hyperdense vessel. Atherosclerotic calcifications in the intracranial carotid and vertebral arteries. Skull: Negative for fracture or focal lesion. Sinuses/Orbits: No acute finding. Status post bilateral lens replacements. Other: The mastoid air cells are well aerated. IMPRESSION: No acute intracranial process. Electronically Signed   By: Wiliam Ke M.D.   On: 11/26/2022 19:54     PROCEDURES:  Critical Care performed: No  .1-3 Lead EKG Interpretation  Performed by: Irean Hong, MD Authorized by: Irean Hong, MD     Interpretation: normal     ECG rate:  60   ECG rate assessment: normal     Rhythm: sinus rhythm     Ectopy: none     Conduction: normal    Comments:     Patient placed on cardiac monitor to evaluate for arrhythmias    MEDICATIONS ORDERED IN ED: Medications  sodium chloride 0.9 % bolus 1,000 mL (has no administration in time range)  cefTRIAXone (ROCEPHIN) 1 g in sodium chloride 0.9 % 100 mL IVPB (has no administration in time range)  azithromycin (ZITHROMAX) 500 mg in  dextrose 5 % 250 mL IVPB (has no administration in time range)     IMPRESSION / MDM / ASSESSMENT AND PLAN / ED COURSE  I reviewed the triage vital signs and the nursing notes.                             89 year old female presenting with generalized weakness, altered mental status, cough and UTI symptoms. Differential diagnosis includes, but is not limited to, alcohol, illicit or prescription medications, or other toxic ingestion; intracranial pathology such as stroke or intracerebral hemorrhage; fever or infectious causes including sepsis; hypoxemia and/or hypercarbia; uremia; trauma; endocrine related disorders such as diabetes, hypoglycemia, and thyroid-related diseases; hypertensive encephalopathy; etc. I have personally reviewed patient's records and note her office visit from yesterday with her PCP.  Patient's presentation is most consistent with acute presentation with potential threat to life or bodily function.  The patient is on the cardiac monitor to evaluate for evidence of arrhythmia and/or significant heart rate changes.  Laboratory results remarkable for mild troponin elevation, leukocyte positive UTI, COVID+.  CT head negative.  Chest x-ray concerning for cavitary lesions, will obtain CT chest.  Initiate IV fluid hydration, IV antibiotics for community-acquired pneumonia.  Clinical Course as of 11/27/22 6962  Caleen Essex Nov 27, 2022  9528 CT chest demonstrates no cavitary lesions.  Will consult hospitalist services for evaluation and admission. [JS]    Clinical Course User Index [JS] Irean Hong, MD     FINAL CLINICAL IMPRESSION(S) / ED  DIAGNOSES   Final diagnoses:  Generalized weakness  Urinary tract infection without hematuria, site unspecified  Community acquired pneumonia, unspecified laterality  Altered mental status, unspecified altered mental status type     Rx / DC Orders   ED Discharge Orders     None        Note:  This document was prepared using Dragon voice recognition software and may include unintentional dictation errors.   Irean Hong, MD 11/27/22 [phone]

## 2022-11-27 NOTE — Assessment & Plan Note (Signed)
-   We will continue statin therapy. 

## 2022-11-27 NOTE — Assessment & Plan Note (Addendum)
-   This is associated with lethargy that could be related to community-acquired pneumonia and UTI. - She will be admitted to a medical telemetry bed. - Will continue antibiotic therapy with IV Rocephin and Zithromax. - We will follow blood and urine cultures. - Mucolytic therapy will be provided. - Bronchodilator therapy will be provided.

## 2022-11-27 NOTE — Telephone Encounter (Signed)
FYI to provider who saw the patient and PCP.

## 2022-11-27 NOTE — Progress Notes (Signed)
PHARMACIST - PHYSICIAN COMMUNICATION  CONCERNING:  Enoxaparin (Lovenox) for DVT Prophylaxis    RECOMMENDATION: Patient was prescribed enoxaprin 40mg  q24 hours for VTE prophylaxis.   Filed Weights   11/26/22 1907  Weight: 43.5 kg (96 lb)    Body mass index is 19.39 kg/m.  Estimated Creatinine Clearance: 31.3 mL/min (by C-G formula based on SCr of 0.83 mg/dL).  Patient is candidate for enoxaparin 30mg  every 24 hours based on CrCl <24ml/min or Weight <45kg  DESCRIPTION: Pharmacy has adjusted enoxaparin dose per [facility] policy.  Patient is now receiving enoxaparin 30 mg every 24 hours   Otelia Sergeant, PharmD, [facility] 11/27/2022 4:36 AM

## 2022-11-27 NOTE — Progress Notes (Addendum)
This is a nonbillable note.  Patient seen and examined, please see today's history and physical. Patient currently feels well, she had a fever at admission.  But no short of breath or cough.  Chest x-ray did not show pneumonia, procalcitonin level not elevated.  Patient denies any urinary symptoms, UA only has trace white cells, not consistent with UTI.  Will discontinue antibiotics.  Will follow-up overnight, obtain PT/OT.  Most likely will be discharged home tomorrow with home care.

## 2022-11-27 NOTE — ED Notes (Signed)
Pt assisted to the bathroom. Assisted onto and off the toilet with minimal standby assist from RN.

## 2022-11-27 NOTE — Progress Notes (Signed)
Ambulated patient to bathroom. She was very weak and had a heavy left sided lean. I placed a BSC near the bed for next use.

## 2022-11-27 NOTE — Telephone Encounter (Signed)
Noted  

## 2022-11-28 DIAGNOSIS — U071 COVID-19: Secondary | ICD-10-CM | POA: Diagnosis not present

## 2022-11-28 DIAGNOSIS — G9340 Encephalopathy, unspecified: Secondary | ICD-10-CM | POA: Diagnosis not present

## 2022-11-28 DIAGNOSIS — I5A Non-ischemic myocardial injury (non-traumatic): Secondary | ICD-10-CM

## 2022-11-28 LAB — URINE CULTURE

## 2022-11-28 MED ORDER — ALBUTEROL SULFATE HFA 108 (90 BASE) MCG/ACT IN AERS: 2.0000 | INHALATION_SPRAY | Freq: Four times a day (QID) | RESPIRATORY_TRACT | 0 refills | Status: DC | PRN

## 2022-11-28 MED ORDER — GUAIFENESIN 100 MG/5ML PO LIQD: 5.0000 mL | ORAL | 0 refills | Status: DC | PRN

## 2022-11-28 NOTE — Evaluation (Signed)
Physical Therapy Evaluation Patient Details Name: Kirsten Riley MRN: [id number] DOB: [birth year] Today's Date: 11/28/2022  History of Present Illness  Per MD: Kirsten Riley is a 89 y.o. female with medical history significant for coronary artery disease, type 2 diabetes mellitus, hypertension and dyslipidemia as well as hypothyroidism and PMR, who presented to the emergency room with acute onset of lethargy and generalized weakness, fatigue and cough with altered mental status and concern for UTI.   Clinical Impression  Pt received in Semi-Fowler's position and agreeable to therapy.  Pt performed well with all mobility, however noted to have some difficulty ambulating in the hallway without any UE support.  Pt given hand hold assistance and was able to ambulate with much more poise and stability.  Pt encouraged to utilize the AD's that she has at home in order to stay stable when walking.  Pt agreeable to the recommendation and the HHPT recommendation.          If plan is discharge home, recommend the following: A little help with walking and/or transfers;A little help with bathing/dressing/bathroom   Can travel by private vehicle        Equipment Recommendations None recommended by PT  Recommendations for Other Services       Functional Status Assessment Patient has had a recent decline in their functional status and demonstrates the ability to make significant improvements in function in a reasonable and predictable amount of time.     Precautions / Restrictions Precautions Precautions: Fall Precaution Comments: COVID Restrictions Weight Bearing Restrictions: No      Mobility  Bed Mobility Overal bed mobility: Modified Independent             General bed mobility comments: Pt with good bed mobility.    Transfers Overall transfer level: Modified independent Equipment used: None                    Ambulation/Gait Ambulation/Gait assistance: Supervision, Contact  guard assist (HHA) Gait Distance (Feet): 160 Feet Assistive device: None, 1 person hand held assist Gait Pattern/deviations: Step-through pattern, Drifts right/left, Narrow base of support Gait velocity: decreased     General Gait Details: Pt with narrow gait that pt tends to cross over when ambulation at times.  Stairs            Wheelchair Mobility     Tilt Bed    Modified Rankin (Stroke Patients Only)       Balance Overall balance assessment: Modified Independent                                           Pertinent Vitals/Pain Pain Assessment Pain Assessment: No/denies pain    Home Living Family/patient expects to be discharged to:: Private residence Living Arrangements: Alone Available Help at Discharge: Family;Available 24 hours/day Type of Home: House Home Access: Stairs to enter Entrance Stairs-Rails: Right Entrance Stairs-Number of Steps: 3   Home Layout: One level Home Equipment: Agricultural consultant (2 wheels);Tub bench;Grab bars - tub/shower      Prior Function Prior Level of Function : Independent/Modified Independent                     Extremity/Trunk Assessment   Upper Extremity Assessment Upper Extremity Assessment: Overall WFL for tasks assessed    Lower Extremity Assessment Lower Extremity Assessment: Overall WFL for tasks assessed  Communication   Communication Communication: No apparent difficulties  Cognition Arousal: Alert Behavior During Therapy: WFL for tasks assessed/performed Overall Cognitive Status: Within Functional Limits for tasks assessed                                          General Comments      Exercises     Assessment/Plan    PT Assessment Patient needs continued PT services  PT Problem List Decreased strength;Decreased activity tolerance;Decreased balance;Decreased mobility       PT Treatment Interventions DME instruction;Stair training;Gait  training;Therapeutic activities;Therapeutic exercise;Balance training;Neuromuscular re-education    PT Goals (Current goals can be found in the Care Plan section)  Acute Rehab PT Goals Patient Stated Goal: to go home. PT Goal Formulation: With patient Time For Goal Achievement: 12/12/22 Potential to Achieve Goals: Good    Frequency Min 1X/week     Co-evaluation PT/OT/SLP Co-Evaluation/Treatment: Yes Reason for Co-Treatment: Complexity of the patient's impairments (multi-system involvement) PT goals addressed during session: Mobility/safety with mobility;Balance;Proper use of DME OT goals addressed during session: ADL's and self-care;Proper use of Adaptive equipment and DME       AM-PAC PT "6 Clicks" Mobility  Outcome Measure Help needed turning from your back to your side while in a flat bed without using bedrails?: None Help needed moving from lying on your back to sitting on the side of a flat bed without using bedrails?: None Help needed moving to and from a bed to a chair (including a wheelchair)?: None Help needed standing up from a chair using your arms (e.g., wheelchair or bedside chair)?: A Little Help needed to walk in hospital room?: A Little Help needed climbing 3-5 steps with a railing? : A Little 6 Click Score: 21    End of Session Equipment Utilized During Treatment: Gait belt Activity Tolerance: Patient tolerated treatment well Patient left: in bed;with call bell/phone within reach Nurse Communication: Mobility status PT Visit Diagnosis: Unsteadiness on feet (R26.81);Other abnormalities of gait and mobility (R26.89);Muscle weakness (generalized) (M62.81)    Time: 4098-1191 PT Time Calculation (min) (ACUTE ONLY): 26 min   Charges:   PT Evaluation $PT Eval Low Complexity: 1 Low PT Treatments $Therapeutic Activity: 8-22 mins PT General Charges $$ ACUTE PT VISIT: 1 Visit         Nolon Bussing, PT, DPT Physical Therapist - [facility]  11/28/22, 1:47 PM

## 2022-11-28 NOTE — Evaluation (Signed)
Occupational Therapy Evaluation Patient Details Name: Kirsten Riley MRN: [id number] DOB: 1931-12-14 Today's Date: 11/28/2022   History of Present Illness Per MD: Kirsten Riley is a 89 y.o. female with medical history significant for coronary artery disease, type 2 diabetes mellitus, hypertension and dyslipidemia as well as hypothyroidism and PMR, who presented to the emergency room with acute onset of lethargy and generalized weakness, fatigue and cough with altered mental status and concern for UTI.   Clinical Impression   Upon entering patient in bed with daughter in room.  Patient willing to work with OT and PT therapy.  Patient report was independent prior to this episode that caused hospitalization.  She does mow her lawn but does not drive anymore.  Patient do report holding onto furniture at home when walking..  Initially patient little unsure and unsteady hand-held assist into the bathroom.  But got more self-confidence as she was up.  Toilet hygiene and clothing management independent with supervision as well as simulated upper and lower body dressing and bathing independent.  Daughter and son report they can provide initially 24-hour supervision.  Patient has modifications at tub shower combo as well as raised toilet seat.  Did discuss with family and patient home safety picking up any loose carpets, nightlight and appropriate shoes to decrease folding.  All in agreement.  No OT services indicated in the setting but would recommend home health OT.      If plan is discharge home, recommend the following:      Functional Status Assessment     Equipment Recommendations       Recommendations for Other Services       Precautions / Restrictions Precautions Precautions: Fall Precaution Comments: COVID Restrictions Weight Bearing Restrictions: No      Mobility Bed Mobility Overal bed mobility: Modified Independent             General bed mobility comments: Independent     Transfers                   General transfer comment: Hand-held assist to bathroom initially but then supervision holding onto furniture back to room      Balance Overall balance assessment: Independent                                         ADL either performed or assessed with clinical judgement   ADL                                         General ADL Comments: Initially hand-held assist into bathroom.  Independent in toilet taking including clothing management and hygiene.  Patient to hold onto furniture.  Appear prior level she held onto furniture or uses single-point cane at times.  Upper and lower body bathing and dressing independent with supervision.  Eating and grooming independent after coming out of bathroom patient needed not hand-held assist look more confident in ambulation.     Vision Patient Visual Report: No change from baseline       Perception         Praxis         Pertinent Vitals/Pain Pain Assessment Pain Assessment: No/denies pain     Extremity/Trunk Assessment Upper Extremity Assessment Upper Extremity Assessment: Overall WFL for tasks  assessed   Lower Extremity Assessment Lower Extremity Assessment: Overall WFL for tasks assessed       Communication Communication Communication: No apparent difficulties   Cognition Arousal: Alert Behavior During Therapy: WFL for tasks assessed/performed Overall Cognitive Status: Within Functional Limits for tasks assessed                                       General Comments       Exercises     Shoulder Instructions      Home Living Family/patient expects to be discharged to:: Private residence Living Arrangements: Alone Available Help at Discharge: Family;Available 24 hours/day Type of Home: House Home Access: Stairs to enter [employer] of Steps: 3 Entrance Stairs-Rails: Right Home Layout: One level     Bathroom  Shower/Tub: Tub/shower unit (Patient has a railing as well as a Programme researcher, broadcasting/film/video)   Bathroom Toilet: Standard     Home Equipment: Agricultural consultant (2 wheels);Tub bench;Grab bars - tub/shower          Prior Functioning/Environment Prior Level of Function : Independent/Modified Independent                        OT Problem List:        OT Treatment/Interventions:      OT Goals(Current goals can be found in the care plan section) Acute Rehab OT Goals Patient Stated Goal: Go home I like to be independent OT Goal Formulation: With patient/family Time For Goal Achievement: 11/28/22 Potential to Achieve Goals: Good  OT Frequency:      Co-evaluation   Reason for Co-Treatment: Complexity of the patient's impairments (multi-system involvement) PT goals addressed during session: Mobility/safety with mobility;Balance;Proper use of DME OT goals addressed during session: ADL's and self-care;Proper use of Adaptive equipment and DME      AM-PAC OT "6 Clicks" Daily Activity     Outcome Measure Help from another person eating meals?: None Help from another person taking care of personal grooming?: None Help from another person toileting, which includes using toliet, bedpan, or urinal?: None Help from another person bathing (including washing, rinsing, drying)?: None Help from another person to put on and taking off regular upper body clothing?: None Help from another person to put on and taking off regular lower body clothing?: None 6 Click Score: 24   End of Session Equipment Utilized During Treatment: Gait belt Nurse Communication: Mobility status  Activity Tolerance: Patient tolerated treatment well Patient left: in bed;with family/visitor present  OT Visit Diagnosis: History of falling (Z91.81)                Time: 6644-0347 OT Time Calculation (min): 26 min Charges:  OT General Charges $OT Visit: 1 Visit OT Evaluation $OT Eval Low Complexity: 1  Low   Ronn Smolinsky OTR/L,CLT 11/28/2022, 1:49 PM

## 2022-11-28 NOTE — TOC Transition Note (Signed)
Transition of Care [facility]) - CM/SW Discharge Note   Patient Details  Name: Kirsten Riley MRN: [id number] Date of Birth: Mar 24, 1931  Transition of Care [facility]) CM/SW Contact:  Luvenia Redden, RN Phone Number: 11/28/2022, 1:25 PM   Clinical Narrative:      [facility] RN responded to family request for resources for [facility] and meal services. RN visited floor and spoke with daughter Elease Hashimoto) and son. Provided local food resources as family aware [facility] is not responsible for outside resources and/or arrangements of services. Also verified orders for HHPT/OT. No preference for choice for [facility] as [facility] and [facility] called for services. TOC RN also encouraged family to call customer services via [facility] on back of her insurance card for [employer] services of available post hospital discharge.   Frances Furbish Kandee Keen) accepted pt for [employer]. Thin information was provided to the son and daughter. [facility] Elnita Maxwell) declined.  No other request at this time.        Patient Goals and CMS Choice      Discharge Placement                         Discharge Plan and Services Additional resources added to the After Visit Summary for                                       Social Determinants of Health (SDOH) Interventions SDOH Screenings   Food Insecurity: No Food Insecurity (11/27/2022)  Housing: Low Risk  (11/27/2022)  Transportation Needs: No Transportation Needs (11/27/2022)  Utilities: Not At Risk (11/27/2022)  Alcohol Screen: Low Risk  (07/07/2022)  Depression (PHQ2-9): Low Risk  (11/26/2022)  Financial Resource Strain: Low Risk  (07/07/2022)  Physical Activity: Insufficiently Active (07/07/2022)  Social Connections: Moderately Isolated (07/07/2022)  Stress: No Stress Concern Present (07/07/2022)  Tobacco Use: Low Risk  (11/27/2022)     Readmission Risk Interventions     No data to display

## 2022-11-28 NOTE — Discharge Summary (Addendum)
Physician Discharge Summary   Patient: Kirsten Riley MRN: [id number] DOB: 1931-05-01  Admit date:     11/27/2022  Discharge date: 11/28/22  Discharge Physician: Marrion Coy   PCP: Marjie Skiff, NP   Recommendations at discharge:   Follow-up with PCP in 1 week.  Discharge Diagnoses: Principal Problem:   Acute encephalopathy Active Problems:   Type 2 diabetes, diet controlled (HCC)   COVID-19 virus infection   Hyponatremia   Thrombocytopenia (HCC)   Nonischemic nontraumatic myocardial injury   Hypothyroidism   Dyslipidemia   Community acquired pneumonia   Urinary tract infection without hematuria Urinary tract infection ruled out.  Resolved Problems:   * No resolved hospital problems. *  Hospital Course: MODIE WESS is a 89 y.o. female with medical history significant for coronary artery disease, type 2 diabetes mellitus, hypertension and dyslipidemia as well as hypothyroidism and PMR, who presented to the emergency room with acute onset of lethargy and generalized weakness, fatigue and cough with altered mental status.  UA on he has few white cells, urine culture grow mixed flora not, not consistent with UTI.  As result, UTI ruled out. Patient was monitored overnight, she has some upper respite symptoms, no longer has any confusion.  She has weakness, will obtain home PT/OT.    Assessment and Plan: * Acute metabolic encephalopathy secondary to COVID. COVID-19 virus infection Thrombocytopenia secondary to COVID infection. UTI ruled out. Pneumonia ruled out. Urine culture does not support a UTI, chest CT did not show any acute changes. Currently, patient only has some upper respite symptoms, no need for additional stay in the hospital. Obtain PT/OT as outpatient.  Elevated troponin secondary to demand ischemia.   Peak troponin 23.  No concern for non-STEMI.  Hyponatremia. Resolved.  Type 2 diabetes, diet controlled (HCC) Follow-up with PCP as  outpatient.  Dyslipidemia - We will continue statin therapy.  Hypothyroidism Continue home treatment.        Consultants: None Procedures performed: None  Disposition: Home health Diet recommendation:  Discharge Diet Orders (From admission, onward)     Start     Ordered   11/28/22 0000  Diet - low sodium heart healthy        11/28/22 0935           Cardiac diet DISCHARGE MEDICATION: Allergies as of 11/28/2022       Reactions   Sulfa Antibiotics Other (See Comments)   Penicillins Rash        Medication List     STOP taking these medications    chlorhexidine 0.12 % solution Commonly known as: PERIDEX       TAKE these medications    albuterol 108 (90 Base) MCG/ACT inhaler Commonly known as: VENTOLIN HFA Inhale 2 puffs into the lungs every 6 (six) hours as needed.   cholecalciferol 25 MCG (1000 UNIT) tablet Commonly known as: VITAMIN D3 Take 2,000 Units by mouth daily. Take once a day   Fish Oil 300 MG Caps Take by mouth.   guaiFENesin 100 MG/5ML liquid Commonly known as: ROBITUSSIN Take 5 mLs by mouth every 4 (four) hours as needed for cough or to loosen phlegm.   levothyroxine 75 MCG tablet Commonly known as: SYNTHROID Take 1 tablet (75 mcg total) by mouth daily before breakfast.   lovastatin 40 MG tablet Commonly known as: MEVACOR Take 1 tablet (40 mg total) by mouth at bedtime.        Follow-up Information     Marjie Skiff,  NP Follow up in 1 week(s).   Specialty: Nurse Practitioner Contact information: [id number] [place] [address] [phone]                Discharge Exam: Ceasar Mons Weights   11/26/22 1907  Weight: 43.5 kg   General exam: Appears calm and comfortable  Respiratory system: Clear to auscultation. Respiratory effort normal. Cardiovascular system: S1 & S2 heard, RRR. No JVD, murmurs, rubs, gallops or clicks. No pedal edema. Gastrointestinal system: Abdomen is nondistended, soft and nontender. No  organomegaly or masses felt. Normal bowel sounds heard. Central nervous system: Alert and oriented. No focal neurological deficits. Extremities: Symmetric 5 x 5 power. Skin: No rashes, lesions or ulcers Psychiatry: Judgement and insight appear normal. Mood & affect appropriate.    Condition at discharge: good  The results of significant diagnostics from this hospitalization (including imaging, microbiology, ancillary and laboratory) are listed below for reference.   Imaging Studies: CT Chest Wo Contrast  Result Date: 11/27/2022 CLINICAL DATA:  Pneumonia complication suspected. X-ray done. COVID. Eval cavitary lesion seen on x-ray. Cough, fatigue EXAM: CT CHEST WITHOUT CONTRAST TECHNIQUE: Multidetector CT imaging of the chest was performed following the standard protocol without IV contrast. RADIATION DOSE REDUCTION: This exam was performed according to the departmental dose-optimization program which includes automated exposure control, adjustment of the mA and/or kV according to patient size and/or use of iterative reconstruction technique. COMPARISON:  Chest radiograph 11/26/2022 and report from CT chest 12/30/2014 FINDINGS: Cardiovascular: No pericardial effusion. Coronary artery and aortic atherosclerotic calcification. Mediastinum/Nodes: Trachea and esophagus are unremarkable. No lymphadenopathy Lungs/Pleura: Lungs are clear. No pleural effusion or pneumothorax. Upper Abdomen: No acute abnormality. Musculoskeletal: No fracture is seen. The abnormality seen on radiographs 11/26/2022 corresponds to nodular calcifications in the costal cartilage. IMPRESSION: 1. No acute abnormality in the chest. No cavitary lesion is seen. The abnormality seen on radiographs 11/26/2022 corresponds to nodular calcifications in the costal cartilage. Aortic Atherosclerosis (ICD10-I70.0). Electronically Signed   By: Minerva Fester M.D.   On: 11/27/2022 02:11   DG Chest Portable 1 View  Result Date:  11/26/2022 CLINICAL DATA:  Weaker than normal EXAM: PORTABLE CHEST 1 VIEW COMPARISON:  06/05/2014 FINDINGS: Post sternotomy changes. Rounded metallic density over the mid to lower mediastinal silhouette presumably surgical in origin. No consolidation or effusion. Question cavitary lesions in the medial left greater than right lung bases versus unusual appearance of cartilaginous calcification. Normal cardiac size. No pneumothorax. IMPRESSION: 1. Question cavitary lesions in the medial left greater than right lung bases versus unusual appearance of cartilaginous calcification. Recommend further evaluation with chest CT. Electronically Signed   By: Jasmine Pang M.D.   On: 11/26/2022 21:41   CT Head Wo Contrast  Result Date: 11/26/2022 CLINICAL DATA:  Altered mental status, cough, fatigue, UTI symptoms EXAM: CT HEAD WITHOUT CONTRAST TECHNIQUE: Contiguous axial images were obtained from the base of the skull through the vertex without intravenous contrast. RADIATION DOSE REDUCTION: This exam was performed according to the departmental dose-optimization program which includes automated exposure control, adjustment of the mA and/or kV according to patient size and/or use of iterative reconstruction technique. COMPARISON:  None Available. FINDINGS: Brain: No evidence of acute infarction, hemorrhage, mass, mass effect, or midline shift. No hydrocephalus or extra-axial fluid collection. Age related cerebral atrophy. Periventricular white matter changes, likely the sequela of chronic small vessel ischemic disease. Vascular: No hyperdense vessel. Atherosclerotic calcifications in the intracranial carotid and vertebral arteries. Skull: Negative for fracture or focal lesion. Sinuses/Orbits:  No acute finding. Status post bilateral lens replacements. Other: The mastoid air cells are well aerated. IMPRESSION: No acute intracranial process. Electronically Signed   By: Wiliam Ke M.D.   On: 11/26/2022 19:54     Microbiology: Results for orders placed or performed during the hospital encounter of 11/27/22  Resp panel by RT-PCR (RSV, Flu A&B, Covid) Anterior Nasal Swab     Status: Abnormal   Collection Time: 11/26/22  7:46 PM   Specimen: Anterior Nasal Swab  Result Value Ref Range Status   SARS Coronavirus 2 by RT PCR POSITIVE (A) NEGATIVE Final    Comment: (NOTE) SARS-CoV-2 target nucleic acids are DETECTED.  The SARS-CoV-2 RNA is generally detectable in upper respiratory specimens during the acute phase of infection. Positive results are indicative of the presence of the identified virus, but do not rule out bacterial infection or co-infection with other pathogens not detected by the test. Clinical correlation with patient history and other diagnostic information is necessary to determine patient infection status. The expected result is Negative.  Fact Sheet for Patients: BloggerCourse.com  Fact Sheet for Healthcare Providers: SeriousBroker.it  This test is not yet approved or cleared by the [place] FDA and  has been authorized for detection and/or diagnosis of SARS-CoV-2 by FDA under an Emergency Use Authorization (EUA).  This EUA will remain in effect (meaning this test can be used) for the duration of  the COVID-19 declaration under Section 564(b)(1) of the A ct, 21 U.S.C. section 360bbb-3(b)(1), unless the authorization is terminated or revoked sooner.     Influenza A by PCR NEGATIVE NEGATIVE Final   Influenza B by PCR NEGATIVE NEGATIVE Final    Comment: (NOTE) The Xpert Xpress SARS-CoV-2/FLU/RSV plus assay is intended as an aid in the diagnosis of influenza from Nasopharyngeal swab specimens and should not be used as a sole basis for treatment. Nasal washings and aspirates are unacceptable for Xpert Xpress SARS-CoV-2/FLU/RSV testing.  Fact Sheet for Patients: BloggerCourse.com  Fact Sheet for  Healthcare Providers: SeriousBroker.it  This test is not yet approved or cleared by the [place] FDA and has been authorized for detection and/or diagnosis of SARS-CoV-2 by FDA under an Emergency Use Authorization (EUA). This EUA will remain in effect (meaning this test can be used) for the duration of the COVID-19 declaration under Section 564(b)(1) of the Act, 21 U.S.C. section 360bbb-3(b)(1), unless the authorization is terminated or revoked.     Resp Syncytial Virus by PCR NEGATIVE NEGATIVE Final    Comment: (NOTE) Fact Sheet for Patients: BloggerCourse.com  Fact Sheet for Healthcare Providers: SeriousBroker.it  This test is not yet approved or cleared by the [place] FDA and has been authorized for detection and/or diagnosis of SARS-CoV-2 by FDA under an Emergency Use Authorization (EUA). This EUA will remain in effect (meaning this test can be used) for the duration of the COVID-19 declaration under Section 564(b)(1) of the Act, 21 U.S.C. section 360bbb-3(b)(1), unless the authorization is terminated or revoked.  Performed at [facility], [address] [place], [place] [address]   Culture, blood (routine x 2)     Status: None (Preliminary result)   Collection Time: 11/27/22  2:04 AM   Specimen: BLOOD  Result Value Ref Range Status   Specimen Description BLOOD LEFT  Final   Special Requests   Final    BOTTLES DRAWN AEROBIC AND ANAEROBIC Blood Culture adequate volume   Culture   Final    NO GROWTH 1 DAY Performed at [facility], [address] [place], [place] [address]    Report Status PENDING  Incomplete  Culture, blood (routine x 2)     Status: None (Preliminary result)   Collection Time: 11/27/22  2:41 AM   Specimen: BLOOD  Result Value Ref Range Status   Specimen Description BLOOD BLOOD RIGHT FOREARM  Final   Special Requests   Final    BOTTLES DRAWN  AEROBIC AND ANAEROBIC Blood Culture results may not be optimal due to an excessive volume of blood received in culture bottles   Culture   Final    NO GROWTH 1 DAY Performed at [facility], [address] [place], [place] [address]    Report Status PENDING  Incomplete    Labs: CBC: Recent Labs  Lab 11/26/22 1342 11/26/22 2013 11/27/22 0502  WBC 6.0 8.4 6.1  NEUTROABS 3.8 6.2  --   HGB 13.7 13.6 11.9*  HCT 41.6 41.2 36.6  MCV 94.8 92.0 93.1  PLT 130* 127* 111*   Basic Metabolic Panel: Recent Labs  Lab 11/26/22 2013 11/27/22 0502  NA 133* 137  K 4.0 3.8  CL 103 107  CO2 24 22  GLUCOSE 144* 190*  BUN 16 13  CREATININE 0.83 0.71  CALCIUM 8.9 8.1*   Liver Function Tests: Recent Labs  Lab 11/26/22 2013  AST 39  ALT 37  ALKPHOS 84  BILITOT 0.7  PROT 6.7  ALBUMIN 3.8   CBG: Recent Labs  Lab 11/26/22 1928  GLUCAP 129*    Discharge time spent: greater than 30 minutes.  Signed: Marrion Coy, MD [facility] 11/28/2022

## 2022-12-02 LAB — CULTURE, BLOOD (ROUTINE X 2)
Culture: NO GROWTH
Culture: NO GROWTH
Special Requests: ADEQUATE

## 2022-12-03 ENCOUNTER — Ambulatory Visit: Payer: Medicare PPO | Admitting: Family Medicine

## 2022-12-03 VITALS — BP 140/56 | HR 54 | Temp 97.8°F | Ht 58.86 in | Wt 102.2 lb

## 2022-12-03 DIAGNOSIS — R2689 Other abnormalities of gait and mobility: Secondary | ICD-10-CM

## 2022-12-03 DIAGNOSIS — R350 Frequency of micturition: Secondary | ICD-10-CM

## 2022-12-03 NOTE — Progress Notes (Unsigned)
BP (!) 140/56   Pulse (!) 54   Temp 97.8 F (36.6 C) (Oral)   Ht 4' 10.86" (1.495 m)   Wt 102 lb 3.2 oz (46.4 kg)   LMP  (LMP Unknown)   SpO2 98%   BMI 20.74 kg/m    Subjective:    Patient ID: Kirsten Riley, female    DOB: March 26, 1931, 89 y.o.   MRN: [id number]  HPI: Kirsten Riley is a 89 y.o. female  Chief Complaint  Patient presents with   Follow-up    Hospitalization follow up   Patient is here today to follow up on balance issues and urinary frequency from her previous visit and follow up for hospitalization for COVID. She admits that her balance has improved, denies issues today. Denies urinary frequency, urgency, odor in urine, states previous urinary symptoms have improved. She denies chest pain, sob, cough, dsyphagia.   Time since discharge: 11/27/2022 Hospital/facility: [facility] Diagnosis: COVID 19 virus infection Procedures/tests: UA, respiratory panel (COVID +), CMP, CBC, blood cultures, EKG, chest CT, Chest xray, Head CT Consultants: None  New medications: Robitussin, albuterol inhaler Discharge instructions:  Low sodium diet and heart healthy Status: better   Relevant past medical, surgical, family and social history reviewed and updated as indicated. Interim medical history since our last visit reviewed. Allergies and medications reviewed and updated.  Review of Systems  Constitutional:  Negative for chills and fever.  Respiratory: Negative.    Cardiovascular: Negative.   Gastrointestinal:  Negative for vomiting.  Genitourinary:  Negative for decreased urine volume, difficulty urinating, dyspareunia, dysuria, flank pain, frequency, hematuria, pelvic pain and urgency.  Musculoskeletal:  Negative for back pain.    Per HPI unless specifically indicated above     Objective:    BP (!) 140/56   Pulse (!) 54   Temp 97.8 F (36.6 C) (Oral)   Ht 4' 10.86" (1.495 m)   Wt 102 lb 3.2 oz (46.4 kg)   LMP  (LMP Unknown)   SpO2 98%   BMI  20.74 kg/m   Wt Readings from Last 3 Encounters:  12/03/22 102 lb 3.2 oz (46.4 kg)  11/26/22 96 lb (43.5 kg)  10/20/22 96 lb 9.6 oz (43.8 kg)    Physical Exam Vitals and nursing note reviewed.  Constitutional:      General: She is awake. She is not in acute distress.    Appearance: Normal appearance. She is well-developed and well-groomed. She is not ill-appearing.  HENT:     Head: Normocephalic and atraumatic.     Right Ear: Hearing and external ear normal. No drainage.     Left Ear: Hearing and external ear normal. No drainage.     Nose: Nose normal.  Eyes:     General: Lids are normal.        Right eye: No discharge.        Left eye: No discharge.     Conjunctiva/sclera: Conjunctivae normal.  Cardiovascular:     Rate and Rhythm: Regular rhythm. Bradycardia present.     Heart sounds: Normal heart sounds, S1 normal and S2 normal. No murmur heard.    No gallop.  Pulmonary:     Effort: Pulmonary effort is normal. No accessory muscle usage or respiratory distress.     Breath sounds: Normal breath sounds.  Musculoskeletal:        General: Normal range of motion.     Cervical back: Full passive range of motion without pain and normal range  of motion.     Right lower leg: No edema.     Left lower leg: No edema.  Skin:    General: Skin is warm and dry.     Capillary Refill: Capillary refill takes less than 2 seconds.  Neurological:     Mental Status: She is alert and oriented to person, place, and time.     Cranial Nerves: No cranial nerve deficit.     Sensory: Sensation is intact.     Motor: No weakness.     Gait: Gait normal.  Psychiatric:        Attention and Perception: Attention normal.        Mood and Affect: Mood normal.        Speech: Speech normal.        Behavior: Behavior normal. Behavior is cooperative.        Thought Content: Thought content normal.     Results for orders placed or performed during the hospital encounter of 11/27/22  Resp panel by RT-PCR  (RSV, Flu A&B, Covid) Anterior Nasal Swab   Specimen: Anterior Nasal Swab  Result Value Ref Range   SARS Coronavirus 2 by RT PCR POSITIVE (A) NEGATIVE   Influenza A by PCR NEGATIVE NEGATIVE   Influenza B by PCR NEGATIVE NEGATIVE   Resp Syncytial Virus by PCR NEGATIVE NEGATIVE  Culture, blood (routine x 2)   Specimen: BLOOD  Result Value Ref Range   Specimen Description BLOOD BLOOD RIGHT FOREARM    Special Requests      BOTTLES DRAWN AEROBIC AND ANAEROBIC Blood Culture results may not be optimal due to an excessive volume of blood received in culture bottles   Culture      NO GROWTH 5 DAYS Performed at [facility], [address] [place], [place] [address]    Report Status 12/02/2022 FINAL   Culture, blood (routine x 2)   Specimen: BLOOD  Result Value Ref Range   Specimen Description BLOOD LEFT    Special Requests      BOTTLES DRAWN AEROBIC AND ANAEROBIC Blood Culture adequate volume   Culture      NO GROWTH 5 DAYS Performed at [facility], [address] [place], [place] [address]    Report Status 12/02/2022 FINAL   CBC with Differential  Result Value Ref Range   WBC 8.4 4.0 - 10.5 K/uL   RBC 4.48 3.87 - 5.11 MIL/uL   Hemoglobin 13.6 12.0 - 15.0 g/dL   HCT 09.8 11.9 - 14.7 %   MCV 92.0 80.0 - 100.0 fL   MCH 30.4 26.0 - 34.0 pg   MCHC 33.0 30.0 - 36.0 g/dL   RDW 82.9 56.2 - 13.0 %   Platelets 127 (L) 150 - 400 K/uL   nRBC 0.0 0.0 - 0.2 %   Neutrophils Relative % 75 %   Neutro Abs 6.2 1.7 - 7.7 K/uL   Lymphocytes Relative 12 %   Lymphs Abs 1.0 0.7 - 4.0 K/uL   Monocytes Relative 13 %   Monocytes Absolute 1.1 (H) 0.1 - 1.0 K/uL   Eosinophils Relative 0 %   Eosinophils Absolute 0.0 0.0 - 0.5 K/uL   Basophils Relative 0 %   Basophils Absolute 0.0 0.0 - 0.1 K/uL   Immature Granulocytes 0 %   Abs Immature Granulocytes 0.02 0.00 - 0.07 K/uL  Comprehensive metabolic panel  Result Value Ref Range   Sodium 133 (L) 135 - 145 mmol/L   Potassium 4.0  3.5 - 5.1  mmol/L   Chloride 103 98 - 111 mmol/L   CO2 24 22 - 32 mmol/L   Glucose, Bld 144 (H) 70 - 99 mg/dL   BUN 16 8 - 23 mg/dL   Creatinine, Ser 1.61 0.44 - 1.00 mg/dL   Calcium 8.9 8.9 - 09.6 mg/dL   Total Protein 6.7 6.5 - 8.1 g/dL   Albumin 3.8 3.5 - 5.0 g/dL   AST 39 15 - 41 U/L   ALT 37 0 - 44 U/L   Alkaline Phosphatase 84 38 - 126 U/L   Total Bilirubin 0.7 <1.2 mg/dL   GFR, Estimated >04 >54 mL/min   Anion gap 6 5 - 15  Urinalysis, Routine w reflex microscopic -Urine, Clean Catch  Result Value Ref Range   Color, Urine YELLOW (A) YELLOW   APPearance HAZY (A) CLEAR   Specific Gravity, Urine 1.019 1.005 - 1.030   pH 5.0 5.0 - 8.0   Glucose, UA NEGATIVE NEGATIVE mg/dL   Hgb urine dipstick NEGATIVE NEGATIVE   Bilirubin Urine NEGATIVE NEGATIVE   Ketones, ur 5 (A) NEGATIVE mg/dL   Protein, ur NEGATIVE NEGATIVE mg/dL   Nitrite NEGATIVE NEGATIVE   Leukocytes,Ua TRACE (A) NEGATIVE   RBC / HPF 0-5 0 - 5 RBC/hpf   WBC, UA 6-10 0 - 5 WBC/hpf   Bacteria, UA RARE (A) NONE SEEN   Squamous Epithelial / HPF 0-5 0 - 5 /HPF   Mucus PRESENT   Lactic acid, plasma  Result Value Ref Range   Lactic Acid, Venous 1.1 0.5 - 1.9 mmol/L  Lactic acid, plasma  Result Value Ref Range   Lactic Acid, Venous 1.5 0.5 - 1.9 mmol/L  Basic metabolic panel  Result Value Ref Range   Sodium 137 135 - 145 mmol/L   Potassium 3.8 3.5 - 5.1 mmol/L   Chloride 107 98 - 111 mmol/L   CO2 22 22 - 32 mmol/L   Glucose, Bld 190 (H) 70 - 99 mg/dL   BUN 13 8 - 23 mg/dL   Creatinine, Ser 0.98 0.44 - 1.00 mg/dL   Calcium 8.1 (L) 8.9 - 10.3 mg/dL   GFR, Estimated >11 >91 mL/min   Anion gap 8 5 - 15  CBC  Result Value Ref Range   WBC 6.1 4.0 - 10.5 K/uL   RBC 3.93 3.87 - 5.11 MIL/uL   Hemoglobin 11.9 (L) 12.0 - 15.0 g/dL   HCT 47.8 29.5 - 62.1 %   MCV 93.1 80.0 - 100.0 fL   MCH 30.3 26.0 - 34.0 pg   MCHC 32.5 30.0 - 36.0 g/dL   RDW 30.8 65.7 - 84.6 %   Platelets 111 (L) 150 - 400 K/uL   nRBC 0.0 0.0 - 0.2  %  TSH  Result Value Ref Range   TSH 0.032 (L) 0.350 - 4.500 uIU/mL  CBG monitoring, ED  Result Value Ref Range   Glucose-Capillary 129 (H) 70 - 99 mg/dL   Comment 1 Notify RN   Troponin I (High Sensitivity)  Result Value Ref Range   Troponin I (High Sensitivity) 19 (H) <18 ng/L  Troponin I (High Sensitivity)  Result Value Ref Range   Troponin I (High Sensitivity) 23 (H) <18 ng/L      Assessment & Plan:   Problem List Items Addressed This Visit     Poor balance - Primary    Improved. Patient admits she is feeling better since previous visit and denies falls or balance issues since recent COVID 19 hospitalization. Return as needed.  Other Visit Diagnoses     Urinary frequency       Resolved.        Follow up plan: Return if symptoms worsen or fail to improve.

## 2022-12-08 DIAGNOSIS — R2689 Other abnormalities of gait and mobility: Secondary | ICD-10-CM | POA: Insufficient documentation

## 2022-12-08 NOTE — Assessment & Plan Note (Signed)
Improved. Patient admits she is feeling better since previous visit and denies falls or balance issues since recent COVID 19 hospitalization. Return as needed.

## 2022-12-14 ENCOUNTER — Ambulatory Visit: Payer: Medicare PPO | Admitting: Family Medicine

## 2023-03-05 ENCOUNTER — Ambulatory Visit: Payer: Self-pay | Admitting: Nurse Practitioner

## 2023-03-05 ENCOUNTER — Other Ambulatory Visit (HOSPITAL_COMMUNITY)
Admission: RE | Admit: 2023-03-05 | Discharge: 2023-03-05 | Disposition: A | Discharge status: Home / Self Care | Payer: Medicare PPO | Source: Ambulatory Visit | Attending: Family Medicine | Admitting: Family Medicine

## 2023-03-05 ENCOUNTER — Ambulatory Visit: Payer: Medicare PPO | Admitting: Family Medicine

## 2023-03-05 ENCOUNTER — Encounter: Payer: Self-pay | Admitting: Family Medicine

## 2023-03-05 VITALS — BP 152/64 | HR 86 | Temp 97.3°F | Wt 101.0 lb

## 2023-03-05 DIAGNOSIS — M7989 Other specified soft tissue disorders: Secondary | ICD-10-CM | POA: Diagnosis not present

## 2023-03-05 DIAGNOSIS — L089 Local infection of the skin and subcutaneous tissue, unspecified: Secondary | ICD-10-CM | POA: Insufficient documentation

## 2023-03-05 DIAGNOSIS — L723 Sebaceous cyst: Secondary | ICD-10-CM | POA: Insufficient documentation

## 2023-03-05 MED ORDER — LIDOCAINE-EPINEPHRINE 2 %-1:100000 IJ SOLN
5.0000 mg | Freq: Once | INTRAMUSCULAR | Status: AC
  Administered 2023-03-05: 5 mg

## 2023-03-05 MED ORDER — DOXYCYCLINE HYCLATE 100 MG PO TABS: 100.0000 mg | ORAL_TABLET | Freq: Two times a day (BID) | ORAL | 0 refills | Status: DC

## 2023-03-05 NOTE — Addendum Note (Signed)
 Addended by: Ginette Pitman on: 03/05/2023 04:59 PM   Modules accepted: Orders

## 2023-03-05 NOTE — Telephone Encounter (Signed)
 Copied from CRM [id number]. Topic: Clinical - Medical Advice >> Mar 05, 2023 12:50 PM Tiffany B wrote: Reason for CRM: Patient had a biopsy several years ago and patient daughter noticed the site area on patient stomach is red and wound is open, patient states its not painful. Caller states patient informed her today of the wound and its been like that for 2 weeks. Reason for Disposition  [1] Pus or cloudy fluid draining from wound AND [2] no fever  Answer Assessment - Initial Assessment Questions 1. LOCATION: "Where is the wound located?"      Stomach   It's a wound that is open and red.   It's from an old biopsy site from years ago.   Daughter Elease Hashimoto calling in.   It's nicket sized.   It's full of infection and pus. 2. WOUND APPEARANCE: "What does the wound look like?"      Pus and infection.   No pain.   3. SIZE: If redness is present, ask: "What is the size of the red area?" (Inches, centimeters, or compare to size of a coin)      Nickel sized 4. SPREAD: "What's changed in the last day?"  "Do you see any red streaks coming from the wound?"     Been this way for 2 weeks. 5. ONSET: "When did it start to look infected?"      2 weeks ago 6. MECHANISM: "How did the wound start, what was the cause?"      An old biopsy site from years ago 7. PAIN: "Is there any pain?" If Yes, ask: "How bad is the pain?"   (Scale 1-10; or mild, moderate, severe)     No 8. FEVER: "Do you have a fever?" If Yes, ask: "What is your temperature, how was it measured, and when did it start?"     No 9. OTHER SYMPTOMS: "Do you have any other symptoms?" (e.g., shaking chills, weakness, rash elsewhere on body)     No 10. PREGNANCY: "Is there any chance you are pregnant?" "When was your last menstrual period?"       N/A  Protocols used: Wound Infection-A-AH  Chief Complaint: Old biopsy site on stomach is open, red and draining Symptoms: above Frequency: For the last 2 weeks Pertinent Negatives: Patient denies pain or  fever Disposition: [] ED /[] Urgent Care (no appt availability in office) / [x] Appointment(In office/virtual)/ []  [facility]/ [] Home Care/ [] Refused Recommended Disposition /[] [facility]/ []  Follow-up with PCP Additional Notes: Appt made with Dr. Laural Benes for 03/08/2023 at 9:00.

## 2023-03-05 NOTE — Telephone Encounter (Signed)
 Patient has been scheduled for today at 2:40 with Dr. Laural Benes.

## 2023-03-05 NOTE — Telephone Encounter (Signed)
 This sounds like she needs to be seen sooner. I can see her at 2:40 if she can get here

## 2023-03-05 NOTE — Progress Notes (Signed)
 BP (!) 152/64 (BP Location: Left Arm, Cuff Size: Small)   Pulse 86   Temp (!) 97.3 F (36.3 C) (Oral)   Wt 101 lb (45.8 kg)   LMP  (LMP Unknown)   SpO2 96%   BMI 20.50 kg/m    Subjective:    Patient ID: Kirsten Riley, female    DOB: 04/23/1931, 89 y.o.   MRN: [id number]  HPI: Kirsten Riley is a 89 y.o. female  Chief Complaint  Patient presents with   Recurrent Skin Infections    Bleeding/drainage and odor its been going on for 2 weeks Left lower abdominal Tried Vaseline and cover w. bandage   SKIN INFECTION Duration: 2 weeks Location: abomen History of trauma in area: no Pain: no Quality: no pain Severity: no pain Redness: yes Swelling: yes Oozing: yes Pus: yes Fevers: no Nausea/vomiting: no Status: worse Treatments attempted:none   Tetanus: UTD  Relevant past medical, surgical, family and social history reviewed and updated as indicated. Interim medical history since our last visit reviewed. Allergies and medications reviewed and updated.  Review of Systems  Constitutional: Negative.   Respiratory: Negative.    Cardiovascular: Negative.   Gastrointestinal: Negative.   Musculoskeletal: Negative.   Skin:  Positive for wound. Negative for color change, pallor and rash.  Psychiatric/Behavioral: Negative.      Per HPI unless specifically indicated above     Objective:    BP (!) 152/64 (BP Location: Left Arm, Cuff Size: Small)   Pulse 86   Temp (!) 97.3 F (36.3 C) (Oral)   Wt 101 lb (45.8 kg)   LMP  (LMP Unknown)   SpO2 96%   BMI 20.50 kg/m   Wt Readings from Last 3 Encounters:  03/05/23 101 lb (45.8 kg)  12/03/22 102 lb 3.2 oz (46.4 kg)  11/26/22 96 lb (43.5 kg)    Physical Exam Vitals and nursing note reviewed.  Constitutional:      General: She is not in acute distress.    Appearance: Normal appearance. She is not ill-appearing, toxic-appearing or diaphoretic.  HENT:     Head: Normocephalic and atraumatic.     Right Ear: External ear normal.      Left Ear: External ear normal.     Nose: Nose normal.     Mouth/Throat:     Mouth: Mucous membranes are moist.     Pharynx: Oropharynx is clear.  Eyes:     General: No scleral icterus.       Right eye: No discharge.        Left eye: No discharge.     Extraocular Movements: Extraocular movements intact.     Conjunctiva/sclera: Conjunctivae normal.     Pupils: Pupils are equal, round, and reactive to light.  Cardiovascular:     Rate and Rhythm: Normal rate and regular rhythm.     Pulses: Normal pulses.     Heart sounds: Normal heart sounds. No murmur heard.    No friction rub. No gallop.  Pulmonary:     Effort: Pulmonary effort is normal. No respiratory distress.     Breath sounds: Normal breath sounds. No stridor. No wheezing, rhonchi or rales.  Chest:     Chest wall: No tenderness.  Musculoskeletal:        General: Normal range of motion.     Cervical back: Normal range of motion and neck supple.  Skin:    General: Skin is warm and dry.     Capillary Refill: Capillary refill  takes less than 2 seconds.     Coloration: Skin is not jaundiced or pale.     Findings: No bruising, erythema, lesion or rash.     Comments: 3 inch oozing wound on R side of her abdomen with sebaceous material and odor  Neurological:     General: No focal deficit present.     Mental Status: She is alert and oriented to person, place, and time. Mental status is at baseline.  Psychiatric:        Mood and Affect: Mood normal.        Behavior: Behavior normal.        Thought Content: Thought content normal.        Judgment: Judgment normal.     Results for orders placed or performed during the hospital encounter of 11/27/22  CBG monitoring, ED   Collection Time: 11/26/22  7:28 PM  Result Value Ref Range   Glucose-Capillary 129 (H) 70 - 99 mg/dL   Comment 1 Notify RN   Resp panel by RT-PCR (RSV, Flu A&B, Covid) Anterior Nasal Swab   Collection Time: 11/26/22  7:46 PM   Specimen: Anterior Nasal  Swab  Result Value Ref Range   SARS Coronavirus 2 by RT PCR POSITIVE (A) NEGATIVE   Influenza A by PCR NEGATIVE NEGATIVE   Influenza B by PCR NEGATIVE NEGATIVE   Resp Syncytial Virus by PCR NEGATIVE NEGATIVE  CBC with Differential   Collection Time: 11/26/22  8:13 PM  Result Value Ref Range   WBC 8.4 4.0 - 10.5 K/uL   RBC 4.48 3.87 - 5.11 MIL/uL   Hemoglobin 13.6 12.0 - 15.0 g/dL   HCT 16.1 09.6 - 04.5 %   MCV 92.0 80.0 - 100.0 fL   MCH 30.4 26.0 - 34.0 pg   MCHC 33.0 30.0 - 36.0 g/dL   RDW 40.9 81.1 - 91.4 %   Platelets 127 (L) 150 - 400 K/uL   nRBC 0.0 0.0 - 0.2 %   Neutrophils Relative % 75 %   Neutro Abs 6.2 1.7 - 7.7 K/uL   Lymphocytes Relative 12 %   Lymphs Abs 1.0 0.7 - 4.0 K/uL   Monocytes Relative 13 %   Monocytes Absolute 1.1 (H) 0.1 - 1.0 K/uL   Eosinophils Relative 0 %   Eosinophils Absolute 0.0 0.0 - 0.5 K/uL   Basophils Relative 0 %   Basophils Absolute 0.0 0.0 - 0.1 K/uL   Immature Granulocytes 0 %   Abs Immature Granulocytes 0.02 0.00 - 0.07 K/uL  Comprehensive metabolic panel   Collection Time: 11/26/22  8:13 PM  Result Value Ref Range   Sodium 133 (L) 135 - 145 mmol/L   Potassium 4.0 3.5 - 5.1 mmol/L   Chloride 103 98 - 111 mmol/L   CO2 24 22 - 32 mmol/L   Glucose, Bld 144 (H) 70 - 99 mg/dL   BUN 16 8 - 23 mg/dL   Creatinine, Ser 7.82 0.44 - 1.00 mg/dL   Calcium 8.9 8.9 - 95.6 mg/dL   Total Protein 6.7 6.5 - 8.1 g/dL   Albumin 3.8 3.5 - 5.0 g/dL   AST 39 15 - 41 U/L   ALT 37 0 - 44 U/L   Alkaline Phosphatase 84 38 - 126 U/L   Total Bilirubin 0.7 <1.2 mg/dL   GFR, Estimated >21 >30 mL/min   Anion gap 6 5 - 15  Troponin I (High Sensitivity)   Collection Time: 11/26/22  8:13 PM  Result Value Ref Range  Troponin I (High Sensitivity) 19 (H) <18 ng/L  Urinalysis, Routine w reflex microscopic -Urine, Clean Catch   Collection Time: 11/27/22 12:37 AM  Result Value Ref Range   Color, Urine YELLOW (A) YELLOW   APPearance HAZY (A) CLEAR   Specific  Gravity, Urine 1.019 1.005 - 1.030   pH 5.0 5.0 - 8.0   Glucose, UA NEGATIVE NEGATIVE mg/dL   Hgb urine dipstick NEGATIVE NEGATIVE   Bilirubin Urine NEGATIVE NEGATIVE   Ketones, ur 5 (A) NEGATIVE mg/dL   Protein, ur NEGATIVE NEGATIVE mg/dL   Nitrite NEGATIVE NEGATIVE   Leukocytes,Ua TRACE (A) NEGATIVE   RBC / HPF 0-5 0 - 5 RBC/hpf   WBC, UA 6-10 0 - 5 WBC/hpf   Bacteria, UA RARE (A) NONE SEEN   Squamous Epithelial / HPF 0-5 0 - 5 /HPF   Mucus PRESENT   Culture, blood (routine x 2)   Collection Time: 11/27/22  2:04 AM   Specimen: BLOOD  Result Value Ref Range   Specimen Description BLOOD LEFT    Special Requests      BOTTLES DRAWN AEROBIC AND ANAEROBIC Blood Culture adequate volume   Culture      NO GROWTH 5 DAYS Performed at [facility], [address] [place], [place] [address]    Report Status 12/02/2022 FINAL   Culture, blood (routine x 2)   Collection Time: 11/27/22  2:41 AM   Specimen: BLOOD  Result Value Ref Range   Specimen Description BLOOD BLOOD RIGHT FOREARM    Special Requests      BOTTLES DRAWN AEROBIC AND ANAEROBIC Blood Culture results may not be optimal due to an excessive volume of blood received in culture bottles   Culture      NO GROWTH 5 DAYS Performed at [facility], [address] [place], [place] [address]    Report Status 12/02/2022 FINAL   Lactic acid, plasma   Collection Time: 11/27/22  2:42 AM  Result Value Ref Range   Lactic Acid, Venous 1.1 0.5 - 1.9 mmol/L  Troponin I (High Sensitivity)   Collection Time: 11/27/22  2:42 AM  Result Value Ref Range   Troponin I (High Sensitivity) 23 (H) <18 ng/L  Lactic acid, plasma   Collection Time: 11/27/22  5:02 AM  Result Value Ref Range   Lactic Acid, Venous 1.5 0.5 - 1.9 mmol/L  Basic metabolic panel   Collection Time: 11/27/22  5:02 AM  Result Value Ref Range   Sodium 137 135 - 145 mmol/L   Potassium 3.8 3.5 - 5.1 mmol/L   Chloride 107 98 - 111 mmol/L   CO2 22 22 - 32  mmol/L   Glucose, Bld 190 (H) 70 - 99 mg/dL   BUN 13 8 - 23 mg/dL   Creatinine, Ser 9.62 0.44 - 1.00 mg/dL   Calcium 8.1 (L) 8.9 - 10.3 mg/dL   GFR, Estimated >95 >28 mL/min   Anion gap 8 5 - 15  CBC   Collection Time: 11/27/22  5:02 AM  Result Value Ref Range   WBC 6.1 4.0 - 10.5 K/uL   RBC 3.93 3.87 - 5.11 MIL/uL   Hemoglobin 11.9 (L) 12.0 - 15.0 g/dL   HCT 41.3 24.4 - 01.0 %   MCV 93.1 80.0 - 100.0 fL   MCH 30.3 26.0 - 34.0 pg   MCHC 32.5 30.0 - 36.0 g/dL   RDW 27.2 53.6 - 64.4 %   Platelets 111 (L) 150 - 400 K/uL   nRBC 0.0  0.0 - 0.2 %  TSH   Collection Time: 11/27/22  5:02 AM  Result Value Ref Range   TSH 0.032 (L) 0.350 - 4.500 uIU/mL      Assessment & Plan:   Problem List Items Addressed This Visit   None Visit Diagnoses       Infected sebaceous cyst of skin    -  Primary   Removed today. Will treat with doxy. Packed. Return Monday for packing change.      Skin Procedure  Procedure: Informed consent given.  Area infiltrated with lidocaine with epinephrine.  Lesion completely removed and sent for pathology.  Diagnosis:   ICD-10-CM   1. Infected sebaceous cyst of skin  L72.3    L08.9    Removed today. Will treat with doxy. Packed. Return Monday for packing change.      Lesion Location/Size: 3 inch oozing wound on R side of her abdomen with sebaceous material and odor  Physician: MJ Consent:  Risks, benefits, and alternative treatments discussed and all questions were answered.  Patient elected to proceed and verbal consent obtained.  Description: Area prepped and draped using semi-sterile technique. Area locally anesthetized using 3 cc's of lidocaine 1% with epi. Already open and oozing cyst cavity was cleaned and copious amount of cheesy material expressed.  Cyst wall was removed in pieces using mosquito hemostat. Packed with iodiform and dressed.  Post Procedure Instructions:  Wound care instructions discussed and patient was instructed to keep area clean  and dry.  Signs and symptoms of infection discussed, patient agrees to contact the office ASAP should they occur.  Dressing change recommended when she returns on Monday.   Follow up plan: Return Monday for dressing change, OK to double book.

## 2023-03-05 NOTE — Telephone Encounter (Signed)
 Called and LVM notifying patient's daughter of Dr. Henriette Combs message. Asked for patient's daughter to please call us back if the patient is able to come in at 2:40 today.

## 2023-03-05 NOTE — Telephone Encounter (Signed)
 FYI for appointment scheduled.

## 2023-03-06 NOTE — Patient Instructions (Signed)

## 2023-03-08 ENCOUNTER — Ambulatory Visit: Payer: Medicare PPO | Admitting: Family Medicine

## 2023-03-08 ENCOUNTER — Ambulatory Visit (INDEPENDENT_AMBULATORY_CARE_PROVIDER_SITE_OTHER): Payer: Medicare PPO | Admitting: Nurse Practitioner

## 2023-03-08 ENCOUNTER — Encounter: Payer: Self-pay | Admitting: Nurse Practitioner

## 2023-03-08 VITALS — BP 124/66 | HR 52 | Temp 97.9°F | Wt 101.0 lb

## 2023-03-08 DIAGNOSIS — L723 Sebaceous cyst: Secondary | ICD-10-CM | POA: Diagnosis not present

## 2023-03-08 NOTE — Assessment & Plan Note (Signed)
 Abdomen, left lower.  Repacked area today.  Mild odor present, will continue Doxcycline and may extend next visit and any s/s infection.  Mild tenderness with dressing change present.  Overall tolerated well.

## 2023-03-08 NOTE — Progress Notes (Signed)
 BP 124/66   Pulse (!) 52   Temp 97.9 F (36.6 C) (Oral)   Wt 101 lb (45.8 kg)   LMP  (LMP Unknown)   SpO2 100%   BMI 20.50 kg/m    Subjective:    Patient ID: Kirsten Riley, female    DOB: [birth year], 89 y.o.   MRN: [id number]  HPI: Kirsten Riley is a 89 y.o. female  Chief Complaint  Patient presents with  . Wound Check    Cyst removed 03/05/23 from abdomen, wound was packed    SKIN INFECTION To left lower abdomen, cyst removed 03/05/23.  Currently on Doxycycline. Duration: days Location: left lower abdomen History of trauma in area: no Pain: yes Quality: yes Severity: mild Redness: yes Swelling: no Oozing: some drainage to dressing Pus: no Fevers: no Nausea/vomiting: no Status: stable Treatments attempted:antibiotics and removal  Tetanus: UTD   Relevant past medical, surgical, family and social history reviewed and updated as indicated. Interim medical history since our last visit reviewed. Allergies and medications reviewed and updated.  Review of Systems  Constitutional:  Negative for activity change, appetite change, diaphoresis, fatigue and fever.  Respiratory:  Negative for cough, chest tightness and shortness of breath.   Cardiovascular:  Negative for chest pain, palpitations and leg swelling.  Gastrointestinal: Negative.   Endocrine: Negative.   Skin:  Positive for wound.  Neurological: Negative.   Psychiatric/Behavioral: Negative.    All other systems reviewed and are negative.   Per HPI unless specifically indicated above     Objective:    BP 124/66   Pulse (!) 52   Temp 97.9 F (36.6 C) (Oral)   Wt 101 lb (45.8 kg)   LMP  (LMP Unknown)   SpO2 100%   BMI 20.50 kg/m   Wt Readings from Last 3 Encounters:  03/08/23 101 lb (45.8 kg)  03/05/23 101 lb (45.8 kg)  12/03/22 102 lb 3.2 oz (46.4 kg)    Physical Exam Vitals and nursing note reviewed.  Constitutional:      General: She is awake. She is not in acute distress.    Appearance: She is  well-developed and well-groomed. She is not ill-appearing or toxic-appearing.  HENT:     Head: Normocephalic.     Right Ear: Hearing and external ear normal.     Left Ear: Hearing and external ear normal.  Eyes:     General: Lids are normal.        Right eye: No discharge.        Left eye: No discharge.     Conjunctiva/sclera: Conjunctivae normal.     Pupils: Pupils are equal, round, and reactive to light.  Neck:     Thyroid: No thyromegaly.     Vascular: No carotid bruit.  Cardiovascular:     Rate and Rhythm: Normal rate and regular rhythm.     Heart sounds: Normal heart sounds. No murmur heard.    No gallop.  Pulmonary:     Effort: Pulmonary effort is normal. No accessory muscle usage or respiratory distress.     Breath sounds: Normal breath sounds.  Abdominal:     General: Bowel sounds are normal. There is no distension.     Palpations: Abdomen is soft.     Tenderness: There is no abdominal tenderness.  Musculoskeletal:     Cervical back: Normal range of motion and neck supple.     Right lower leg: No edema.     Left lower leg: No edema.  Lymphadenopathy:     Cervical: No cervical adenopathy.  Skin:    General: Skin is warm and dry.     Findings: Wound present.       Neurological:     Mental Status: She is alert and oriented to person, place, and time.     Deep Tendon Reflexes: Reflexes are normal and symmetric.     Reflex Scores:      Brachioradialis reflexes are 2+ on the right side and 2+ on the left side.      Patellar reflexes are 2+ on the right side and 2+ on the left side. Psychiatric:        Attention and Perception: Attention normal.        Mood and Affect: Mood normal.        Speech: Speech normal.        Behavior: Behavior normal. Behavior is cooperative.        Thought Content: Thought content normal.    Results for orders placed or performed during the hospital encounter of 11/27/22  CBG monitoring, ED   Collection Time: 11/26/22  7:28 PM  Result  Value Ref Range   Glucose-Capillary 129 (H) 70 - 99 mg/dL   Comment 1 Notify RN   Resp panel by RT-PCR (RSV, Flu A&B, Covid) Anterior Nasal Swab   Collection Time: 11/26/22  7:46 PM   Specimen: Anterior Nasal Swab  Result Value Ref Range   SARS Coronavirus 2 by RT PCR POSITIVE (A) NEGATIVE   Influenza A by PCR NEGATIVE NEGATIVE   Influenza B by PCR NEGATIVE NEGATIVE   Resp Syncytial Virus by PCR NEGATIVE NEGATIVE  CBC with Differential   Collection Time: 11/26/22  8:13 PM  Result Value Ref Range   WBC 8.4 4.0 - 10.5 K/uL   RBC 4.48 3.87 - 5.11 MIL/uL   Hemoglobin 13.6 12.0 - 15.0 g/dL   HCT 44.0 10.2 - 72.5 %   MCV 92.0 80.0 - 100.0 fL   MCH 30.4 26.0 - 34.0 pg   MCHC 33.0 30.0 - 36.0 g/dL   RDW 36.6 44.0 - 34.7 %   Platelets 127 (L) 150 - 400 K/uL   nRBC 0.0 0.0 - 0.2 %   Neutrophils Relative % 75 %   Neutro Abs 6.2 1.7 - 7.7 K/uL   Lymphocytes Relative 12 %   Lymphs Abs 1.0 0.7 - 4.0 K/uL   Monocytes Relative 13 %   Monocytes Absolute 1.1 (H) 0.1 - 1.0 K/uL   Eosinophils Relative 0 %   Eosinophils Absolute 0.0 0.0 - 0.5 K/uL   Basophils Relative 0 %   Basophils Absolute 0.0 0.0 - 0.1 K/uL   Immature Granulocytes 0 %   Abs Immature Granulocytes 0.02 0.00 - 0.07 K/uL  Comprehensive metabolic panel   Collection Time: 11/26/22  8:13 PM  Result Value Ref Range   Sodium 133 (L) 135 - 145 mmol/L   Potassium 4.0 3.5 - 5.1 mmol/L   Chloride 103 98 - 111 mmol/L   CO2 24 22 - 32 mmol/L   Glucose, Bld 144 (H) 70 - 99 mg/dL   BUN 16 8 - 23 mg/dL   Creatinine, Ser 4.25 0.44 - 1.00 mg/dL   Calcium 8.9 8.9 - 95.6 mg/dL   Total Protein 6.7 6.5 - 8.1 g/dL   Albumin 3.8 3.5 - 5.0 g/dL   AST 39 15 - 41 U/L   ALT 37 0 - 44 U/L   Alkaline Phosphatase 84 38 - 126 U/L  Total Bilirubin 0.7 <1.2 mg/dL   GFR, Estimated >16 >10 mL/min   Anion gap 6 5 - 15  Troponin I (High Sensitivity)   Collection Time: 11/26/22  8:13 PM  Result Value Ref Range   Troponin I (High Sensitivity) 19 (H)  <18 ng/L  Urinalysis, Routine w reflex microscopic -Urine, Clean Catch   Collection Time: 11/27/22 12:37 AM  Result Value Ref Range   Color, Urine YELLOW (A) YELLOW   APPearance HAZY (A) CLEAR   Specific Gravity, Urine 1.019 1.005 - 1.030   pH 5.0 5.0 - 8.0   Glucose, UA NEGATIVE NEGATIVE mg/dL   Hgb urine dipstick NEGATIVE NEGATIVE   Bilirubin Urine NEGATIVE NEGATIVE   Ketones, ur 5 (A) NEGATIVE mg/dL   Protein, ur NEGATIVE NEGATIVE mg/dL   Nitrite NEGATIVE NEGATIVE   Leukocytes,Ua TRACE (A) NEGATIVE   RBC / HPF 0-5 0 - 5 RBC/hpf   WBC, UA 6-10 0 - 5 WBC/hpf   Bacteria, UA RARE (A) NONE SEEN   Squamous Epithelial / HPF 0-5 0 - 5 /HPF   Mucus PRESENT   Culture, blood (routine x 2)   Collection Time: 11/27/22  2:04 AM   Specimen: BLOOD  Result Value Ref Range   Specimen Description BLOOD LEFT    Special Requests      BOTTLES DRAWN AEROBIC AND ANAEROBIC Blood Culture adequate volume   Culture      NO GROWTH 5 DAYS Performed at [facility], [address] [place], [place] [address]    Report Status 12/02/2022 FINAL   Culture, blood (routine x 2)   Collection Time: 11/27/22  2:41 AM   Specimen: BLOOD  Result Value Ref Range   Specimen Description BLOOD BLOOD RIGHT FOREARM    Special Requests      BOTTLES DRAWN AEROBIC AND ANAEROBIC Blood Culture results may not be optimal due to an excessive volume of blood received in culture bottles   Culture      NO GROWTH 5 DAYS Performed at [facility] [address] [place], [place] [address]    Report Status 12/02/2022 FINAL   Lactic acid, plasma   Collection Time: 11/27/22  2:42 AM  Result Value Ref Range   Lactic Acid, Venous 1.1 0.5 - 1.9 mmol/L  Troponin I (High Sensitivity)   Collection Time: 11/27/22  2:42 AM  Result Value Ref Range   Troponin I (High Sensitivity) 23 (H) <18 ng/L  Lactic acid, plasma   Collection Time: 11/27/22  5:02 AM  Result Value Ref Range   Lactic Acid, Venous 1.5 0.5 - 1.9  mmol/L  Basic metabolic panel   Collection Time: 11/27/22  5:02 AM  Result Value Ref Range   Sodium 137 135 - 145 mmol/L   Potassium 3.8 3.5 - 5.1 mmol/L   Chloride 107 98 - 111 mmol/L   CO2 22 22 - 32 mmol/L   Glucose, Bld 190 (H) 70 - 99 mg/dL   BUN 13 8 - 23 mg/dL   Creatinine, Ser 1.91 0.44 - 1.00 mg/dL   Calcium 8.1 (L) 8.9 - 10.3 mg/dL   GFR, Estimated >47 >82 mL/min   Anion gap 8 5 - 15  CBC   Collection Time: 11/27/22  5:02 AM  Result Value Ref Range   WBC 6.1 4.0 - 10.5 K/uL   RBC 3.93 3.87 - 5.11 MIL/uL   Hemoglobin 11.9 (L) 12.0 - 15.0 g/dL   HCT 95.6 21.3 - 08.6 %   MCV 93.1 80.0 -  100.0 fL   MCH 30.3 26.0 - 34.0 pg   MCHC 32.5 30.0 - 36.0 g/dL   RDW 82.9 56.2 - 13.0 %   Platelets 111 (L) 150 - 400 K/uL   nRBC 0.0 0.0 - 0.2 %  TSH   Collection Time: 11/27/22  5:02 AM  Result Value Ref Range   TSH 0.032 (L) 0.350 - 4.500 uIU/mL      Assessment & Plan:   Problem List Items Addressed This Visit       Musculoskeletal and Integument   Sebaceous cyst - Primary   Abdomen, left lower.  Repacked area today.  Mild odor present, will continue Doxcycline and may extend next visit and any s/s infection.  Mild tenderness with dressing change present.  Overall tolerated well.          Follow up plan: Return in about 2 days (around 03/10/2023) for Wound check -- may overbook.

## 2023-03-09 LAB — SURGICAL PATHOLOGY

## 2023-03-10 ENCOUNTER — Ambulatory Visit (INDEPENDENT_AMBULATORY_CARE_PROVIDER_SITE_OTHER): Payer: Medicare PPO | Admitting: Nurse Practitioner

## 2023-03-10 ENCOUNTER — Encounter: Payer: Self-pay | Admitting: Nurse Practitioner

## 2023-03-10 VITALS — BP 147/59 | HR 54 | Temp 98.7°F | Ht 58.8 in | Wt 101.0 lb

## 2023-03-10 DIAGNOSIS — L723 Sebaceous cyst: Secondary | ICD-10-CM

## 2023-03-10 NOTE — Progress Notes (Signed)
 BP (!) 147/59   Pulse (!) 54   Temp 98.7 F (37.1 C) (Oral)   Ht 4' 10.8" (1.494 m)   Wt 101 lb (45.8 kg)   LMP  (LMP Unknown)   SpO2 98%   BMI 20.54 kg/m    Subjective:    Patient ID: Kirsten Riley, female    DOB: 1931/01/28, 89 y.o.   MRN: [id number]  HPI: Kirsten Riley is a 89 y.o. female  Chief Complaint  Patient presents with   Wound Check   SKIN INFECTION To left lower abdomen, cyst removed 03/05/23.  Currently on Doxycycline, about completed.  Reports less pain to area. Duration: days Location: left lower abdomen History of trauma in area: no Pain: yes Quality: yes Severity: mild Redness: yes Swelling: no Oozing: some drainage to dressing Pus: no Fevers: no Nausea/vomiting: no Status: stable Treatments attempted:antibiotics and removal  Tetanus: UTD   Relevant past medical, surgical, family and social history reviewed and updated as indicated. Interim medical history since our last visit reviewed. Allergies and medications reviewed and updated.  Review of Systems  Constitutional:  Negative for activity change, appetite change, diaphoresis, fatigue and fever.  Respiratory:  Negative for cough, chest tightness and shortness of breath.   Cardiovascular:  Negative for chest pain, palpitations and leg swelling.  Gastrointestinal: Negative.   Endocrine: Negative.   Skin:  Positive for wound.  Neurological: Negative.   Psychiatric/Behavioral: Negative.    All other systems reviewed and are negative.   Per HPI unless specifically indicated above     Objective:    BP (!) 147/59   Pulse (!) 54   Temp 98.7 F (37.1 C) (Oral)   Ht 4' 10.8" (1.494 m)   Wt 101 lb (45.8 kg)   LMP  (LMP Unknown)   SpO2 98%   BMI 20.54 kg/m   Wt Readings from Last 3 Encounters:  03/10/23 101 lb (45.8 kg)  03/08/23 101 lb (45.8 kg)  03/05/23 101 lb (45.8 kg)    Physical Exam Vitals and nursing note reviewed.  Constitutional:      General: She is awake. She is not in  acute distress.    Appearance: She is well-developed and well-groomed. She is not ill-appearing or toxic-appearing.  HENT:     Head: Normocephalic.     Right Ear: Hearing and external ear normal.     Left Ear: Hearing and external ear normal.  Eyes:     General: Lids are normal.        Right eye: No discharge.        Left eye: No discharge.     Conjunctiva/sclera: Conjunctivae normal.     Pupils: Pupils are equal, round, and reactive to light.  Neck:     Thyroid: No thyromegaly.     Vascular: No carotid bruit.  Cardiovascular:     Rate and Rhythm: Normal rate and regular rhythm.     Heart sounds: Normal heart sounds. No murmur heard.    No gallop.  Pulmonary:     Effort: Pulmonary effort is normal. No accessory muscle usage or respiratory distress.     Breath sounds: Normal breath sounds.  Abdominal:     General: Bowel sounds are normal. There is no distension.     Palpations: Abdomen is soft.     Tenderness: There is no abdominal tenderness.  Musculoskeletal:     Cervical back: Normal range of motion and neck supple.     Right lower leg: No  edema.     Left lower leg: No edema.  Lymphadenopathy:     Cervical: No cervical adenopathy.  Skin:    General: Skin is warm and dry.     Findings: Wound present.       Neurological:     Mental Status: She is alert and oriented to person, place, and time.     Deep Tendon Reflexes: Reflexes are normal and symmetric.     Reflex Scores:      Brachioradialis reflexes are 2+ on the right side and 2+ on the left side.      Patellar reflexes are 2+ on the right side and 2+ on the left side. Psychiatric:        Attention and Perception: Attention normal.        Mood and Affect: Mood normal.        Speech: Speech normal.        Behavior: Behavior normal. Behavior is cooperative.        Thought Content: Thought content normal.     Results for orders placed or performed in visit on 03/05/23  Surgical pathology   Collection Time:  03/05/23  4:02 PM  Result Value Ref Range   SURGICAL PATHOLOGY      SURGICAL PATHOLOGY CASE: [id number] PATIENT: Kirsten Riley Surgical Pathology Report     Clinical History: suspect sebaceous material, but was open and oozing when she came in, 3 inch oozing wound on right side of abdomen (cm)     FINAL MICROSCOPIC DIAGNOSIS:  A. RIGHT SIDE ABDOMEN, SOFT TISSUE, EXCISION: Laminated keratinous debris consistent with epithelial inclusion cyst contents Negative for cyst wall   GROSS DESCRIPTION:  Received fresh is a 1.1 x 0.6 x 0.1 cm fragment of white-tan soft tissue, possibly consistent with a cyst wall.  The fragment is bisected revealing an underlying white-tan cut surface.  Also received in the container is an ample amount of white-tan, soft material, possibly consistent with cystic contents.  The specimen is entirely submitted in 1 block. (KW, 03/08/2023)  Final Diagnosis performed by Jerene Bears, MD.   Electronically signed 03/09/2023 Technical and / or Professional components performed at [facility]. [facility], [address] [place], [place] [address].  Immunohistochemistry Technical component (if applicable) was performed at [facility]. [address], [place], [place] [address].   IMMUNOHISTOCHEMISTRY DISCLAIMER (if applicable): Some of these immunohistochemical stains may have been developed and the performance characteristics determine by [facility]. Some may not have been cleared or approved by the U.S. Food and Drug Administration. The FDA has determined that such clearance or approval is not necessary. This test is used for clinical purposes. It should not be regarded as investigational or for research. This laboratory is certified under the Clinical Laboratory Improvement Amendments of 1988 (CLIA-88) as qualified to perform high complexity clinical laboratory testing.  The controls stained  appropriately.   IHC stains are performed on formalin fixed, paraffin embedded tissue using a 3,3"diaminobenzidine (DAB) chromogen  and Leica Bond Autostainer System. The staining intensity of the nucleus is score manually and is reported as the percentage of tumor cell nuclei demonstrating specific nuclear staining. The specimens are fixed in 10% Neutral Formalin for at least 6 hours and up to 72hrs. These tests are validated on decalcified tissue. Results should be interpreted with caution given the possibility of false negative results on decalcified specimens. Antibody Clones are as follows ER-clone 37F, PR-clone 16, Ki67- clone MM1. Some  of these immunohistochemical stains may have been developed and the performance characteristics determined by [facility].       Assessment & Plan:   Problem List Items Addressed This Visit       Musculoskeletal and Integument   Sebaceous cyst - Primary   Abdomen, left lower.  Repacked area today.  Overall improving on exam today, less depth.  No tenderness with dressing change.  Overall tolerated well.  Almost completed Doxycycline.  Return in 2 days.         Follow up plan: Return in about 2 days (around 03/12/2023) for Wound check.

## 2023-03-10 NOTE — Patient Instructions (Signed)

## 2023-03-10 NOTE — Assessment & Plan Note (Signed)
 Abdomen, left lower.  Repacked area today.  Overall improving on exam today, less depth.  No tenderness with dressing change.  Overall tolerated well.  Almost completed Doxycycline.  Return in 2 days.

## 2023-03-12 ENCOUNTER — Inpatient Hospital Stay: Payer: Medicare PPO

## 2023-03-12 ENCOUNTER — Encounter: Payer: Self-pay | Admitting: Nurse Practitioner

## 2023-03-12 ENCOUNTER — Other Ambulatory Visit: Payer: Self-pay

## 2023-03-12 ENCOUNTER — Encounter
Admission: EM | Disposition: A | Discharge status: Home Health | Payer: Self-pay | Source: Home / Self Care | Attending: Internal Medicine

## 2023-03-12 ENCOUNTER — Ambulatory Visit (INDEPENDENT_AMBULATORY_CARE_PROVIDER_SITE_OTHER): Payer: Medicare PPO | Admitting: Nurse Practitioner

## 2023-03-12 ENCOUNTER — Inpatient Hospital Stay
Admission: EM | Admit: 2023-03-12 | Discharge: 2023-03-14 | DRG: 321 | Disposition: A | Discharge status: Home Health | Payer: Medicare PPO | Attending: Student in an Organized Health Care Education/Training Program | Admitting: Student in an Organized Health Care Education/Training Program

## 2023-03-12 VITALS — BP 138/66 | HR 54 | Temp 97.8°F | Ht <= 58 in | Wt 101.0 lb

## 2023-03-12 DIAGNOSIS — Z882 Allergy status to sulfonamides status: Secondary | ICD-10-CM | POA: Diagnosis not present

## 2023-03-12 DIAGNOSIS — M353 Polymyalgia rheumatica: Secondary | ICD-10-CM | POA: Diagnosis present

## 2023-03-12 DIAGNOSIS — I252 Old myocardial infarction: Secondary | ICD-10-CM | POA: Diagnosis present

## 2023-03-12 DIAGNOSIS — R0902 Hypoxemia: Secondary | ICD-10-CM | POA: Diagnosis not present

## 2023-03-12 DIAGNOSIS — R220 Localized swelling, mass and lump, head: Secondary | ICD-10-CM | POA: Diagnosis not present

## 2023-03-12 DIAGNOSIS — E1159 Type 2 diabetes mellitus with other circulatory complications: Secondary | ICD-10-CM | POA: Insufficient documentation

## 2023-03-12 DIAGNOSIS — I10 Essential (primary) hypertension: Secondary | ICD-10-CM | POA: Diagnosis not present

## 2023-03-12 DIAGNOSIS — W208XXA Other cause of strike by thrown, projected or falling object, initial encounter: Secondary | ICD-10-CM | POA: Diagnosis present

## 2023-03-12 DIAGNOSIS — Z823 Family history of stroke: Secondary | ICD-10-CM

## 2023-03-12 DIAGNOSIS — Z800 Family history of malignant neoplasm of digestive organs: Secondary | ICD-10-CM | POA: Diagnosis not present

## 2023-03-12 DIAGNOSIS — Z880 Allergy status to penicillin: Secondary | ICD-10-CM | POA: Diagnosis not present

## 2023-03-12 DIAGNOSIS — E119 Type 2 diabetes mellitus without complications: Secondary | ICD-10-CM | POA: Diagnosis not present

## 2023-03-12 DIAGNOSIS — Z7982 Long term (current) use of aspirin: Secondary | ICD-10-CM | POA: Diagnosis not present

## 2023-03-12 DIAGNOSIS — T82858A Stenosis of vascular prosthetic devices, implants and grafts, initial encounter: Principal | ICD-10-CM | POA: Diagnosis present

## 2023-03-12 DIAGNOSIS — Z8249 Family history of ischemic heart disease and other diseases of the circulatory system: Secondary | ICD-10-CM

## 2023-03-12 DIAGNOSIS — L723 Sebaceous cyst: Secondary | ICD-10-CM | POA: Diagnosis not present

## 2023-03-12 DIAGNOSIS — I2119 ST elevation (STEMI) myocardial infarction involving other coronary artery of inferior wall: Principal | ICD-10-CM | POA: Diagnosis present

## 2023-03-12 DIAGNOSIS — Z79899 Other long term (current) drug therapy: Secondary | ICD-10-CM

## 2023-03-12 DIAGNOSIS — Z809 Family history of malignant neoplasm, unspecified: Secondary | ICD-10-CM | POA: Diagnosis not present

## 2023-03-12 DIAGNOSIS — R55 Syncope and collapse: Secondary | ICD-10-CM | POA: Diagnosis not present

## 2023-03-12 DIAGNOSIS — Z803 Family history of malignant neoplasm of breast: Secondary | ICD-10-CM

## 2023-03-12 DIAGNOSIS — R079 Chest pain, unspecified: Secondary | ICD-10-CM | POA: Diagnosis not present

## 2023-03-12 DIAGNOSIS — Z8041 Family history of malignant neoplasm of ovary: Secondary | ICD-10-CM

## 2023-03-12 DIAGNOSIS — I213 ST elevation (STEMI) myocardial infarction of unspecified site: Secondary | ICD-10-CM | POA: Diagnosis not present

## 2023-03-12 DIAGNOSIS — E7800 Pure hypercholesterolemia, unspecified: Secondary | ICD-10-CM | POA: Diagnosis not present

## 2023-03-12 DIAGNOSIS — E039 Hypothyroidism, unspecified: Secondary | ICD-10-CM | POA: Diagnosis present

## 2023-03-12 DIAGNOSIS — Z66 Do not resuscitate: Secondary | ICD-10-CM | POA: Diagnosis present

## 2023-03-12 DIAGNOSIS — Z79890 Hormone replacement therapy (postmenopausal): Secondary | ICD-10-CM

## 2023-03-12 DIAGNOSIS — I2510 Atherosclerotic heart disease of native coronary artery without angina pectoris: Secondary | ICD-10-CM | POA: Diagnosis not present

## 2023-03-12 DIAGNOSIS — S0083XA Contusion of other part of head, initial encounter: Secondary | ICD-10-CM

## 2023-03-12 DIAGNOSIS — R001 Bradycardia, unspecified: Secondary | ICD-10-CM | POA: Diagnosis not present

## 2023-03-12 DIAGNOSIS — R0789 Other chest pain: Secondary | ICD-10-CM | POA: Diagnosis not present

## 2023-03-12 HISTORY — PX: CORONARY/GRAFT ACUTE MI REVASCULARIZATION: CATH118305

## 2023-03-12 HISTORY — PX: LEFT HEART CATH AND CORONARY ANGIOGRAPHY: CATH118249

## 2023-03-12 LAB — POCT ACTIVATED CLOTTING TIME
Activated Clotting Time: 250 s
Activated Clotting Time: 740 s
Activated Clotting Time: 521 s

## 2023-03-12 LAB — HEMOGLOBIN A1C
Hgb A1c MFr Bld: 6.9 % — ABNORMAL HIGH (ref 4.8–5.6)
Mean Plasma Glucose: 151.33 mg/dL

## 2023-03-12 LAB — GLUCOSE, CAPILLARY
Glucose-Capillary: 149 mg/dL — ABNORMAL HIGH (ref 70–99)
Glucose-Capillary: 126 mg/dL — ABNORMAL HIGH (ref 70–99)

## 2023-03-12 SURGERY — CORONARY/GRAFT ACUTE MI REVASCULARIZATION
Anesthesia: Moderate Sedation

## 2023-03-12 MED ORDER — HEPARIN SODIUM (PORCINE) 1000 UNIT/ML IJ SOLN
INTRAMUSCULAR | Status: AC
  Filled 2023-03-12: qty 10

## 2023-03-12 MED ORDER — HEPARIN (PORCINE) IN NACL 1000-0.9 UT/500ML-% IV SOLN
INTRAVENOUS | Status: AC
  Filled 2023-03-12: qty 1000

## 2023-03-12 MED ORDER — ENOXAPARIN SODIUM 40 MG/0.4ML IJ SOSY
40.0000 mg | PREFILLED_SYRINGE | INTRAMUSCULAR | Status: DC
  Administered 2023-03-12 – 2023-03-13 (×2): 40 mg via SUBCUTANEOUS
  Filled 2023-03-12 (×2): qty 0.4

## 2023-03-12 MED ORDER — MIDAZOLAM HCL 2 MG/2ML IJ SOLN
INTRAMUSCULAR | Status: DC | PRN
  Administered 2023-03-12: .25 mg via INTRAVENOUS

## 2023-03-12 MED ORDER — CLOPIDOGREL BISULFATE 75 MG PO TABS
75.0000 mg | ORAL_TABLET | Freq: Every day | ORAL | Status: DC
  Administered 2023-03-13 – 2023-03-14 (×2): 75 mg via ORAL
  Filled 2023-03-12 (×3): qty 1

## 2023-03-12 MED ORDER — SODIUM CHLORIDE 0.9 % IV SOLN
INTRAVENOUS | Status: AC | PRN
  Administered 2023-03-12: 4 ug/kg/min via INTRAVENOUS

## 2023-03-12 MED ORDER — CHLORHEXIDINE GLUCONATE CLOTH 2 % EX PADS
6.0000 | MEDICATED_PAD | Freq: Every day | CUTANEOUS | Status: DC
  Administered 2023-03-13 – 2023-03-14 (×2): 6 via TOPICAL

## 2023-03-12 MED ORDER — HEPARIN (PORCINE) IN NACL 1000-0.9 UT/500ML-% IV SOLN
INTRAVENOUS | Status: DC | PRN
  Administered 2023-03-12: 500 mL

## 2023-03-12 MED ORDER — SODIUM CHLORIDE 0.9% FLUSH: 3.0000 mL | INTRAVENOUS | Status: DC | PRN

## 2023-03-12 MED ORDER — INSULIN ASPART 100 UNIT/ML IJ SOLN
0.0000 [IU] | Freq: Three times a day (TID) | INTRAMUSCULAR | Status: DC
  Administered 2023-03-13: 2 [IU] via SUBCUTANEOUS
  Administered 2023-03-13 (×2): 1 [IU] via SUBCUTANEOUS
  Administered 2023-03-14: 2 [IU] via SUBCUTANEOUS
  Filled 2023-03-12 (×4): qty 1

## 2023-03-12 MED ORDER — CANGRELOR BOLUS VIA INFUSION
INTRAVENOUS | Status: DC | PRN
  Administered 2023-03-12: 1389 ug via INTRAVENOUS

## 2023-03-12 MED ORDER — IOHEXOL 300 MG/ML  SOLN
INTRAMUSCULAR | Status: DC | PRN
  Administered 2023-03-12: 307 mL

## 2023-03-12 MED ORDER — FENTANYL CITRATE (PF) 100 MCG/2ML IJ SOLN
INTRAMUSCULAR | Status: DC | PRN
  Administered 2023-03-12: 12.5 ug via INTRAVENOUS

## 2023-03-12 MED ORDER — METOPROLOL SUCCINATE ER 25 MG PO TB24
25.0000 mg | ORAL_TABLET | Freq: Every day | ORAL | Status: DC
  Administered 2023-03-14: 25 mg via ORAL
  Filled 2023-03-12 (×3): qty 1

## 2023-03-12 MED ORDER — CLOPIDOGREL BISULFATE 75 MG PO TABS
ORAL_TABLET | ORAL | Status: DC | PRN
  Administered 2023-03-12: 600 mg via ORAL

## 2023-03-12 MED ORDER — MIDAZOLAM HCL 2 MG/2ML IJ SOLN
INTRAMUSCULAR | Status: AC
  Filled 2023-03-12: qty 2

## 2023-03-12 MED ORDER — CANGRELOR TETRASODIUM 50 MG IV SOLR
INTRAVENOUS | Status: AC
  Filled 2023-03-12: qty 50

## 2023-03-12 MED ORDER — LIDOCAINE HCL (PF) 1 % IJ SOLN
INTRAMUSCULAR | Status: DC | PRN
  Administered 2023-03-12: 5 mL

## 2023-03-12 MED ORDER — LIDOCAINE HCL 1 % IJ SOLN
INTRAMUSCULAR | Status: AC
  Filled 2023-03-12: qty 20

## 2023-03-12 MED ORDER — SODIUM CHLORIDE 0.9 % WEIGHT BASED INFUSION
1.0000 mL/kg/h | INTRAVENOUS | Status: AC
  Administered 2023-03-12: 1 mL/kg/h via INTRAVENOUS

## 2023-03-12 MED ORDER — HYDRALAZINE HCL 20 MG/ML IJ SOLN: 10.0000 mg | INTRAMUSCULAR | Status: AC | PRN

## 2023-03-12 MED ORDER — HEPARIN (PORCINE) IN NACL 1000-0.9 UT/500ML-% IV SOLN
INTRAVENOUS | Status: AC
  Filled 2023-03-12: qty 500

## 2023-03-12 MED ORDER — LEVOTHYROXINE SODIUM 75 MCG PO TABS
75.0000 ug | ORAL_TABLET | Freq: Every day | ORAL | Status: DC
Start: 2023-03-13 — End: 2023-03-14
  Administered 2023-03-13 – 2023-03-14 (×2): 75 ug via ORAL
  Filled 2023-03-12: qty 1
  Filled 2023-03-12: qty 3
  Filled 2023-03-12: qty 1

## 2023-03-12 MED ORDER — FENTANYL CITRATE (PF) 100 MCG/2ML IJ SOLN
INTRAMUSCULAR | Status: AC
  Filled 2023-03-12: qty 2

## 2023-03-12 MED ORDER — SODIUM CHLORIDE 0.9 % IV SOLN: 250.0000 mL | INTRAVENOUS | Status: AC | PRN

## 2023-03-12 MED ORDER — HEPARIN (PORCINE) IN NACL 2000-0.9 UNIT/L-% IV SOLN
INTRAVENOUS | Status: DC | PRN
  Administered 2023-03-12: 1000 mL

## 2023-03-12 MED ORDER — HEPARIN SODIUM (PORCINE) 1000 UNIT/ML IJ SOLN
INTRAMUSCULAR | Status: DC | PRN
  Administered 2023-03-12: 2500 [IU] via INTRAVENOUS
  Administered 2023-03-12: 2000 [IU] via INTRAVENOUS
  Administered 2023-03-12: 5000 [IU] via INTRAVENOUS

## 2023-03-12 MED ORDER — ASPIRIN 81 MG PO TBEC
81.0000 mg | DELAYED_RELEASE_TABLET | Freq: Every day | ORAL | Status: DC
  Administered 2023-03-13 – 2023-03-14 (×2): 81 mg via ORAL
  Filled 2023-03-12 (×2): qty 1

## 2023-03-12 MED ORDER — VERAPAMIL HCL 2.5 MG/ML IV SOLN
INTRAVENOUS | Status: DC | PRN
  Administered 2023-03-12: 2.5 mg via INTRAVENOUS

## 2023-03-12 MED ORDER — ASPIRIN 81 MG PO CHEW
81.0000 mg | CHEWABLE_TABLET | Freq: Every day | ORAL | Status: DC
  Filled 2023-03-12: qty 1

## 2023-03-12 MED ORDER — LABETALOL HCL 5 MG/ML IV SOLN: 10.0000 mg | INTRAVENOUS | Status: AC | PRN

## 2023-03-12 MED ORDER — NITROGLYCERIN 0.4 MG SL SUBL: 0.4000 mg | SUBLINGUAL_TABLET | SUBLINGUAL | Status: DC | PRN

## 2023-03-12 MED ORDER — ONDANSETRON HCL 4 MG/2ML IJ SOLN: 4.0000 mg | Freq: Four times a day (QID) | INTRAMUSCULAR | Status: DC | PRN

## 2023-03-12 MED ORDER — ATORVASTATIN CALCIUM 80 MG PO TABS
80.0000 mg | ORAL_TABLET | Freq: Every day | ORAL | Status: DC
  Administered 2023-03-12 – 2023-03-14 (×3): 80 mg via ORAL
  Filled 2023-03-12 (×3): qty 1

## 2023-03-12 MED ORDER — INSULIN ASPART 100 UNIT/ML IJ SOLN
0.0000 [IU] | Freq: Every day | INTRAMUSCULAR | Status: DC
Start: 2023-03-12 — End: 2023-03-14

## 2023-03-12 MED ORDER — VERAPAMIL HCL 2.5 MG/ML IV SOLN
INTRAVENOUS | Status: AC
  Filled 2023-03-12: qty 2

## 2023-03-12 MED ORDER — CLOPIDOGREL BISULFATE 75 MG PO TABS
ORAL_TABLET | ORAL | Status: AC
  Filled 2023-03-12: qty 8

## 2023-03-12 MED ORDER — ACETAMINOPHEN 325 MG PO TABS: 650.0000 mg | ORAL_TABLET | ORAL | Status: DC | PRN

## 2023-03-12 MED ORDER — SODIUM CHLORIDE 0.9% FLUSH
3.0000 mL | Freq: Two times a day (BID) | INTRAVENOUS | Status: DC
  Administered 2023-03-13 – 2023-03-14 (×3): 3 mL via INTRAVENOUS

## 2023-03-12 SURGICAL SUPPLY — 31 items
BALLN MINITREK RX 2.0X15 (BALLOONS) ×1 IMPLANT
BALLN SCOREFLEX 2.50X10 (BALLOONS) ×1 IMPLANT
BALLN ~~LOC~~ TREK NEO RX 2.25X15 (BALLOONS) ×1 IMPLANT
BALLN ~~LOC~~ TREK NEO RX 2.5X12 (BALLOONS) ×1 IMPLANT
BALLN ~~LOC~~ TREK NEO RX 3.0X12 (BALLOONS) ×1 IMPLANT
BALLN ~~LOC~~ TREK NEO RX 3.5X8 (BALLOONS) ×1 IMPLANT
BALLOON MINITREK RX 2.0X15 (BALLOONS) IMPLANT
BALLOON SCOREFLEX 2.50X10 (BALLOONS) IMPLANT
BALLOON ~~LOC~~ TREK NEO RX 2.25X15 (BALLOONS) IMPLANT
BALLOON ~~LOC~~ TREK NEO RX 2.5X12 (BALLOONS) IMPLANT
BALLOON ~~LOC~~ TREK NEO RX 3.0X12 (BALLOONS) IMPLANT
BALLOON ~~LOC~~ TREK NEO RX 3.5X8 (BALLOONS) IMPLANT
CATH INFINITI 5FR JL4 (CATHETERS) IMPLANT
CATH INFINITI JR4 5F (CATHETERS) IMPLANT
CATH SHOCKWAVE C2 2.5X12 (CATHETERS) IMPLANT
CATH VISTA GUIDE 6FR JR4 ECOPK (CATHETERS) IMPLANT
CATH VISTA GUIDE 6FR MPA1 MLPK (CATHETERS) IMPLANT
DEVICE RAD TR BAND REGULAR (VASCULAR PRODUCTS) IMPLANT
DRAPE BRACHIAL (DRAPES) IMPLANT
GLIDESHEATH SLEND SS 6F .021 (SHEATH) IMPLANT
GUIDEWIRE INQWIRE 1.5J.035X260 (WIRE) IMPLANT
INQWIRE 1.5J .035X260CM (WIRE) ×1 IMPLANT
KIT ENCORE 26 ADVANTAGE (KITS) IMPLANT
PACK CARDIAC CATH (CUSTOM PROCEDURE TRAY) ×1 IMPLANT
PROTECTION STATION PRESSURIZED (MISCELLANEOUS) ×1 IMPLANT
SET ATX-X65L (MISCELLANEOUS) IMPLANT
STATION PROTECTION PRESSURIZED (MISCELLANEOUS) IMPLANT
STENT ONYX FRONTIER 3.0X12 (Permanent Stent) IMPLANT
TUBING CIL FLEX 10 FLL-RA (TUBING) IMPLANT
WIRE ASAHI PROWATER 180CM (WIRE) IMPLANT
WIRE G HI TQ BMW 190 (WIRE) IMPLANT

## 2023-03-12 NOTE — Progress Notes (Signed)
   03/12/23 1800  Spiritual Encounters  Type of Visit Initial  Care provided to: Pt and family  Conversation partners present during encounter Physician  Referral source Code page  Reason for visit Code  OnCall Visit Yes   Chaplain received a code STEMI for the patient. Chaplain escorted family members to the cath Lab and waited with them. Chaplain offered comforting presence and words. Chaplain services remain available for spiritual and emotional support.

## 2023-03-12 NOTE — ED Provider Notes (Signed)
 [facility] Provider Note    None    (approximate)   History   Code STEMI   HPI  Kirsten Riley is a 89 y.o. 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  EKG in the field shows ST elevations, so EMS activated code STEMI.  The patient states that her chest pain is a bit better now.  She denies shortness of breath.  I reviewed the past medical records.  The patient was just seen at family medicine earlier today for a wound check.   Physical Exam   Triage Vital Signs: ED Triage Vitals [03/12/23 1654]  Encounter Vitals Group     BP 127/75     Systolic BP Percentile      Diastolic BP Percentile      Pulse Rate 80     Resp 16     Temp 98 F (36.7 C)     Temp src      SpO2 96 %     Weight 102 lb (46.3 kg)     Height 4\' 10"  (1.473 m)     Head Circumference      Peak Flow      Pain Score 5     Pain Loc      Pain Education      Exclude from Growth Chart     Most recent vital signs: Vitals:   03/12/23 1654  BP: 127/75  Pulse: 80  Resp: 16  Temp: 98 F (36.7 C)  SpO2: 96%     General: Awake, no distress.  CV:  Good peripheral perfusion.  Resp:  Normal effort.  Abd:  No distention.  Other:  No peripheral edema.   ED Results / Procedures / Treatments   Labs (all labs ordered are listed, but only abnormal results are displayed) Labs Reviewed - No data to display   EKG  ED ECG REPORT I, Dionne Bucy, the attending physician, personally viewed and interpreted this ECG.  Date: 03/12/2023 EKG Time: 1635 Rate: 79 Rhythm: normal sinus rhythm QRS Axis: normal Intervals: normal ST/T Wave abnormalities: Inferior STEMI Narrative Interpretation: STEMI    RADIOLOGY    PROCEDURES:  Critical Care performed: Yes, see critical care procedure note(s)  .Critical Care  Performed by: Dionne Bucy, MD Authorized by: Dionne Bucy,  MD   Critical care provider statement:    Critical care time (minutes):  20   Critical care time was exclusive of:  Separately billable procedures and treating other patients   Critical care was necessary to treat or prevent imminent or life-threatening deterioration of the following conditions:  Cardiac failure   Critical care was time spent personally by me on the following activities:  Development of treatment plan with patient or surrogate, discussions with consultants, evaluation of patient's response to treatment, examination of patient, ordering and review of laboratory studies, ordering and review of radiographic studies, ordering and performing treatments and interventions, pulse oximetry, re-evaluation of patient's condition, review of old charts and obtaining history from patient or surrogate   Care discussed with: admitting provider      MEDICATIONS ORDERED IN ED: Medications - No data to display   IMPRESSION / MDM / ASSESSMENT AND PLAN / ED COURSE  I reviewed the triage vital signs and the nursing notes.  89 year old female with PMH as noted above presents with acute onset of chest pain, syncope, and EMS  EKG showing STEMI.  Code STEMI was activated by EMS.  On arrival to the ED the patient was alert with normal vital signs.  Differential diagnosis includes, but is not limited to, STEMI, vasovagal syncope, cardiac dysrhythmia.  I consulted and discussed the case with Dr. Darrold Junker from cardiology who is taking the patient to the Cath Lab.  Patient's presentation is most consistent with acute presentation with potential threat to life or bodily function.  The patient is on the cardiac monitor to evaluate for evidence of arrhythmia and/or significant heart rate changes.   FINAL CLINICAL IMPRESSION(S) / ED DIAGNOSES   Final diagnoses:  ST elevation myocardial infarction (STEMI), unspecified artery (HCC)     Rx / DC Orders   ED Discharge Orders     None         Note:  This document was prepared using Dragon voice recognition software and may include unintentional dictation errors.    Dionne Bucy, MD 03/12/23 [phone]

## 2023-03-12 NOTE — Assessment & Plan Note (Signed)
 Continue metoprolol.

## 2023-03-12 NOTE — ED Notes (Signed)
 Parachos at bedtime.

## 2023-03-12 NOTE — Assessment & Plan Note (Signed)
 Abdomen, left lower.  Repacked area today, less packing needed.  Overall improving on exam today, less depth.  No tenderness with dressing change.  Overall tolerated well.  Almost completed Doxycycline.  Return on Monday.

## 2023-03-12 NOTE — Progress Notes (Signed)
 Pt arrived to unit from cath lab. Upon arrival, pt noted to have large hematoma on right chin area and abrasion on chest. MD notified and at the bedside to assess. Picture taken and placed in pts chart. Pt states something fell on her face and chest in the EMS truck. Ice pack applied to hematoma. New order for CT of the face ordered. Belongings include clothing and life alert. Belongings given to daughter to take home.

## 2023-03-12 NOTE — Consult Note (Signed)
 [facility] Cardiology  CARDIOLOGY CONSULT NOTE  Patient ID: Kirsten Riley MRN: [id number] DOB/AGE: [birth year] 89 y.o.  Admit date: 03/12/2023 Referring Physician [facility] Primary Physician  Primary Cardiologist  Reason for Consultation inferior STEMI  HPI: 89 year old female referred for evaluation of inferior STEMI.  The patient is status post CABG at age 6.  She was in her usual state of health until this afternoon when she experienced substernal chest pain and syncopal episode.  EMS was called, and upon arrival ECG revealed ST elevations in inferior leads and code STEMI was activated.  The patient was brought to [facility] ED where repeat ECG confirmed inferior ST elevations consistent with STEMI.  The patient was brought to the cardiac catheterization laboratory where coronary angiography revealed severe three-vessel coronary artery disease, patent LIMA to LAD, patent SVG to D1, and patent SVG to RCA with erupted 80% stenosis in the proximal segment of of the vein graft, and diffuse 95% stenosis at the anastomotic site.  The patient underwent PCI receiving DES in the proximal segment of the vein graft, and multiple balloon inflations in the distal segment unsuccessful attempt to deliver stent to the distal segment.  Review of systems complete and found to be negative unless listed above     Past Medical History:  Diagnosis Date   CAD (coronary artery disease)    Diabetes mellitus (HCC) 12/10/2014   Fractured rib    Hyperlipidemia    Hypertension    Hypothyroidism    Polymyalgia rheumatica (HCC) 06/08/2014    Past Surgical History:  Procedure Laterality Date   ABDOMINAL HYSTERECTOMY  1978   partial   CORONARY ARTERY BYPASS GRAFT     hypercholesterol     hypothyroid      Medications Prior to Admission  Medication Sig Dispense Refill Last Dose/Taking   cholecalciferol (VITAMIN D3) 25 MCG (1000 UNIT) tablet Take 2,000 Units by mouth daily. Take once a day      doxycycline (VIBRA-TABS) 100 MG  tablet Take 1 tablet (100 mg total) by mouth 2 (two) times daily. Take with food 20 tablet 0    levothyroxine (SYNTHROID) 75 MCG tablet Take 1 tablet (75 mcg total) by mouth daily before breakfast. 90 tablet 4    lovastatin (MEVACOR) 40 MG tablet Take 1 tablet (40 mg total) by mouth at bedtime. 90 tablet 4    Omega-3 Fatty Acids (FISH OIL) 300 MG CAPS Take by mouth.      Social History   Socioeconomic History   Marital status: Widowed    Spouse name: Not on file   Number of children: Not on file   Years of education: Not on file   Highest education level: 12th grade  Occupational History   Occupation: retired  Tobacco Use   Smoking status: Never   Smokeless tobacco: Never  Vaping Use   Vaping status: Never Used  Substance and Sexual Activity   Alcohol use: No    Alcohol/week: 0.0 standard drinks of alcohol   Drug use: No   Sexual activity: Never  Other Topics Concern   Not on file  Social History Narrative   Not on file   Social Drivers of Health   Financial Resource Strain: Low Risk  (07/07/2022)   Overall Financial Resource Strain (CARDIA)    Difficulty of Paying Living Expenses: Not hard at all  Food Insecurity: No Food Insecurity (11/27/2022)   Hunger Vital Sign    Worried About Running Out of Food in the Last Year: Never true  Ran Out of Food in the Last Year: Never true  Transportation Needs: No Transportation Needs (11/27/2022)   PRAPARE - Administrator, Civil Service (Medical): No    Lack of Transportation (Non-Medical): No  Physical Activity: Insufficiently Active (07/07/2022)   Exercise Vital Sign    Days of Exercise per Week: 3 days    Minutes of Exercise per Session: 30 min  Stress: No Stress Concern Present (07/07/2022)   [employer] of Occupational Health - Occupational Stress Questionnaire    Feeling of Stress : Not at all  Social Connections: Moderately Isolated (07/07/2022)   Social Connection and Isolation Panel [NHANES]     Frequency of Communication with Friends and Family: More than three times a week    Frequency of Social Gatherings with Friends and Family: Twice a week    Attends Religious Services: More than 4 times per year    Active Member of [employer] or Organizations: No    Attends Banker Meetings: Never    Marital Status: Widowed  Intimate Partner Violence: Not At Risk (11/27/2022)   Humiliation, Afraid, Rape, and Kick questionnaire    Fear of Current or Ex-Partner: No    Emotionally Abused: No    Physically Abused: No    Sexually Abused: No    Family History  Problem Relation Age of Onset   Heart disease Mother    Stroke Mother    Hypertension Mother    Cancer Father    Cancer Sister        breast, ovarian, pancreatic   Heart disease Sister        x2   Cancer Brother    Heart disease Brother    Cancer Brother    Heart disease Brother    Cancer Brother    Heart disease Brother       Review of systems complete and found to be negative unless listed above      PHYSICAL EXAM  General: Well developed, well nourished, in no acute distress HEENT:  Normocephalic and atramatic Neck:  No JVD.  Lungs: Clear bilaterally to auscultation and percussion. Heart: HRRR . Normal S1 and S2 without gallops or murmurs.  Abdomen: Bowel sounds are positive, abdomen soft and non-tender  Msk:  Back normal, normal gait. Normal strength and tone for age. Extremities: No clubbing, cyanosis or edema.   Neuro: Alert and oriented X 3. Psych:  Good affect, responds appropriately  Labs:   Lab Results  Component Value Date   WBC 6.1 11/27/2022   HGB 11.9 (L) 11/27/2022   HCT 36.6 11/27/2022   MCV 93.1 11/27/2022   PLT 111 (L) 11/27/2022   No results for input(s): "NA", "K", "CL", "CO2", "BUN", "CREATININE", "CALCIUM", "PROT", "BILITOT", "ALKPHOS", "ALT", "AST", "GLUCOSE" in the last 168 hours.  Invalid input(s): "LABALBU" No results found for: "CKTOTAL", "CKMB", "CKMBINDEX", "TROPONINI"   Lab Results  Component Value Date   CHOL 108 10/20/2022   CHOL 115 04/20/2022   CHOL 133 10/15/2021   Lab Results  Component Value Date   HDL 51 10/20/2022   HDL 49 04/20/2022   HDL 48 10/15/2021   Lab Results  Component Value Date   LDLCALC 41 10/20/2022   LDLCALC 51 04/20/2022   LDLCALC 66 10/15/2021   Lab Results  Component Value Date   TRIG 77 10/20/2022   TRIG 71 04/20/2022   TRIG 101 10/15/2021   No results found for: "CHOLHDL" No results found for: "LDLDIRECT"    Radiology:  CARDIAC CATHETERIZATION Result Date: 03/12/2023   Ost RCA to Prox RCA lesion is 95% stenosed.   Mid RCA lesion is 100% stenosed.   Dist RCA lesion is 95% stenosed.   Ost LM to Dist LM lesion is 60% stenosed.   Ost Cx to Prox Cx lesion is 90% stenosed.   Origin to Prox Graft lesion is 80% stenosed.   Dist LM to Prox LAD lesion is 100% stenosed.   A drug-eluting stent was successfully placed using a STENT ONYX FRONTIER 3.0X12.   Balloon angioplasty was performed using a BALLN MINITREK RX 2.0X15.   Post intervention, there is a 50% residual stenosis.   Post intervention, there is a 0% residual stenosis. 1.  Inferior STEMI 2.  Chronically occluded proximal LAD, diffuse 60% stenosis left main, diffuse 90% stenosis proximal left circumflex, occluded mid RCA 3.  Patent LIMA to distal LAD, patent SVG to D1, patent SVG to RCA with 80% stenosis proximal segment of graft, and diffuse 95% stenosis at the anastomotic site to distal RCA 4.  Multiple balloon inflations distal segment of SVG to distal RCA, unable to perform score flex balloon dilatation or shockwave lithotripsy, successful DES proximal segment of SVG to RCA Recommendations 1.  Dual antiplatelet therapy uninterrupted x 1 year 2.  Start metoprolol succinate 25 mg daily 3.  Start atorvastatin 80 mg daily 4.  2D echocardiogram    EKG: Sinus rhythm with ST elevations leads II, III and aVF  ASSESSMENT AND PLAN:   1.  Inferior STEMI, status post primary PCI  with DES proximal SVG to distal RCA, and multiple balloon inflations distal segment of SVG to distal RCA with resolution of chest pain 2.  Hyperlipidemia  Recommendations  1.  Dual antiplatelet therapy uninterrupted x 1 year 2.  Start metoprolol succinate 25 mg daily 3.  Start atorvastatin 80 mg daily 4.  2D echocardiogram  Signed: Marcina Millard MD,PhD, [facility] 03/12/2023, 8:07 PM

## 2023-03-12 NOTE — Assessment & Plan Note (Addendum)
 Bruise chest wall, traumatic Patient reports that a drawer in the ambulance fell out and hit her chin and chest she now has a hematoma on the chin and an abrasion on the chest. Ice packs Pain control Maxillofacial CT ordered--->"No acute bony abnormality noted.Soft tissue swelling along right mandible consistent with the given clinical history. No frank hematoma is noted"

## 2023-03-12 NOTE — ED Notes (Addendum)
 IV are causing pain, no IV placed by ER and no blood obtained at this time.

## 2023-03-12 NOTE — ED Notes (Signed)
 Pt transferred to cath lab.

## 2023-03-12 NOTE — ED Triage Notes (Addendum)
 Pt to ed frmo home via [facility] fro STEMI. Pt had a syncopal episode and defecated on herself.   HR in 40s SPO2 60% on RA  100% on NRB 140/80 80 H R 324 mg ASA 2 NTG SL  IV bilateral 18 G

## 2023-03-12 NOTE — Assessment & Plan Note (Signed)
 History of CABG Management per cardiology Orders per cardiology to include daily aspirin 81 mg, atorvastatin 80 mg, Toprol 25 mg Nitroglycerin sublingual as needed chest pain with morphine for breakthrough Cardiology consult to follow Close monitoring in stepdown

## 2023-03-12 NOTE — H&P (Addendum)
 History and Physical    Patient: Kirsten Riley:[id number] DOB: 1931/01/28 DOA: 03/12/2023 DOS: the patient was seen and examined on 03/12/2023 PCP: Marjie Skiff, NP  Patient coming from: Home  Chief Complaint:  Chief Complaint  Patient presents with   Code STEMI   HPI: Kirsten Riley is a 89 y.o. female with medical history significant of DM, hypothyroidism, hypertension CABG 38 years ago at age 30, without regular follow-up who is being admitted s/p emergent cardiac cath after presenting with an anterior STEMI.  She was found to have severe triple-vessel disease, underwent balloon angioplasty to the RCA graft with stent placement approximately-details in cardiology note.  She is now chest pain-free postprocedure.  She does complain of a painful swelling on her right lower jaw.  States that her drawer in the EMS vehicle fell out and hit her on the chin and chest during transportation to the hospital. Vitals post cath within normal limits Labs from the ED still pending.   Past Medical History:  Diagnosis Date   CAD (coronary artery disease)    Diabetes mellitus (HCC) 12/10/2014   Fractured rib    Hyperlipidemia    Hypertension    Hypothyroidism    Polymyalgia rheumatica (HCC) 06/08/2014   Past Surgical History:  Procedure Laterality Date   ABDOMINAL HYSTERECTOMY  1978   partial   CORONARY ARTERY BYPASS GRAFT     hypercholesterol     hypothyroid     Social History:  reports that she has never smoked. She has never used smokeless tobacco. She reports that she does not drink alcohol and does not use drugs.  Allergies  Allergen Reactions   Sulfa Antibiotics Other (See Comments)   Penicillins Rash    Family History  Problem Relation Age of Onset   Heart disease Mother    Stroke Mother    Hypertension Mother    Cancer Father    Cancer Sister        breast, ovarian, pancreatic   Heart disease Sister        x2   Cancer Brother    Heart disease Brother    Cancer Brother     Heart disease Brother    Cancer Brother    Heart disease Brother     Prior to Admission medications   Medication Sig Start Date End Date Taking? Authorizing Provider  cholecalciferol (VITAMIN D3) 25 MCG (1000 UNIT) tablet Take 2,000 Units by mouth daily. Take once a day    [provider]  doxycycline (VIBRA-TABS) 100 MG tablet Take 1 tablet (100 mg total) by mouth 2 (two) times daily. Take with food 03/05/23   Olevia Perches P, DO  levothyroxine (SYNTHROID) 75 MCG tablet Take 1 tablet (75 mcg total) by mouth daily before breakfast. 06/18/22   Cannady, Corrie Dandy T, NP  lovastatin (MEVACOR) 40 MG tablet Take 1 tablet (40 mg total) by mouth at bedtime. 10/20/22   Cannady, Corrie Dandy T, NP  Omega-3 Fatty Acids (FISH OIL) 300 MG CAPS Take by mouth.    [provider]    Physical Exam: Vitals:   03/12/23 1919 03/12/23 1924 03/12/23 1929 03/12/23 1934  BP: (!) 158/82 (!) 162/77 (!) 147/73 (!) 147/73  Pulse: 91 88 88 (!) 0  Resp: 16 20 18    Temp:      SpO2: 100% 100%    Weight:      Height:       Physical Exam Vitals and nursing note reviewed.  Constitutional:  General: She is not in acute distress. HENT:     Head: Normocephalic.     Comments: See pic.  Hematoma right lower jaw Cardiovascular:     Rate and Rhythm: Normal rate and regular rhythm.     Heart sounds: Normal heart sounds.  Pulmonary:     Effort: Pulmonary effort is normal.     Breath sounds: Normal breath sounds.  Chest:     Comments: Bruise right upper anterior chest, see pic Abdominal:     Palpations: Abdomen is soft.     Tenderness: There is no abdominal tenderness.  Neurological:     Mental Status: Mental status is at baseline.        Data Reviewed: Relevant notes from primary care and specialist visits, past discharge summaries as available in EHR, including Care Everywhere. Prior diagnostic testing as pertinent to current admission diagnoses Updated medications and problem lists for  reconciliation ED course, including vitals, labs, imaging, treatment and response to treatment Triage notes, nursing and pharmacy notes and ED provider's notes Notable results as noted in HPI   Assessment and Plan: * STEMI involving oth coronary artery of inferior wall (HCC) History of CABG Management per cardiology Orders per cardiology to include daily aspirin 81 mg, atorvastatin 80 mg, Toprol 25 mg Nitroglycerin sublingual as needed chest pain with morphine for breakthrough Cardiology consult to follow Close monitoring in stepdown  Jaw hematoma, initial encounter Bruise chest wall, traumatic Patient reports that a drawer in the ambulance fell out and hit her chin and chest she now has a hematoma on the chin and an abrasion on the chest. Ice packs Pain control Maxillofacial CT ordered--->"No acute bony abnormality noted.Soft tissue swelling along right mandible consistent with the given clinical history. No frank hematoma is noted"  Type 2 diabetes, diet controlled (HCC) Sliding scale insulin coverage  Hypertension Continue metoprolol  Hypothyroidism Continue levothyroxine      Advance Care Planning:   Code Status: Limited: Do not attempt resuscitation (DNR) -DNR-LIMITED -Do Not Intubate/DNI    Consults: cardiology [facility]  Family Communication: daughter Chestine Spore  Severity of Illness: The appropriate patient status for this patient is INPATIENT. Inpatient status is judged to be reasonable and necessary in order to provide the required intensity of service to ensure the patient's safety. The patient's presenting symptoms, physical exam findings, and initial radiographic and laboratory data in the context of their chronic comorbidities is felt to place them at high risk for further clinical deterioration. Furthermore, it is not anticipated that the patient will be medically stable for discharge from the hospital within 2 midnights of admission.   * I certify that at the  point of admission it is my clinical judgment that the patient will require inpatient hospital care spanning beyond 2 midnights from the point of admission due to high intensity of service, high risk for further deterioration and high frequency of surveillance required.*  Author: Andris Baumann, MD 03/12/2023 8:41 PM  For on call review [email or web address].

## 2023-03-12 NOTE — Progress Notes (Signed)
 BP 138/66   Pulse (!) 54   Temp 97.8 F (36.6 C) (Oral)   Ht 4\' 10"  (1.473 m)   Wt 101 lb (45.8 kg)   LMP  (LMP Unknown)   SpO2 98%   BMI 21.11 kg/m    Subjective:    Patient ID: Kirsten Riley, female    DOB: October 07, 1931, 89 y.o.   MRN: [id number]  HPI: Kirsten Riley is a 89 y.o. female  Chief Complaint  Patient presents with   Wound Check   SKIN INFECTION To left lower abdomen, cyst removed 03/05/23.  Currently on Doxycycline, about completed, has 5 more doses.  Reports less pain to area and has been out working in the yard. Duration: days Location: left lower abdomen History of trauma in area: no Pain: yes Quality: yes Severity: mild Redness: yes Swelling: no Oozing: some drainage to dressing Pus: no Fevers: no Nausea/vomiting: no Status: stable Treatments attempted:antibiotics and removal  Tetanus: UTD   Relevant past medical, surgical, family and social history reviewed and updated as indicated. Interim medical history since our last visit reviewed. Allergies and medications reviewed and updated.  Review of Systems  Constitutional:  Negative for activity change, appetite change, diaphoresis, fatigue and fever.  Respiratory:  Negative for cough, chest tightness and shortness of breath.   Cardiovascular:  Negative for chest pain, palpitations and leg swelling.  Gastrointestinal: Negative.   Endocrine: Negative.   Skin:  Positive for wound.  Neurological: Negative.   Psychiatric/Behavioral: Negative.    All other systems reviewed and are negative.   Per HPI unless specifically indicated above     Objective:    BP 138/66   Pulse (!) 54   Temp 97.8 F (36.6 C) (Oral)   Ht 4\' 10"  (1.473 m)   Wt 101 lb (45.8 kg)   LMP  (LMP Unknown)   SpO2 98%   BMI 21.11 kg/m   Wt Readings from Last 3 Encounters:  03/12/23 101 lb (45.8 kg)  03/10/23 101 lb (45.8 kg)  03/08/23 101 lb (45.8 kg)    Physical Exam Vitals and nursing note reviewed.  Constitutional:       General: She is awake. She is not in acute distress.    Appearance: She is well-developed and well-groomed. She is not ill-appearing or toxic-appearing.  HENT:     Head: Normocephalic.     Right Ear: Hearing and external ear normal.     Left Ear: Hearing and external ear normal.  Eyes:     General: Lids are normal.        Right eye: No discharge.        Left eye: No discharge.     Conjunctiva/sclera: Conjunctivae normal.     Pupils: Pupils are equal, round, and reactive to light.  Neck:     Thyroid: No thyromegaly.     Vascular: No carotid bruit.  Cardiovascular:     Rate and Rhythm: Regular rhythm. Bradycardia present.     Heart sounds: Normal heart sounds. No murmur heard.    No gallop.  Pulmonary:     Effort: Pulmonary effort is normal. No accessory muscle usage or respiratory distress.     Breath sounds: Normal breath sounds.  Abdominal:     General: Bowel sounds are normal. There is no distension.     Palpations: Abdomen is soft.     Tenderness: There is no abdominal tenderness.  Musculoskeletal:     Cervical back: Normal range of motion and neck  supple.     Right lower leg: No edema.     Left lower leg: No edema.  Lymphadenopathy:     Cervical: No cervical adenopathy.  Skin:    General: Skin is warm and dry.     Findings: Wound present.       Neurological:     Mental Status: She is alert and oriented to person, place, and time.     Deep Tendon Reflexes: Reflexes are normal and symmetric.     Reflex Scores:      Brachioradialis reflexes are 2+ on the right side and 2+ on the left side.      Patellar reflexes are 2+ on the right side and 2+ on the left side. Psychiatric:        Attention and Perception: Attention normal.        Mood and Affect: Mood normal.        Speech: Speech normal.        Behavior: Behavior normal. Behavior is cooperative.        Thought Content: Thought content normal.     Results for orders placed or performed in visit on 03/05/23   Surgical pathology   Collection Time: 03/05/23  4:02 PM  Result Value Ref Range   SURGICAL PATHOLOGY      SURGICAL PATHOLOGY CASE: [id number] PATIENT: [name] Surgical Pathology Report     Clinical History: suspect sebaceous material, but was open and oozing when she came in, 3 inch oozing wound on right side of abdomen (cm)     FINAL MICROSCOPIC DIAGNOSIS:  A. RIGHT SIDE ABDOMEN, SOFT TISSUE, EXCISION: Laminated keratinous debris consistent with epithelial inclusion cyst contents Negative for cyst wall   GROSS DESCRIPTION:  Received fresh is a 1.1 x 0.6 x 0.1 cm fragment of white-tan soft tissue, possibly consistent with a cyst wall.  The fragment is bisected revealing an underlying white-tan cut surface.  Also received in the container is an ample amount of white-tan, soft material, possibly consistent with cystic contents.  The specimen is entirely submitted in 1 block. (KW, 03/08/2023)  Final Diagnosis performed by Jerene Bears, MD.   Electronically signed 03/09/2023 Technical and / or Professional components performed at [facility]. [facility], [address] [place], [place] [address].  Immunohistochemistry Technical component (if applicable) was performed at [facility] [address], [place], [place] [address].   IMMUNOHISTOCHEMISTRY DISCLAIMER (if applicable): Some of these immunohistochemical stains may have been developed and the performance characteristics determine by [facility]. Some may not have been cleared or approved by the U.S. Food and Drug Administration. The FDA has determined that such clearance or approval is not necessary. This test is used for clinical purposes. It should not be regarded as investigational or for research. This laboratory is certified under the Clinical Laboratory Improvement Amendments of 1988 (CLIA-88) as qualified to perform high complexity clinical  laboratory testing.  The controls stained appropriately.   IHC stains are performed on formalin fixed, paraffin embedded tissue using a 3,3"diaminobenzidine (DAB) chromogen  and Leica Bond Autostainer System. The staining intensity of the nucleus is score manually and is reported as the percentage of tumor cell nuclei demonstrating specific nuclear staining. The specimens are fixed in 10% Neutral Formalin for at least 6 hours and up to 72hrs. These tests are validated on decalcified tissue. Results should be interpreted with caution given the possibility of false negative results on decalcified specimens. Antibody Clones are as  follows ER-clone 86F, PR-clone 16, Ki67- clone MM1. Some of these immunohistochemical stains may have been developed and the performance characteristics determined by [facility].       Assessment & Plan:   Problem List Items Addressed This Visit       Musculoskeletal and Integument   Sebaceous cyst - Primary   Abdomen, left lower.  Repacked area today, less packing needed.  Overall improving on exam today, less depth.  No tenderness with dressing change.  Overall tolerated well.  Almost completed Doxycycline.  Return on Monday.       Follow up plan: Return in about 3 days (around 03/15/2023) for Wound check.

## 2023-03-12 NOTE — Assessment & Plan Note (Signed)
 Sliding scale insulin coverage

## 2023-03-12 NOTE — Assessment & Plan Note (Signed)
 Continue levothyroxine

## 2023-03-12 NOTE — Patient Instructions (Signed)

## 2023-03-13 ENCOUNTER — Inpatient Hospital Stay
Admit: 2023-03-13 | Discharge: 2023-03-13 | Disposition: A | Discharge status: Home / Self Care | Attending: Cardiology | Admitting: Cardiology

## 2023-03-13 DIAGNOSIS — I2119 ST elevation (STEMI) myocardial infarction involving other coronary artery of inferior wall: Secondary | ICD-10-CM | POA: Diagnosis not present

## 2023-03-13 LAB — ECHOCARDIOGRAM COMPLETE
AR max vel: 1.6 cm2
AV Peak grad: 7.6 mmHg
Ao pk vel: 1.38 m/s
Area-P 1/2: 2.75 cm2
Calc EF: 59.6 %
Height: 58 in
P 1/2 time: 807 ms
S' Lateral: 2.6 cm
Single Plane A2C EF: 59.2 %
Single Plane A4C EF: 57.3 %
Weight: 1632 [oz_av]

## 2023-03-13 LAB — CBC
HCT: 40.4 % (ref 36.0–46.0)
Hemoglobin: 13.4 g/dL (ref 12.0–15.0)
MCH: 30.5 pg (ref 26.0–34.0)
MCHC: 33.2 g/dL (ref 30.0–36.0)
MCV: 92 fL (ref 80.0–100.0)
Platelets: 164 10*3/uL (ref 150–400)
RBC: 4.39 MIL/uL (ref 3.87–5.11)
RDW: 13.2 % (ref 11.5–15.5)
WBC: 9.5 10*3/uL (ref 4.0–10.5)
nRBC: 0 % (ref 0.0–0.2)

## 2023-03-13 LAB — GLUCOSE, CAPILLARY
Glucose-Capillary: 128 mg/dL — ABNORMAL HIGH (ref 70–99)
Glucose-Capillary: 159 mg/dL — ABNORMAL HIGH (ref 70–99)
Glucose-Capillary: 126 mg/dL — ABNORMAL HIGH (ref 70–99)
Glucose-Capillary: 134 mg/dL — ABNORMAL HIGH (ref 70–99)

## 2023-03-13 LAB — BASIC METABOLIC PANEL
Anion gap: 10 (ref 5–15)
BUN: 18 mg/dL (ref 8–23)
CO2: 22 mmol/L (ref 22–32)
Calcium: 9.4 mg/dL (ref 8.9–10.3)
Chloride: 107 mmol/L (ref 98–111)
Creatinine, Ser: 0.79 mg/dL (ref 0.44–1.00)
GFR, Estimated: >60 mL/min (ref >60)
Glucose, Bld: 135 mg/dL — ABNORMAL HIGH (ref 70–99)
Potassium: 4.1 mmol/L (ref 3.5–5.1)
Sodium: 139 mmol/L (ref 135–145)

## 2023-03-13 LAB — TROPONIN I (HIGH SENSITIVITY)
Troponin I (High Sensitivity): 3711 ng/L (ref <18)
Troponin I (High Sensitivity): 3642 ng/L (ref <18)

## 2023-03-13 LAB — CG4 I-STAT (LACTIC ACID): Lactic Acid, Venous: 1.1 mmol/L (ref 0.5–1.9)

## 2023-03-13 MED ORDER — ORAL CARE MOUTH RINSE: 15.0000 mL | OROMUCOSAL | Status: DC | PRN

## 2023-03-13 NOTE — Plan of Care (Signed)
  Problem: Education: Goal: Knowledge of General Education information will improve Description: Including pain rating scale, medication(s)/side effects and non-pharmacologic comfort measures Outcome: Progressing   Problem: Health Behavior/Discharge Planning: Goal: Ability to manage health-related needs will improve Outcome: Progressing   Problem: Clinical Measurements: Goal: Ability to maintain clinical measurements within normal limits will improve Outcome: Progressing Goal: Will remain free from infection Outcome: Progressing Goal: Diagnostic test results will improve Outcome: Progressing Goal: Cardiovascular complication will be avoided Outcome: Progressing   Problem: Activity: Goal: Risk for activity intolerance will decrease Outcome: Progressing   Problem: Nutrition: Goal: Adequate nutrition will be maintained Outcome: Progressing   Problem: Elimination: Goal: Will not experience complications related to bowel motility Outcome: Progressing   Problem: Safety: Goal: Ability to remain free from injury will improve Outcome: Progressing   Problem: Skin Integrity: Goal: Risk for impaired skin integrity will decrease Outcome: Progressing   Problem: Education: Goal: Understanding of CV disease, CV risk reduction, and recovery process will improve Outcome: Progressing Goal: Individualized Educational Video(s) Outcome: Progressing   Problem: Activity: Goal: Ability to return to baseline activity level will improve Outcome: Progressing   Problem: Cardiovascular: Goal: Ability to achieve and maintain adequate cardiovascular perfusion will improve Outcome: Progressing   Problem: Health Behavior/Discharge Planning: Goal: Ability to safely manage health-related needs after discharge will improve Outcome: Progressing   Problem: Education: Goal: Ability to describe self-care measures that may prevent or decrease complications (Diabetes Survival Skills Education) will  improve Outcome: Progressing Goal: Individualized Educational Video(s) Outcome: Progressing   Problem: Coping: Goal: Ability to adjust to condition or change in health will improve Outcome: Progressing   Problem: Fluid Volume: Goal: Ability to maintain a balanced intake and output will improve Outcome: Progressing   Problem: Health Behavior/Discharge Planning: Goal: Ability to identify and utilize available resources and services will improve Outcome: Progressing Goal: Ability to manage health-related needs will improve Outcome: Progressing   Problem: Metabolic: Goal: Ability to maintain appropriate glucose levels will improve Outcome: Progressing   Problem: Nutritional: Goal: Maintenance of adequate nutrition will improve Outcome: Progressing Goal: Progress toward achieving an optimal weight will improve Outcome: Progressing   Problem: Skin Integrity: Goal: Risk for impaired skin integrity will decrease Outcome: Progressing   Problem: Tissue Perfusion: Goal: Adequacy of tissue perfusion will improve Outcome: Progressing   Problem: Education: Goal: Understanding of cardiac disease, CV risk reduction, and recovery process will improve Outcome: Progressing Goal: Individualized Educational Video(s) Outcome: Progressing   Problem: Activity: Goal: Ability to tolerate increased activity will improve Outcome: Progressing   Problem: Cardiac: Goal: Ability to achieve and maintain adequate cardiovascular perfusion will improve Outcome: Progressing   Problem: Health Behavior/Discharge Planning: Goal: Ability to safely manage health-related needs after discharge will improve Outcome: Progressing

## 2023-03-13 NOTE — IPAL (Signed)
  Interdisciplinary Goals of Care Family Meeting   Date carried out: 03/13/2023  Location of the meeting: Bedside  Member's involved: Physician and Family Member or next of kin  Durable Power of Attorney or Environmental health practitioner: patient at bedside and daughter over phone    Discussion: We discussed goals of care for Kirsten Riley .   I have reviewed medical records including EPIC notes, labs and imaging, assessed the patient and then met  to discuss major active diagnoses, plan of care, natural trajectory, prognosis, GOC, EOL wishes, disposition and options including Full code/DNI/DNR and the concept of comfort care if DNR is elected. Questions and concerns were addressed. They are  in agreement to continue current plan of care . Election for DNR/DNI status.   Code status:   Code Status: Limited: Do not attempt resuscitation (DNR) -DNR-LIMITED -Do Not Intubate/DNI    Disposition: Continue current acute care  Time spent for the meeting: 32    Andris Baumann, MD  03/13/2023, 1:29 AM

## 2023-03-13 NOTE — Progress Notes (Signed)
 Patient transport off unit to room 236 via wheelchair by staff. No distress, vitals stable, on Ra, dressing to left radial clean dry and intake

## 2023-03-13 NOTE — Progress Notes (Signed)
 PROGRESS NOTE  Kirsten Riley    DOB: 01/28/1931, 89 y.o.  HQI:[id number]    Code Status: Limited: Do not attempt resuscitation (DNR) -DNR-LIMITED -Do Not Intubate/DNI    DOA: 03/12/2023   LOS: 1   Brief hospital course  Kirsten Riley is a 89 y.o. female with medical history significant of DM, hypothyroidism, hypertension CABG 38 years ago at age 49, without regular follow-up who is being admitted s/p emergent cardiac cath after presenting with an anterior STEMI.  She was found to have severe triple-vessel disease, underwent balloon angioplasty to the RCA graft with stent placement approximately-details in cardiology note.  She is now chest pain-free postprocedure.  She does complain of a painful swelling on her right lower jaw.  States that her drawer in the EMS vehicle fell out and hit her on the chin and chest during transportation to the hospital. Vitals post cath within normal limits  03/13/23 -stable  Assessment & Plan  Principal Problem:   STEMI involving oth coronary artery of inferior wall (HCC) Active Problems:   Jaw hematoma, initial encounter   Type 2 diabetes, diet controlled (HCC)   Hypothyroidism   Hypertension  STEMI involving oth coronary artery of inferior wall (HCC) History of CABG Echo shows normal function with grade I dd Management per cardiology Orders per cardiology to include daily aspirin 81 mg, atorvastatin 80 mg, Toprol 25 mg Nitroglycerin sublingual as needed chest pain with morphine for breakthrough Cardiology consult to follow Close monitoring in stepdown   Jaw hematoma, initial encounter Bruise chest wall, traumatic Pain control Maxillofacial CT ordered--->"No acute bony abnormality noted.Soft tissue swelling along right mandible consistent with the given clinical history. No frank hematoma is noted" no abnormalities on chest CT   Type 2 diabetes, diet controlled (HCC) Sliding scale insulin coverage   Hypertension Continue metoprolol    Hypothyroidism Continue levothyroxine  Body mass index is 21.32 kg/m.  VTE ppx: enoxaparin (LOVENOX) injection 40 mg Start: 03/12/23 2200   Diet:     Diet   Diet heart healthy/carb modified Room service appropriate? Yes; Fluid consistency: Thin   Consultants: Cardiology   Subjective 03/13/23    Pt reports feeling well. Continues to have tenderness on injury sites of chest and jaw. Denies SOB   Objective   Vitals:   03/13/23 0500 03/13/23 0600 03/13/23 0700 03/13/23 0728  BP: (!) 116/55 (!) 106/55 (!) 105/57   Pulse: 63 75 78   Resp: 19 15 16    Temp:    97.7 F (36.5 C)  TempSrc:    Oral  SpO2: 96% 94% 94%   Weight:      Height:        Intake/Output Summary (Last 24 hours) at 03/13/2023 0811 Last data filed at 03/13/2023 1324 Gross per 24 hour  Intake 797.59 ml  Output 700 ml  Net 97.59 ml   Filed Weights   03/12/23 1654  Weight: 46.3 kg     Physical Exam:  General: awake, alert, NAD HEENT: ecchymosis and swelling of R jaw Respiratory: normal respiratory effort. Cardiovascular: quick capillary refill, normal S1/S2, RRR, no JVD, murmurs Gastrointestinal: soft, NT, ND Nervous: A&O x3. no gross focal neurologic deficits, normal speech Extremities: moves all equally, no edema, normal tone Skin: dry, intact, normal temperature, normal color. No rashes, lesions or ulcers on exposed skin Psychiatry: normal mood, congruent affect  Labs   I have personally reviewed the following labs and imaging studies CBC    Component Value Date/Time  WBC 9.5 03/13/2023 0409   RBC 4.39 03/13/2023 0409   HGB 13.4 03/13/2023 0409   HGB 14.7 10/15/2021 0918   HCT 40.4 03/13/2023 0409   HCT 46.2 10/15/2021 0918   PLT 164 03/13/2023 0409   PLT 184 10/15/2021 0918   MCV 92.0 03/13/2023 0409   MCV 92 10/15/2021 0918   MCH 30.5 03/13/2023 0409   MCHC 33.2 03/13/2023 0409   RDW 13.2 03/13/2023 0409   RDW 12.5 10/15/2021 0918   LYMPHSABS 1.0 11/26/2022 2013   LYMPHSABS 2.0  10/15/2021 0918   MONOABS 1.1 (H) 11/26/2022 2013   EOSABS 0.0 11/26/2022 2013   EOSABS 0.1 10/15/2021 0918   BASOSABS 0.0 11/26/2022 2013   BASOSABS 0.1 10/15/2021 0918      Latest Ref Rng & Units 03/13/2023    4:09 AM 11/27/2022    5:02 AM 11/26/2022    8:13 PM  BMP  Glucose 70 - 99 mg/dL 086  578  469   BUN 8 - 23 mg/dL 18  13  16    Creatinine 0.44 - 1.00 mg/dL 6.29  5.28  4.13   Sodium 135 - 145 mmol/L 139  137  133   Potassium 3.5 - 5.1 mmol/L 4.1  3.8  4.0   Chloride 98 - 111 mmol/L 107  107  103   CO2 22 - 32 mmol/L 22  22  24    Calcium 8.9 - 10.3 mg/dL 9.4  8.1  8.9     CT MAXILLOFACIAL WO CONTRAST Result Date: 03/12/2023 CLINICAL DATA:  History of facial trauma, initial encounter EXAM: CT MAXILLOFACIAL WITHOUT CONTRAST TECHNIQUE: Multidetector CT imaging of the maxillofacial structures was performed. Multiplanar CT image reconstructions were also generated. RADIATION DOSE REDUCTION: This exam was performed according to the departmental dose-optimization program which includes automated exposure control, adjustment of the mA and/or kV according to patient size and/or use of iterative reconstruction technique. COMPARISON:  None Available. FINDINGS: Osseous: No acute fracture is identified. Degenerative changes of the temporomandibular joints are seen bilaterally Orbits: Orbits and their contents are within normal limits. Sinuses: Paranasal sinuses show no mucosal abnormality. No air-fluid levels are seen. Soft tissues: Surrounding soft tissue structures shows some soft tissue swelling along the course of the right mandible. By history there was blunt trauma in this area. No frank hematoma is seen. Limited intracranial: Atrophic changes are noted. IMPRESSION: No acute bony abnormality noted. Soft tissue swelling along right mandible consistent with the given clinical history. No frank hematoma is noted. Electronically Signed   By: Alcide Clever M.D.   On: 03/12/2023 23:18   CARDIAC  CATHETERIZATION Result Date: 03/12/2023   Ost RCA to Prox RCA lesion is 95% stenosed.   Mid RCA lesion is 100% stenosed.   Dist RCA lesion is 95% stenosed.   Ost LM to Dist LM lesion is 60% stenosed.   Ost Cx to Prox Cx lesion is 90% stenosed.   Origin to Prox Graft lesion is 80% stenosed.   Dist LM to Prox LAD lesion is 100% stenosed.   A drug-eluting stent was successfully placed using a STENT ONYX FRONTIER 3.0X12.   Balloon angioplasty was performed using a BALLN MINITREK RX 2.0X15.   Post intervention, there is a 50% residual stenosis.   Post intervention, there is a 0% residual stenosis. 1.  Inferior STEMI 2.  Chronically occluded proximal LAD, diffuse 60% stenosis left main, diffuse 90% stenosis proximal left circumflex, occluded mid RCA 3.  Patent LIMA to distal LAD, patent SVG  to D1, patent SVG to RCA with 80% stenosis proximal segment of graft, and diffuse 95% stenosis at the anastomotic site to distal RCA 4.  Multiple balloon inflations distal segment of SVG to distal RCA, unable to perform score flex balloon dilatation or shockwave lithotripsy, successful DES proximal segment of SVG to RCA Recommendations 1.  Dual antiplatelet therapy uninterrupted x 1 year 2.  Start metoprolol succinate 25 mg daily 3.  Start atorvastatin 80 mg daily 4.  2D echocardiogram    Disposition Plan & Communication  Patient status: Inpatient  Admitted From: Home Planned disposition location: Home Anticipated discharge date: 3/2 pending cardiac clearance   Family Communication: called son    Author: Leeroy Bock, DO [facility] 03/13/2023, 8:11 AM   Available by Epic secure chat 7AM-7PM. If 7PM-7AM, please contact night-coverage.  [facility] contact information found on [email or web address].

## 2023-03-13 NOTE — Progress Notes (Signed)
 Attempted to call report, nurse unavailable at this time. She will call back once she is able.

## 2023-03-13 NOTE — Progress Notes (Signed)
  Echocardiogram 2D Echocardiogram has been performed.  Kirsten Riley 03/13/2023, 8:20 AM

## 2023-03-13 NOTE — Progress Notes (Addendum)
 Patiens son Francesca Oman requesting information regarding incident causing large bruise patients right jaw, as well as right chest with bruise and abrasion. Ems was called and given information regarding incident along with location of where patient was picked up and arrival to ER 03/12/23 at 1651. Gary's number was given to Ems for them to contact him [phone]. Safety Zone was done by me

## 2023-03-13 NOTE — Progress Notes (Signed)
 Made Dr. Darrold Junker and Dr. Dareen Piano aware bp 95/47 map 60, heart rate 62. Per Dr. Darrold Junker hold metoprolol until bp improves

## 2023-03-13 NOTE — Progress Notes (Signed)
 Stent card given to patients son Jillyn Hidden

## 2023-03-13 NOTE — Progress Notes (Signed)
 Report called to 2a RN, room dirty, will transport once room is clean

## 2023-03-13 NOTE — Plan of Care (Signed)
  Problem: Clinical Measurements: Goal: Diagnostic test results will improve Outcome: Progressing Goal: Respiratory complications will improve Outcome: Progressing Goal: Cardiovascular complication will be avoided Outcome: Progressing   Problem: Pain Managment: Goal: General experience of comfort will improve and/or be controlled Outcome: Progressing   Problem: Education: Goal: Understanding of CV disease, CV risk reduction, and recovery process will improve Outcome: Progressing Goal: Individualized Educational Video(s) Outcome: Progressing   Problem: Cardiovascular: Goal: Ability to achieve and maintain adequate cardiovascular perfusion will improve Outcome: Progressing Goal: Vascular access site(s) Level 0-1 will be maintained Outcome: Progressing Note:   Assess vascular access site(s) for complications and intervene as needed   Problem: Education: Goal: Understanding of cardiac disease, CV risk reduction, and recovery process will improve Outcome: Progressing

## 2023-03-13 NOTE — Progress Notes (Signed)
 [facility] Cardiology  SUBJECTIVE: Patient laying in bed, reports feeling better, denies chest pain or shortness of breath   Vitals:   03/13/23 0600 03/13/23 0700 03/13/23 0728 03/13/23 0800  BP: (!) 106/55 (!) 105/57  (!) 109/56  Pulse: 75 78  62  Resp: 15 16  (!) 9  Temp:   97.7 F (36.5 C)   TempSrc:   Oral   SpO2: 94% 94%  97%  Weight:      Height:         Intake/Output Summary (Last 24 hours) at 03/13/2023 0825 Last data filed at 03/13/2023 1610 Gross per 24 hour  Intake 797.59 ml  Output 700 ml  Net 97.59 ml      PHYSICAL EXAM  General: Well developed, well nourished, in no acute distress HEENT:  Normocephalic and atramatic Neck:  No JVD.  Lungs: Clear bilaterally to auscultation and percussion. Heart: HRRR . Normal S1 and S2 without gallops or murmurs.  Abdomen: Bowel sounds are positive, abdomen soft and non-tender  Msk:  Back normal, normal gait. Normal strength and tone for age. Extremities: No clubbing, cyanosis or edema.   Neuro: Alert and oriented X 3. Psych:  Good affect, responds appropriately   LABS: Basic Metabolic Panel: Recent Labs    03/13/23 0409  NA 139  K 4.1  CL 107  CO2 22  GLUCOSE 135*  BUN 18  CREATININE 0.79  CALCIUM 9.4   Liver Function Tests: No results for input(s): "AST", "ALT", "ALKPHOS", "BILITOT", "PROT", "ALBUMIN" in the last 72 hours. No results for input(s): "LIPASE", "AMYLASE" in the last 72 hours. CBC: Recent Labs    03/13/23 0409  WBC 9.5  HGB 13.4  HCT 40.4  MCV 92.0  PLT 164   Cardiac Enzymes: No results for input(s): "CKTOTAL", "CKMB", "CKMBINDEX", "TROPONINI" in the last 72 hours. BNP: Invalid input(s): "POCBNP" D-Dimer: No results for input(s): "DDIMER" in the last 72 hours. Hemoglobin A1C: Recent Labs    03/12/23 2115  HGBA1C 6.9*   Fasting Lipid Panel: No results for input(s): "CHOL", "HDL", "LDLCALC", "TRIG", "CHOLHDL", "LDLDIRECT" in the last 72 hours. Thyroid Function Tests: No results for  input(s): "TSH", "T4TOTAL", "T3FREE", "THYROIDAB" in the last 72 hours.  Invalid input(s): "FREET3" Anemia Panel: No results for input(s): "VITAMINB12", "FOLATE", "FERRITIN", "TIBC", "IRON", "RETICCTPCT" in the last 72 hours.  CT MAXILLOFACIAL WO CONTRAST Result Date: 03/12/2023 CLINICAL DATA:  History of facial trauma, initial encounter EXAM: CT MAXILLOFACIAL WITHOUT CONTRAST TECHNIQUE: Multidetector CT imaging of the maxillofacial structures was performed. Multiplanar CT image reconstructions were also generated. RADIATION DOSE REDUCTION: This exam was performed according to the departmental dose-optimization program which includes automated exposure control, adjustment of the mA and/or kV according to patient size and/or use of iterative reconstruction technique. COMPARISON:  None Available. FINDINGS: Osseous: No acute fracture is identified. Degenerative changes of the temporomandibular joints are seen bilaterally Orbits: Orbits and their contents are within normal limits. Sinuses: Paranasal sinuses show no mucosal abnormality. No air-fluid levels are seen. Soft tissues: Surrounding soft tissue structures shows some soft tissue swelling along the course of the right mandible. By history there was blunt trauma in this area. No frank hematoma is seen. Limited intracranial: Atrophic changes are noted. IMPRESSION: No acute bony abnormality noted. Soft tissue swelling along right mandible consistent with the given clinical history. No frank hematoma is noted. Electronically Signed   By: Alcide Clever M.D.   On: 03/12/2023 23:18   CARDIAC CATHETERIZATION Result Date: 03/12/2023   Suezanne Jacquet  RCA to Prox RCA lesion is 95% stenosed.   Mid RCA lesion is 100% stenosed.   Dist RCA lesion is 95% stenosed.   Ost LM to Dist LM lesion is 60% stenosed.   Ost Cx to Prox Cx lesion is 90% stenosed.   Origin to Prox Graft lesion is 80% stenosed.   Dist LM to Prox LAD lesion is 100% stenosed.   A drug-eluting stent was successfully  placed using a STENT ONYX FRONTIER 3.0X12.   Balloon angioplasty was performed using a BALLN MINITREK RX 2.0X15.   Post intervention, there is a 50% residual stenosis.   Post intervention, there is a 0% residual stenosis. 1.  Inferior STEMI 2.  Chronically occluded proximal LAD, diffuse 60% stenosis left main, diffuse 90% stenosis proximal left circumflex, occluded mid RCA 3.  Patent LIMA to distal LAD, patent SVG to D1, patent SVG to RCA with 80% stenosis proximal segment of graft, and diffuse 95% stenosis at the anastomotic site to distal RCA 4.  Multiple balloon inflations distal segment of SVG to distal RCA, unable to perform score flex balloon dilatation or shockwave lithotripsy, successful DES proximal segment of SVG to RCA Recommendations 1.  Dual antiplatelet therapy uninterrupted x 1 year 2.  Start metoprolol succinate 25 mg daily 3.  Start atorvastatin 80 mg daily 4.  2D echocardiogram     Echo pending  TELEMETRY: Sinus rhythm:  ASSESSMENT AND PLAN:  Principal Problem:   STEMI involving oth coronary artery of inferior wall (HCC) Active Problems:   Type 2 diabetes, diet controlled (HCC)   Hypothyroidism   Hypertension   Jaw hematoma, initial encounter    1. Inferior STEMI, status post primary PCI with DES proximal SVG to distal RCA, and multiple balloon inflations distal segment of SVG to distal RCA with resolution of chest pain 2.  Hyperlipidemia   Recommendations   1.  Dual antiplatelet therapy uninterrupted x 1 year 2.  Continue to metoprolol succinate 25 mg daily 3.  Continue atorvastatin 80 mg daily 4.  Review 2D echocardiogram   Marcina Millard, MD, PhD, [facility] 03/13/2023 8:25 AM

## 2023-03-13 NOTE — Progress Notes (Addendum)
 Made Dr. Darrold Junker and Dr. Lyn Hollingshead aware troponin result is 3711. No new orders from Dr. Darrold Junker

## 2023-03-14 DIAGNOSIS — I2119 ST elevation (STEMI) myocardial infarction involving other coronary artery of inferior wall: Secondary | ICD-10-CM | POA: Diagnosis not present

## 2023-03-14 LAB — GLUCOSE, CAPILLARY
Glucose-Capillary: 153 mg/dL — ABNORMAL HIGH (ref 70–99)
Glucose-Capillary: 158 mg/dL — ABNORMAL HIGH (ref 70–99)

## 2023-03-14 MED ORDER — METOPROLOL SUCCINATE ER 25 MG PO TB24: 25.0000 mg | ORAL_TABLET | Freq: Every day | ORAL | 0 refills | Status: DC

## 2023-03-14 MED ORDER — ATORVASTATIN CALCIUM 80 MG PO TABS: 80.0000 mg | ORAL_TABLET | Freq: Every day | ORAL | 0 refills | Status: DC

## 2023-03-14 MED ORDER — ASPIRIN 81 MG PO TBEC: 81.0000 mg | DELAYED_RELEASE_TABLET | Freq: Every day | ORAL | 0 refills | Status: DC

## 2023-03-14 MED ORDER — NITROGLYCERIN 0.4 MG SL SUBL: 0.4000 mg | SUBLINGUAL_TABLET | SUBLINGUAL | 0 refills | Status: AC | PRN

## 2023-03-14 MED ORDER — CLOPIDOGREL BISULFATE 75 MG PO TABS: 75.0000 mg | ORAL_TABLET | Freq: Every day | ORAL | 0 refills | Status: DC

## 2023-03-14 NOTE — TOC Transition Note (Signed)
 Transition of Care [facility]) - Discharge Note   Patient Details  Name: Kirsten Riley MRN: [id number] Date of Birth: 03-Feb-1931  Transition of Care [facility]) CM/SW Contact:  Bing Quarry, RN Phone Number: 03/14/2023, 3:03 PM   Clinical Narrative: 3/2: Patient has discharge orders to home with home health via [place]. Choice was offered but no preference except insurance will pay for it. No DME recommendations. Son present to transport home.    Gabriel Cirri MSN RN CM  RN Case Manager [place]  Transitions of Care Direct Dial: [phone] (Weekends Only) [facility] Main Office Phone: [phone] [facility] Fax: [phone] [email or web address]      Final next level of care: Home w Home Health Services Barriers to Discharge: No Barriers Identified   Patient Goals and CMS Choice   CMS Medicare.gov Compare Post Acute Care list provided to:: Patient Represenative (must comment) (Son) Choice offered to / list presented to : Adult Children      Discharge Placement                       Discharge Plan and Services Additional resources added to the After Visit Summary for                  DME Arranged: N/A DME Agency:  (None recommended.)       HH Arranged: PT, OT HH Agency: [facility] Date [facility] Agency Contacted: 03/14/23 Time HH Agency Contacted: 1501 Representative spoke with at [facility] Agency: Kandee Keen  Social Drivers of Health (SDOH) Interventions SDOH Screenings   Food Insecurity: No Food Insecurity (03/12/2023)  Housing: Low Risk  (03/12/2023)  Transportation Needs: No Transportation Needs (03/12/2023)  Utilities: Not At Risk (03/12/2023)  Alcohol Screen: Low Risk  (07/07/2022)  Depression (PHQ2-9): Low Risk  (03/05/2023)  Financial Resource Strain: Low Risk  (07/07/2022)  Physical Activity: Insufficiently Active (07/07/2022)  Social Connections: Moderately Isolated (03/12/2023)  Stress: No Stress Concern Present (07/07/2022)  Tobacco Use: Low Risk  (03/12/2023)     Readmission  Risk Interventions     No data to display

## 2023-03-14 NOTE — Discharge Instructions (Addendum)
 Please continue to take your medications as listed in this paperwork to help keep your new heart stent open.  If you experience chest pain, take the nitroglycerin and call [phone] for further evaluation  Cardiology will follow up with you in 1-2 weeks

## 2023-03-14 NOTE — Progress Notes (Signed)
 [facility] Cardiology  SUBJECTIVE: Patient laying in bed, denies chest pain or shortness of breath   Vitals:   03/13/23 2111 03/14/23 0014 03/14/23 0525 03/14/23 0826  BP: (!) 125/56 115/61 (!) 104/51 127/60  Pulse: 76 83 60 78  Resp: 20 18 18 18   Temp: 98 F (36.7 C) 98.5 F (36.9 C) 98.3 F (36.8 C) (!) 97.5 F (36.4 C)  TempSrc: Oral   Oral  SpO2: 97% 98% 97% 96%  Weight:      Height:         Intake/Output Summary (Last 24 hours) at 03/14/2023 0935 Last data filed at 03/13/2023 1821 Gross per 24 hour  Intake 540 ml  Output 650 ml  Net -110 ml      PHYSICAL EXAM  General: Well developed, well nourished, in no acute distress HEENT:  Normocephalic and atramatic Neck:  No JVD.  Lungs: Clear bilaterally to auscultation and percussion. Heart: HRRR . Normal S1 and S2 without gallops or murmurs.  Abdomen: Bowel sounds are positive, abdomen soft and non-tender  Msk:  Back normal, normal gait. Normal strength and tone for age. Extremities: No clubbing, cyanosis or edema.   Neuro: Alert and oriented X 3. Psych:  Good affect, responds appropriately   LABS: Basic Metabolic Panel: Recent Labs    03/13/23 0409  NA 139  K 4.1  CL 107  CO2 22  GLUCOSE 135*  BUN 18  CREATININE 0.79  CALCIUM 9.4   Liver Function Tests: No results for input(s): "AST", "ALT", "ALKPHOS", "BILITOT", "PROT", "ALBUMIN" in the last 72 hours. No results for input(s): "LIPASE", "AMYLASE" in the last 72 hours. CBC: Recent Labs    03/13/23 0409  WBC 9.5  HGB 13.4  HCT 40.4  MCV 92.0  PLT 164   Cardiac Enzymes: No results for input(s): "CKTOTAL", "CKMB", "CKMBINDEX", "TROPONINI" in the last 72 hours. BNP: Invalid input(s): "POCBNP" D-Dimer: No results for input(s): "DDIMER" in the last 72 hours. Hemoglobin A1C: Recent Labs    03/12/23 2115  HGBA1C 6.9*   Fasting Lipid Panel: No results for input(s): "CHOL", "HDL", "LDLCALC", "TRIG", "CHOLHDL", "LDLDIRECT" in the last 72 hours. Thyroid  Function Tests: No results for input(s): "TSH", "T4TOTAL", "T3FREE", "THYROIDAB" in the last 72 hours.  Invalid input(s): "FREET3" Anemia Panel: No results for input(s): "VITAMINB12", "FOLATE", "FERRITIN", "TIBC", "IRON", "RETICCTPCT" in the last 72 hours.  ECHOCARDIOGRAM COMPLETE Result Date: 03/13/2023    ECHOCARDIOGRAM REPORT   Patient Name:   Kirsten Riley Date of Exam: 03/13/2023 Medical Rec #:  [id number]   Height:       58.0 in Accession #:    [id number]  Weight:       102.0 lb Date of Birth:  [birth year]    BSA:          1.368 m Patient Age:    89 years    BP:           106/55 mmHg Patient Gender: F           HR:           63 bpm. Exam Location:  [facility] Procedure: 2D Echo, Cardiac Doppler and Color Doppler (Both Spectral and Color            Flow Doppler were utilized during procedure). Indications:     Acute myocardial infarction, unspecified I21.9  History:         Patient has no prior history of Echocardiogram examinations.  Sonographer:     Overton Mam  RDCS, FASE Referring Phys:  [id number] Lyn Hollingshead Wilberth Damon Diagnosing Phys: Marcina Millard MD IMPRESSIONS  1. Left ventricular ejection fraction, by estimation, is 50 to 55%. The left ventricle has low normal function. The left ventricle has no regional wall motion abnormalities. Left ventricular diastolic parameters are consistent with Grade I diastolic dysfunction (impaired relaxation).  2. Right ventricular systolic function is normal. The right ventricular size is normal.  3. The mitral valve is normal in structure. Mild to moderate mitral valve regurgitation. No evidence of mitral stenosis.  4. The aortic valve is normal in structure. Aortic valve regurgitation is mild. No aortic stenosis is present.  5. The inferior vena cava is normal in size with greater than 50% respiratory variability, suggesting right atrial pressure of 3 mmHg. FINDINGS  Left Ventricle: Left ventricular ejection fraction, by estimation, is 50 to 55%. The left ventricle has  low normal function. The left ventricle has no regional wall motion abnormalities. Strain imaging was not performed. The left ventricular internal cavity size was normal in size. There is no left ventricular hypertrophy. Left ventricular diastolic parameters are consistent with Grade I diastolic dysfunction (impaired relaxation). Right Ventricle: The right ventricular size is normal. No increase in right ventricular wall thickness. Right ventricular systolic function is normal. Left Atrium: Left atrial size was normal in size. Right Atrium: Right atrial size was normal in size. Pericardium: There is no evidence of pericardial effusion. Mitral Valve: The mitral valve is normal in structure. Mild to moderate mitral valve regurgitation. No evidence of mitral valve stenosis. Tricuspid Valve: The tricuspid valve is normal in structure. Tricuspid valve regurgitation is mild . No evidence of tricuspid stenosis. Aortic Valve: The aortic valve is normal in structure. Aortic valve regurgitation is mild. Aortic regurgitation PHT measures 807 msec. No aortic stenosis is present. Aortic valve peak gradient measures 7.6 mmHg. Pulmonic Valve: The pulmonic valve was normal in structure. Pulmonic valve regurgitation is not visualized. No evidence of pulmonic stenosis. Aorta: The aortic root is normal in size and structure. Venous: The inferior vena cava is normal in size with greater than 50% respiratory variability, suggesting right atrial pressure of 3 mmHg. IAS/Shunts: No atrial level shunt detected by color flow Doppler. Additional Comments: 3D imaging was not performed.  LEFT VENTRICLE PLAX 2D LVIDd:         3.60 cm     Diastology LVIDs:         2.60 cm     LV e' medial:    5.87 cm/s LV PW:         1.10 cm     LV E/e' medial:  12.5 LV IVS:        0.80 cm     LV e' lateral:   5.44 cm/s LVOT diam:     1.80 cm     LV E/e' lateral: 13.5 LV SV:         39 LV SV Index:   28 LVOT Area:     2.54 cm  LV Volumes (MOD) LV vol d, MOD A2C:  22.5 ml LV vol d, MOD A4C: 30.9 ml LV vol s, MOD A2C: 9.2 ml LV vol s, MOD A4C: 13.2 ml LV SV MOD A2C:     13.3 ml LV SV MOD A4C:     30.9 ml LV SV MOD BP:      16.9 ml RIGHT VENTRICLE RV Basal diam:  2.30 cm RV S prime:     15.40 cm/s TAPSE (M-mode): 1.7 cm LEFT ATRIUM  Index        RIGHT ATRIUM          Index LA diam:        3.50 cm 2.56 cm/m   RA Area:     6.98 cm LA Vol (A2C):   15.7 ml 11.48 ml/m  RA Volume:   13.10 ml 9.58 ml/m LA Vol (A4C):   18.5 ml 13.52 ml/m LA Biplane Vol: 18.2 ml 13.31 ml/m  AORTIC VALVE                 PULMONIC VALVE AV Area (Vmax): 1.60 cm     PV Vmax:        0.98 m/s AV Vmax:        138.00 cm/s  PV Peak grad:   3.8 mmHg AV Peak Grad:   7.6 mmHg     RVOT Peak grad: 3 mmHg LVOT Vmax:      87.00 cm/s LVOT Vmean:     53.300 cm/s LVOT VTI:       0.153 m AI PHT:         807 msec  AORTA Ao Root diam: 3.30 cm Ao Asc diam:  2.70 cm MITRAL VALVE MV Area (PHT): 2.75 cm     SHUNTS MV Decel Time: 276 msec     Systemic VTI:  0.15 m MV E velocity: 73.40 cm/s   Systemic Diam: 1.80 cm MV A velocity: 104.00 cm/s MV E/A ratio:  0.71 Marcina Millard MD Electronically signed by Marcina Millard MD Signature Date/Time: 03/13/2023/3:13:07 PM    Final    CT MAXILLOFACIAL WO CONTRAST Result Date: 03/12/2023 CLINICAL DATA:  History of facial trauma, initial encounter EXAM: CT MAXILLOFACIAL WITHOUT CONTRAST TECHNIQUE: Multidetector CT imaging of the maxillofacial structures was performed. Multiplanar CT image reconstructions were also generated. RADIATION DOSE REDUCTION: This exam was performed according to the departmental dose-optimization program which includes automated exposure control, adjustment of the mA and/or kV according to patient size and/or use of iterative reconstruction technique. COMPARISON:  None Available. FINDINGS: Osseous: No acute fracture is identified. Degenerative changes of the temporomandibular joints are seen bilaterally Orbits: Orbits and their contents  are within normal limits. Sinuses: Paranasal sinuses show no mucosal abnormality. No air-fluid levels are seen. Soft tissues: Surrounding soft tissue structures shows some soft tissue swelling along the course of the right mandible. By history there was blunt trauma in this area. No frank hematoma is seen. Limited intracranial: Atrophic changes are noted. IMPRESSION: No acute bony abnormality noted. Soft tissue swelling along right mandible consistent with the given clinical history. No frank hematoma is noted. Electronically Signed   By: Alcide Clever M.D.   On: 03/12/2023 23:18   CARDIAC CATHETERIZATION Result Date: 03/12/2023   Ost RCA to Prox RCA lesion is 95% stenosed.   Mid RCA lesion is 100% stenosed.   Dist RCA lesion is 95% stenosed.   Ost LM to Dist LM lesion is 60% stenosed.   Ost Cx to Prox Cx lesion is 90% stenosed.   Origin to Prox Graft lesion is 80% stenosed.   Dist LM to Prox LAD lesion is 100% stenosed.   A drug-eluting stent was successfully placed using a STENT ONYX FRONTIER 3.0X12.   Balloon angioplasty was performed using a BALLN MINITREK RX 2.0X15.   Post intervention, there is a 50% residual stenosis.   Post intervention, there is a 0% residual stenosis. 1.  Inferior STEMI 2.  Chronically occluded proximal LAD, diffuse 60% stenosis left main, diffuse  90% stenosis proximal left circumflex, occluded mid RCA 3.  Patent LIMA to distal LAD, patent SVG to D1, patent SVG to RCA with 80% stenosis proximal segment of graft, and diffuse 95% stenosis at the anastomotic site to distal RCA 4.  Multiple balloon inflations distal segment of SVG to distal RCA, unable to perform score flex balloon dilatation or shockwave lithotripsy, successful DES proximal segment of SVG to RCA Recommendations 1.  Dual antiplatelet therapy uninterrupted x 1 year 2.  Start metoprolol succinate 25 mg daily 3.  Start atorvastatin 80 mg daily 4.  2D echocardiogram     Echo EF 50-55%, mild to moderate MR  TELEMETRY: Sinus  rhythm 62 bpm:  ASSESSMENT AND PLAN:  Principal Problem:   STEMI involving oth coronary artery of inferior wall (HCC) Active Problems:   Type 2 diabetes, diet controlled (HCC)   Hypothyroidism   Hypertension   Jaw hematoma, initial encounter    1. . Inferior STEMI, status post primary PCI with DES proximal SVG to distal RCA, and multiple balloon inflations distal segment of SVG to distal RCA with resolution of chest pain 2.  Hyperlipidemia   Recommendations   1.  Dual antiplatelet therapy uninterrupted x 1 year 2.  Continue to metoprolol succinate 25 mg daily 3.  Continue atorvastatin 80 mg daily 4.  May discharge home 5.  Follow-up with me 1 to 2 weeks   Marcina Millard, MD, PhD, [facility] 03/14/2023 9:35 AM
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
## 2023-03-14 NOTE — Plan of Care (Signed)
  Problem: Education: Goal: Knowledge of General Education information will improve Description: Including pain rating scale, medication(s)/side effects and non-pharmacologic comfort measures Outcome: Progressing   Problem: Health Behavior/Discharge Planning: Goal: Ability to manage health-related needs will improve Outcome: Progressing   Problem: Clinical Measurements: Goal: Ability to maintain clinical measurements within normal limits will improve Outcome: Progressing Goal: Will remain free from infection Outcome: Progressing Goal: Diagnostic test results will improve Outcome: Progressing Goal: Cardiovascular complication will be avoided Outcome: Progressing   Problem: Activity: Goal: Risk for activity intolerance will decrease Outcome: Progressing   Problem: Nutrition: Goal: Adequate nutrition will be maintained Outcome: Progressing   Problem: Elimination: Goal: Will not experience complications related to bowel motility Outcome: Progressing   Problem: Safety: Goal: Ability to remain free from injury will improve Outcome: Progressing   Problem: Skin Integrity: Goal: Risk for impaired skin integrity will decrease Outcome: Progressing   Problem: Education: Goal: Understanding of CV disease, CV risk reduction, and recovery process will improve Outcome: Progressing Goal: Individualized Educational Video(s) Outcome: Progressing   Problem: Activity: Goal: Ability to return to baseline activity level will improve Outcome: Progressing   Problem: Cardiovascular: Goal: Ability to achieve and maintain adequate cardiovascular perfusion will improve Outcome: Progressing   Problem: Health Behavior/Discharge Planning: Goal: Ability to safely manage health-related needs after discharge will improve Outcome: Progressing   Problem: Education: Goal: Ability to describe self-care measures that may prevent or decrease complications (Diabetes Survival Skills Education) will  improve Outcome: Progressing Goal: Individualized Educational Video(s) Outcome: Progressing   Problem: Coping: Goal: Ability to adjust to condition or change in health will improve Outcome: Progressing   Problem: Fluid Volume: Goal: Ability to maintain a balanced intake and output will improve Outcome: Progressing   Problem: Health Behavior/Discharge Planning: Goal: Ability to identify and utilize available resources and services will improve Outcome: Progressing Goal: Ability to manage health-related needs will improve Outcome: Progressing   Problem: Metabolic: Goal: Ability to maintain appropriate glucose levels will improve Outcome: Progressing   Problem: Nutritional: Goal: Maintenance of adequate nutrition will improve Outcome: Progressing Goal: Progress toward achieving an optimal weight will improve Outcome: Progressing   Problem: Skin Integrity: Goal: Risk for impaired skin integrity will decrease Outcome: Progressing   Problem: Tissue Perfusion: Goal: Adequacy of tissue perfusion will improve Outcome: Progressing   Problem: Education: Goal: Understanding of cardiac disease, CV risk reduction, and recovery process will improve Outcome: Progressing Goal: Individualized Educational Video(s) Outcome: Progressing   Problem: Activity: Goal: Ability to tolerate increased activity will improve Outcome: Progressing   Problem: Cardiac: Goal: Ability to achieve and maintain adequate cardiovascular perfusion will improve Outcome: Progressing   Problem: Health Behavior/Discharge Planning: Goal: Ability to safely manage health-related needs after discharge will improve Outcome: Progressing

## 2023-03-14 NOTE — Evaluation (Signed)
 Physical Therapy Evaluation Patient Details Name: Kirsten Riley MRN: [id number] DOB: 1931/06/08 Today's Date: 03/14/2023  History of Present Illness  Pt is an 89 y.o. female that presented to ED for chest pain and syncope. S/p Inferior STEMI, status post primary PCI with DES proximal SVG to distal RCA, and multiple balloon inflations distal segment of SVG to distal RCA with resolution of chest pain.  PMH of coronary artery disease, type 2 diabetes mellitus, hypertension and dyslipidemia as well as hypothyroidism and PMR.   Clinical Impression  Pt alert, oriented to self, place location but endorsed some confusion and feeling like she had trouble differentiating "real" vs not real. Pt was able to provide clear PLOF though, and verified with family. Supine <> sit supervision. Sit <> Stand with RW and ambulate with supervision as well. She ambulated ~276ft with RW no LOB, decreased gait velocity. Pt did endorse feeling light headed but spO2 and HR WFLs, pt stated she is often light headed at baseline. Returned to room with needs in reach. PT to follow acutely to ensure continued mobility, but cardiac rehab as discharge recommendation to maximize function.         If plan is discharge home, recommend the following: Assistance with cooking/housework;Assist for transportation;Help with stairs or ramp for entrance;Direct supervision/assist for medications management   Can travel by private vehicle        Equipment Recommendations None recommended by PT  Recommendations for Other Services       Functional Status Assessment Patient has had a recent decline in their functional status and demonstrates the ability to make significant improvements in function in a reasonable and predictable amount of time.     Precautions / Restrictions Precautions Precautions: Fall Restrictions Other Position/Activity Restrictions: "broken wing protocol" confirmed 48hrs with MD in hallway      Mobility  Bed  Mobility Overal bed mobility: Needs Assistance Bed Mobility: Supine to Sit, Sit to Supine     Supine to sit: Supervision Sit to supine: Supervision        Transfers Overall transfer level: Needs assistance Equipment used: Rolling walker (2 wheels) Transfers: Sit to/from Stand Sit to Stand: Supervision                Ambulation/Gait Ambulation/Gait assistance: Supervision Gait Distance (Feet): 210 Feet Assistive device: Rolling walker (2 wheels)         General Gait Details: decreased and pt reported some lightheadedness but said that was her baseline. HR and spO2 WFLs  Stairs            Wheelchair Mobility     Tilt Bed    Modified Rankin (Stroke Patients Only)       Balance Overall balance assessment: Needs assistance Sitting-balance support: Feet supported Sitting balance-Leahy Scale: Good     Standing balance support: Bilateral upper extremity supported Standing balance-Leahy Scale: Fair                               Pertinent Vitals/Pain Pain Assessment Pain Assessment: No/denies pain    Home Living Family/patient expects to be discharged to:: Private residence Living Arrangements: Alone Available Help at Discharge: Family;Available 24 hours/day Type of Home: House Home Access: Stairs to enter Entrance Stairs-Rails: Can reach both Entrance Stairs-Number of Steps: 3   Home Layout: One level Home Equipment: Agricultural consultant (2 wheels);Tub bench;Grab bars - tub/shower      Prior Function Prior Level of Function :  Independent/Modified Independent               ADLs Comments: enjoys working in her yard     Extremity/Trunk Assessment   Upper Extremity Assessment Upper Extremity Assessment: Overall WFL for tasks assessed (pt reminded of LUE NWB during session)    Lower Extremity Assessment Lower Extremity Assessment: Generalized weakness       Communication        Cognition Arousal: Alert Behavior During  Therapy: WFL for tasks assessed/performed   PT - Cognitive impairments: No apparent impairments                       PT - Cognition Comments: per pt she feels different then normal. concerned about understanding who and what part of the situation is real Following commands: Intact       Cueing       General Comments      Exercises     Assessment/Plan    PT Assessment  (recommend cardiac rehab at discharge)  PT Problem List         PT Treatment Interventions      PT Goals (Current goals can be found in the Care Plan section)  Acute Rehab PT Goals Patient Stated Goal: to go home PT Goal Formulation: With patient Time For Goal Achievement: 03/28/23 Potential to Achieve Goals: Good    Frequency       Co-evaluation               AM-PAC PT "6 Clicks" Mobility  Outcome Measure Help needed turning from your back to your side while in a flat bed without using bedrails?: None Help needed moving from lying on your back to sitting on the side of a flat bed without using bedrails?: None Help needed moving to and from a bed to a chair (including a wheelchair)?: None Help needed standing up from a chair using your arms (e.g., wheelchair or bedside chair)?: None Help needed to walk in hospital room?: None Help needed climbing 3-5 steps with a railing? : None 6 Click Score: 24    End of Session Equipment Utilized During Treatment: Gait belt Activity Tolerance: Patient tolerated treatment well Patient left: in bed;with call bell/phone within reach;with bed alarm set Nurse Communication: Mobility status PT Visit Diagnosis: Other abnormalities of gait and mobility (R26.89)    Time: 4259-5638 PT Time Calculation (min) (ACUTE ONLY): 28 min   Charges:   PT Evaluation $PT Eval Low Complexity: 1 Low PT Treatments $Therapeutic Activity: 8-22 mins PT General Charges $$ ACUTE PT VISIT: 1 Visit        Olga Coaster PT, DPT 1:14 PM,03/14/23

## 2023-03-15 ENCOUNTER — Encounter: Payer: Self-pay | Admitting: Cardiology

## 2023-03-15 ENCOUNTER — Ambulatory Visit: Payer: Medicare PPO | Admitting: Nurse Practitioner

## 2023-03-15 ENCOUNTER — Telehealth: Payer: Self-pay

## 2023-03-15 DIAGNOSIS — M87180 Osteonecrosis due to drugs, jaw: Secondary | ICD-10-CM

## 2023-03-15 DIAGNOSIS — M353 Polymyalgia rheumatica: Secondary | ICD-10-CM

## 2023-03-15 DIAGNOSIS — M0609 Rheumatoid arthritis without rheumatoid factor, multiple sites: Secondary | ICD-10-CM

## 2023-03-15 DIAGNOSIS — E119 Type 2 diabetes mellitus without complications: Secondary | ICD-10-CM

## 2023-03-15 DIAGNOSIS — M810 Age-related osteoporosis without current pathological fracture: Secondary | ICD-10-CM

## 2023-03-15 DIAGNOSIS — I2119 ST elevation (STEMI) myocardial infarction involving other coronary artery of inferior wall: Secondary | ICD-10-CM

## 2023-03-15 DIAGNOSIS — S0083XA Contusion of other part of head, initial encounter: Secondary | ICD-10-CM

## 2023-03-15 DIAGNOSIS — I5A Non-ischemic myocardial injury (non-traumatic): Secondary | ICD-10-CM

## 2023-03-15 LAB — LIPOPROTEIN A (LPA): Lipoprotein (a): <8.4 nmol/L (ref <75.0)

## 2023-03-15 NOTE — Patient Instructions (Addendum)
 Visit Information  Thank you for taking time to visit with me today. Please don't hesitate to contact me if I can be of assistance to you before your next scheduled telephone appointment.  Your next appointment is by telephone on 3/11 at 11:00a m with Redge Gainer, RN   Discussed [facility]  TOC program and weekly calls to patient to assess condition/status, medication management  and provide support/education as indicated . Patient/ Caregiver voiced understanding and is  agreeable to [facility] program  with 1 additional call. Patient educated on red flag s/s to watch for and was encouraged to report, any changes in baseline or medication regimen, changes in health status  /  well-being, safety concerns  or any new unmanaged side effects or symptoms not relieved with interventions  to PCP and / or the  VBCI Case Management team .    Medication review  Reviewed current home medications -- provided education as needed. Patient is aware of potential side effects and was encouraged to notify PCP for any adverse side effects or unwanted symptoms not relieved with interventions  Patient will call 911 for Medical Emergencies or Life -Threatening Symptoms.  Reviewed goals for care Patient/ Caregiver verbalizes understanding of instructions with the plan of care . The  Patient / Caregiver was encouraged to make informed decisions about care, actively participate in managing health conditions, and implement lifestyle changes as needed to promote independence and self-management of healthcare. SDOH screenings have been completed and addressed if indicted.  There are no reported barriers to care.    Follow-up Plan VBCI Case Management Nurse will provide follow-up and on-going assessment ,evaluation and education of disease processes, recommended interventions for both chronic and acute medical conditions ,  along with ongoing review of symptoms ,medication reviews / reconciliation during each weekly call . Any  updates , inconsistencies, discrepancies or acute care concerns will be addressed and routed to the correct Practitioner if indicated   [facility]  Please call the care guide team at [phone]  if you need to cancel or reschedule your appointment . For scheduled calls -Three attempts will be made to reach you -if the scheduled call is missed or  we are unable to reach the you after 3 attempts no additional outreach attempts will be made and the TOC follow-up will be closed .   If you need to speak to a Nurse you may  call me directly at the number below or if I am unavailable,and  your need is urgent  please call the main VBCI number at [phone] and ask to speak with one of the [facility] ( Transition of Care )  Nurses  .  Patient was encouraged to Contact PCP with any changes in baseline or  medication regimen,  changes in health status  /  well-being, safety concerns, including falls any questions or concerns regarding ongoing medical care, any difficulty obtaining or picking up prescriptions, any changes or worsening in condition- including  symptoms not relieved  with interventions                                                                            Additionally, If you experience worsening of your symptoms,  develop shortness of breath, If you are experiencing a medical emergency,  develop suicidal or homicidal thoughts you must seek medical attention immediately by calling 911 or report to your local emergency department or urgent care.   If you have a non-emergency medical problem during routine business hours, please contact your provider's office and ask to speak with a nurse.       Please take the time to read instructions/literature along with the possible adverse reactions/side effects for all the Medicines that have been prescribed to you. Only take newly prescribed  Medications after you have completely understood and accept all the possible adverse reactions/side  effects.   Do not take more than prescribed Medications for  Pain, Sleep and Anxiety. Do not drive when taking Pain medications or sleep aid/ insomnia  medications It is not advisable to combine anxiety, sleep and pain medications without talking with your primary care practitioner    If you are experiencing a Mental Health or Behavioral Health Crisis or need someone to talk to Please call the Suicide and Crisis Lifeline: 988 You may also call the [place] National Suicide Prevention Lifeline: (712) 817-3052 or TTY: 5345920448 TTY 408-586-0794) to talk to a trained counselor.  You may call the Behavioral Health Crisis Line at 253-376-0765, at any time, 24 hours a day, 7 days a week- however If you are in danger or need immediate medical attention, call 911.   If you would like help to quit smoking, call 1-800-QUIT-NOW ( 747-505-2335) OR Espaol: 1-855-Djelo-Ya (8-756-433-2951) o para ms informacin haga clic aqu or Text READY to 884-166 to register via text.   Susa Loffler , BSN, RN [place]   VBCI-Population Health RN Care Manager Direct Dial [phone]  Website: [email or web address]

## 2023-03-15 NOTE — Transitions of Care (Post Inpatient/ED Visit) (Addendum)
 03/15/2023  Name: Kirsten Riley MRN: [id number] DOB: 1931/02/16  Today's TOC FU Call Status: Today's TOC FU Call Status:: Successful TOC FU Call Completed TOC FU Call Complete Date: 03/15/23 Patient's Name and Date of Birth confirmed.  Transition Care Management Follow-up Telephone Call Date of Discharge: 03/14/23 Discharge Facility: [facility]) Type of Discharge: Inpatient Admission Primary Inpatient Discharge Diagnosis:: STEMI involving oth coronary artery of inferior wall How have you been since you were released from the hospital?: Better Any questions or concerns?: No  Items Reviewed: Did you receive and understand the discharge instructions provided?: Yes Medications obtained,verified, and reconciled?: Yes (Medications Reviewed) Any new allergies since your discharge?: No Dietary orders reviewed?: Yes Type of Diet Ordered:: Reg Heart healthy Do you have support at home?: Yes People in Home: child(ren), adult Name of Support/Comfort Primary Source: Daughter -Kirsten Riley  Medications Reviewed Today: Medications Reviewed Today     Reviewed by Kirsten Barrios, RN (Registered Nurse) on 03/15/23 at 1337  Med List Status: <None>   Medication Order Taking? Sig Documenting Provider Last Dose Status Informant  aspirin EC 81 MG tablet [id number] Yes Take 1 tablet (81 mg total) by mouth daily. Swallow whole. Leeroy Bock, MD Taking Active   atorvastatin (LIPITOR) 80 MG tablet [id number] Yes Take 1 tablet (80 mg total) by mouth daily. Leeroy Bock, MD Taking Active   cholecalciferol (VITAMIN D3) 25 MCG (1000 UNIT) tablet [id number] Yes Take 2,000 Units by mouth daily. Take once a day [provider] Taking Active Spouse/Significant Other, Pharmacy Records  clopidogrel (PLAVIX) 75 MG tablet [id number] Yes Take 1 tablet (75 mg total) by mouth daily with breakfast. Leeroy Bock, MD Taking Active   levothyroxine (SYNTHROID) 75 MCG  tablet [id number] Yes Take 1 tablet (75 mcg total) by mouth daily before breakfast. Marjie Skiff, NP Taking Active Spouse/Significant Other, Pharmacy Records  metoprolol succinate (TOPROL-XL) 25 MG 24 hr tablet [id number] Yes Take 1 tablet (25 mg total) by mouth daily. Leeroy Bock, MD Taking Active   nitroGLYCERIN (NITROSTAT) 0.4 MG SL tablet [id number] Yes Place 1 tablet (0.4 mg total) under the tongue every 5 (five) minutes x 3 doses as needed for up to 10 doses for chest pain. Leeroy Bock, MD Taking Active   Omega-3 Fatty Acids (FISH OIL) 300 MG CAPS [id number] No Take by mouth.  Patient not taking: Reported on 03/15/2023   [provider] Not Taking Active Spouse/Significant Other, Pharmacy Records          Medication reconciliation / review completed based on most recent discharge summary and EHR medication list. Confirmed patient is taking all newly prescribed medications as instructed (any discrepancies are noted in review section)   Patient  & Caregiver is aware of any changes to and / or  any dosage adjustments to medication regimen. Patient & Caregiver denied questions at this time and reports no barriers to medication adherence.    Home Care and Equipment/Supplies: Were Home Health Services Ordered?: Yes [facility] of [facility] Health Agency:: [facility] [phone] Has Agency set up a time to come to your home?: Yes First Home Health Visit Date: 03/17/23 Any new equipment or medical supplies ordered?: No  Functional Questionnaire: Do you need assistance with bathing/showering or dressing?: Yes (she is fearful of transfers in/out of tub) Do you need assistance with meal preparation?: No Do you need assistance with eating?: No Do you have difficulty maintaining continence: No Do you need assistance with  getting out of bed/getting out of a chair/moving?: Yes (needs SBA uses walker) Do you have difficulty managing or taking your medications?: Yes (family manages)  Follow  up appointments reviewed: PCP Follow-up appointment confirmed?: Yes Date of PCP follow-up appointment?: 03/18/23 Follow-up Provider: [facility] Follow-up appointment confirmed?: No Reason Specialist Follow-Up Not Confirmed: Patient has Specialist Provider Number and will Call for Appointment Do you need transportation to your follow-up appointment?: No (family transport) Do you understand care options if your condition(s) worsen?: Yes-patient verbalized understanding  SDOH Interventions Today    Flowsheet Row Most Recent Value  SDOH Interventions   Food Insecurity Interventions Intervention Not Indicated  Housing Interventions Intervention Not Indicated  Transportation Interventions Intervention Not Indicated, Patient Resources (Friends/Family)  Utilities Interventions Intervention Not Indicated      Interventions Today    Flowsheet Row Most Recent Value  Chronic Disease   Chronic disease during today's visit Other  [NSTEMI]  General Interventions   General Interventions Discussed/Reviewed General Interventions Discussed, General Interventions Reviewed, Doctor Visits  Doctor Visits Discussed/Reviewed Doctor Visits Discussed, Doctor Visits Reviewed, PCP, Specialist  PCP/Specialist Visits Compliance with follow-up visit  Exercise Interventions   Exercise Discussed/Reviewed Physical Activity  Physical Activity Discussed/Reviewed Physical Activity Reviewed  Nutrition Interventions   Nutrition Discussed/Reviewed Nutrition Reviewed, Nutrition Discussed  Pharmacy Interventions   Pharmacy Dicussed/Reviewed Pharmacy Topics Reviewed, Medications and their functions, Medication Adherence  Safety Interventions   Safety Discussed/Reviewed Safety Reviewed, Fall Risk      Discussed VBCI  TOC program and weekly calls to patient to assess condition/status, medication management  and provide support/education as indicated . Patient/ Caregiver voiced understanding and is   agreeable to [facility] program  with 1 additional call. She still has significant bruising to right jaw line, She can use warm compress. There is a small abrasion to right upper chest Both areas are OTA. She started on Plavix and is aware to report any unusual bleeding  Patient educated on red flag s/s to watch for and was encouraged to report, any changes in baseline or medication regimen, changes in health status  /  well-being, safety concerns  or any new unmanaged side effects or symptoms not relieved with interventions  to PCP and / or the  VBCI Case Management team .    She has strong support at home Will be [facility] by [facility] and based on initial evaluation they will consider adding Nurse to visit schedule   Susa Loffler , BSN, RN [facility]   VBCI-Population Health RN Care Manager Direct Dial [phone]  Fax: [phone] Website: [email or web address]

## 2023-03-17 ENCOUNTER — Telehealth: Payer: Self-pay

## 2023-03-17 NOTE — Progress Notes (Signed)
 Complex Care Management Note Care Guide Note  03/17/2023 Name: Kirsten Riley MRN: [id number] DOB: 1931-07-29  Kirsten Riley is a 89 y.o. year old female who is a primary care patient of Cannady, Dorie Rank, NP . The community resource team was consulted for assistance with  home health.  SDOH screenings and interventions completed:  Yes  Social Drivers of Health From This Encounter   Food Insecurity: No Food Insecurity (03/17/2023)   Hunger Vital Sign    Worried About Running Out of Food in the Last Year: Never true    Ran Out of Food in the Last Year: Never true  Housing: Low Risk  (03/17/2023)   Housing Stability Vital Sign    Unable to Pay for Housing in the Last Year: No    Number of Times Moved in the Last Year: 0    Homeless in the Last Year: No  Financial Resource Strain: Low Risk  (03/17/2023)   Overall Financial Resource Strain (CARDIA)    Difficulty of Paying Living Expenses: Not very hard  Transportation Needs: No Transportation Needs (03/17/2023)   PRAPARE - Administrator, Civil Service (Medical): No    Lack of Transportation (Non-Medical): No  Utilities: Not At Risk (03/17/2023)   Utilities    Threatened with loss of utilities: No    SDOH Interventions Today    Flowsheet Row Most Recent Value  SDOH Interventions   Food Insecurity Interventions Other (Comment)  [Emailed Meals on Wheels referral to Kirsten Riley at [employer] [facility] [place].]        Care guide performed the following interventions: Spoke with patient's son Kirsten Riley on [place]. He stated that the patient will be assigned a nurse with Kirsten Riley. He is not intested in an list of home health agencies but her requested a referral be sent to [facility]. Emailed a referral to Kirsten Riley at [employer] [facility] [employer].  Follow Up Plan:  No further follow up planned at this time. The patient has been provided with needed resources.  Encounter Outcome:  Patient Visit  Completed  [facility]  [facility] Guide Direct Dial: [phone]  Fax: [phone] Website: [email or web address]

## 2023-03-18 ENCOUNTER — Encounter: Payer: Self-pay | Admitting: Nurse Practitioner

## 2023-03-18 ENCOUNTER — Ambulatory Visit (INDEPENDENT_AMBULATORY_CARE_PROVIDER_SITE_OTHER): Admitting: Nurse Practitioner

## 2023-03-18 VITALS — BP 100/59 | HR 46 | Temp 97.6°F | Ht <= 58 in | Wt 100.0 lb

## 2023-03-18 DIAGNOSIS — E1169 Type 2 diabetes mellitus with other specified complication: Secondary | ICD-10-CM | POA: Diagnosis not present

## 2023-03-18 DIAGNOSIS — E1159 Type 2 diabetes mellitus with other circulatory complications: Secondary | ICD-10-CM | POA: Diagnosis not present

## 2023-03-18 DIAGNOSIS — S0083XA Contusion of other part of head, initial encounter: Secondary | ICD-10-CM

## 2023-03-18 DIAGNOSIS — I252 Old myocardial infarction: Secondary | ICD-10-CM

## 2023-03-18 DIAGNOSIS — R001 Bradycardia, unspecified: Secondary | ICD-10-CM

## 2023-03-18 DIAGNOSIS — E119 Type 2 diabetes mellitus without complications: Secondary | ICD-10-CM | POA: Diagnosis not present

## 2023-03-18 DIAGNOSIS — E785 Hyperlipidemia, unspecified: Secondary | ICD-10-CM

## 2023-03-18 DIAGNOSIS — D696 Thrombocytopenia, unspecified: Secondary | ICD-10-CM | POA: Diagnosis not present

## 2023-03-18 DIAGNOSIS — L723 Sebaceous cyst: Secondary | ICD-10-CM

## 2023-03-18 DIAGNOSIS — E063 Autoimmune thyroiditis: Secondary | ICD-10-CM

## 2023-03-18 DIAGNOSIS — I152 Hypertension secondary to endocrine disorders: Secondary | ICD-10-CM

## 2023-03-18 MED ORDER — DOXYCYCLINE HYCLATE 100 MG PO TABS
100.0000 mg | ORAL_TABLET | Freq: Two times a day (BID) | ORAL | 0 refills | Status: AC
Start: 2023-03-18 — End: 2023-03-23

## 2023-03-18 MED ORDER — ASPIRIN 81 MG PO TBEC: 81.0000 mg | DELAYED_RELEASE_TABLET | Freq: Every day | ORAL | 1 refills | Status: DC

## 2023-03-18 MED ORDER — ATORVASTATIN CALCIUM 80 MG PO TABS: 80.0000 mg | ORAL_TABLET | Freq: Every day | ORAL | 2 refills | Status: AC

## 2023-03-18 MED ORDER — METOPROLOL SUCCINATE ER 25 MG PO TB24: 12.5000 mg | ORAL_TABLET | Freq: Every day | ORAL | 1 refills | Status: DC

## 2023-03-18 MED ORDER — CLOPIDOGREL BISULFATE 75 MG PO TABS: 75.0000 mg | ORAL_TABLET | Freq: Every day | ORAL | 1 refills | Status: AC

## 2023-03-18 MED ORDER — ONETOUCH ULTRASOFT LANCETS MISC: 12 refills | Status: AC

## 2023-03-18 MED ORDER — GLUCOSE BLOOD VI STRP: ORAL_STRIP | 5 refills | Status: DC

## 2023-03-18 MED ORDER — ONETOUCH VERIO W/DEVICE KIT: PACK | 1 refills | Status: AC

## 2023-03-18 NOTE — Assessment & Plan Note (Signed)
 Improved at discharge, continue to monitor.  Repeat labs next visit, defer today due to difficult lab draw and multiple recent access performed.

## 2023-03-18 NOTE — Assessment & Plan Note (Signed)
 Chronic, ongoing.  Continue current medication regimen and adjust as needed.  TSH and Free T4 next visit.  She has been more on hyper side last 3 checks, but she does not like medication changes and refuses to change dosing.  Has tried changes in past and had similar side effects.  Wishes to maintain 75 MCG dosing, which we will at this time and she will alert provider if any issues -- such as heart palpitations or increased anxiety. We have had multiple discussions on need to change medication dosing.

## 2023-03-18 NOTE — Assessment & Plan Note (Addendum)
 Due to injury while being transported by EMS.  Slight swelling remains to jaw line, but bruising improving. Monitor closely.  Recommend application of ice to area at home and can take Tylenol as needed.

## 2023-03-18 NOTE — Assessment & Plan Note (Signed)
 At baseline had bradycardia, but currently lower and having symptoms with this.  Will reduce Metoprolol XL to 12.5 MG daily, educated family and patient.  Recommend compression hose on during day and off at night.  Ensure good fluid intake at home.  Concern for falls with current symptoms, discussed with patient.

## 2023-03-18 NOTE — Assessment & Plan Note (Signed)
Chronic, ongoing.  Continue current medication regimen and adjust as needed.  Can consider discontinuation of this if patient wishes to minimize medications in future.  Lipid panel today. 

## 2023-03-18 NOTE — Assessment & Plan Note (Addendum)
 Abdomen, left lower.  Repacked area today, less packing needed. No tenderness with dressing change.  There is mild odor today and drainage.  Will restart Doxycycline BID for 5 days.  Educated family on how to clean and change dressing at home.  They have packing materials and dressings.

## 2023-03-18 NOTE — Patient Instructions (Signed)

## 2023-03-18 NOTE — Assessment & Plan Note (Signed)
 Currently stable with no further CP or pressure.  Continue current medication regimen.  Plavix and ASA for 1 year recommended by cardiology (stop date 03/13/24).  Will continue collaboration with cardiology.

## 2023-03-18 NOTE — Assessment & Plan Note (Signed)
 Stable, well below goal, more hypotensive today.  Continue Metoprolol, but will reduce to 12.5 MG XL dosing daily due to bradycardia in 40's and her new dizziness with position changes.  Concern for fall risk.  Recommend she wear compression hose at home, on during day and off at night.  Educated patient and family on this.  Recommend she monitor BP at least a few mornings a week at home and document.  Obtain labs next visit.

## 2023-03-18 NOTE — Assessment & Plan Note (Signed)
 Chronic, stable, diet-controlled.  A1c 6.9% in hospital and previous 6.7% -- slight trend up, she prefers not to start medication.  Urine micro 22 April 2022, recheck next visit. Recommend continued focus on diet at this time, discussed with her at length benefit of adding on medication -- however she refuses to start at this time.  Due to advanced age more lenient goal recommended, A1C <8 per geriatric society.  Avoid hypoglycemia.  Initiate medication in future if needed, could consider and SGLT2 for heart health. Glucometer supplies sent in and family + patient will monitor at home. - Foot and eye exams up to date. - Statin on board, no ACE/ARB due to lower BP levels at baseline. - Vaccines up to date.

## 2023-03-18 NOTE — Progress Notes (Addendum)
 BP (!) 100/59   Pulse (!) 46   Temp 97.6 F (36.4 C) (Oral)   Ht 4\' 10"  (1.473 m)   Wt 100 lb (45.4 kg)   LMP  (LMP Unknown)   SpO2 96%   BMI 20.90 kg/m    Subjective:    Patient ID: Kirsten Riley, female    DOB: 07-02-1931, 89 y.o.   MRN: [id number]  HPI: Kirsten Riley is a 89 y.o. female  Chief Complaint  Patient presents with   Hospitalization Follow-up   Wound Check   Transition of Care Hospital Follow up.  Was admitted to [facility] on 03/12/23 after going home and feeling swelling/pressure in chest that started to get worse. EMS came and took her to [facility], during transport a drawer from the ambulance fell and hit her to side of right face.  She remains tender to side of face and neck, continues to have bruising.  Medication changes in hospital: added Metoprolol XL 25 MG daily, Plavix 75 MG daily, and Atorvastatin to 80 MG daily. Stent placement performed in hospital due to finding of inferior STEMI.  To continue dual platelet therapy for one year.  Did receive insulin in hospital due to some elevations in BS. A1c 6.9%. She is very adamant about not changing thyroid medication dose, even though recent levels have been more hyper.  We tried lowering dose and she reported side effects like she had in past.  No CP/or pressure or SOB since leaving the hospital.  Has stayed dizzy up until today.  If bends over gets dizzy. Has OT and PT + nurse coming to home.  Her children are staying with her 24 hours a day to assist with her needs.  In hospital they did continue wound care to area of cyst removal left lower abdomen.   "Brief Hospital Course Summary: [name] is a 89 y.o. 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 She presented to the ED with chest pain.    Found to be having acute STEMI. Cardiology consulted and treated with urgent cath. revealed severe three-vessel coronary artery disease, patent LIMA to LAD, patent SVG to D1,  and patent SVG to RCA with erupted 80% stenosis in the proximal segment of of the vein graft, and diffuse 95% stenosis at the anastomotic site. The patient underwent PCI receiving DES in the proximal segment of the vein graft, and multiple balloon inflations in the distal segment unsuccessful attempt to deliver stent to the distal segment.    She tolerated the procedure well. Medications modified as listed below and sent home with outpatient cardiology follow up.    PT evaluated and didn't recommend further follow up.    Of note, States that her drawer in the EMS vehicle fell out and hit her on the chin and chest during transportation to the hospital. Imaging in ED was negative for fracture of face or chest. She has significant bruising on R jawline. Swelling improved throughout admission and tenderness was minimal on day of dc."  Hospital/Facility: [facility] D/C Physician: Dr. Dareen Piano D/C Date: 03/14/23  Records Requested: 03/18/23 Records Received: 03/18/23 Records Reviewed: 03/18/23  Diagnoses on Discharge: STEMI involving oth coronary artery of inferior wall  Date of interactive Contact within 48 hours of discharge:  Contact was through: phone  Date of 7 day or 14 day face-to-face visit:    within 7 days  Outpatient Encounter Medications as of 03/18/2023  Medication Sig  Blood Glucose Monitoring Suppl (ONETOUCH VERIO) w/Device KIT To check blood sugar twice a day with goal fasting in morning <130 and goal 2 hours after meal <180.  Write all checks down for provider visits.   cholecalciferol (VITAMIN D3) 25 MCG (1000 UNIT) tablet Take 2,000 Units by mouth daily. Take once a day   doxycycline (VIBRA-TABS) 100 MG tablet Take 1 tablet (100 mg total) by mouth 2 (two) times daily for 5 days.   glucose blood test strip To check blood sugar twice a day with goal fasting in morning <130 and goal 2 hours after meal <180.  Write all checks down for provider visits.   Lancets (ONETOUCH ULTRASOFT) lancets  Use as instructed   levothyroxine (SYNTHROID) 75 MCG tablet Take 1 tablet (75 mcg total) by mouth daily before breakfast.   metoprolol succinate (TOPROL-XL) 25 MG 24 hr tablet Take 0.5 tablets (12.5 mg total) by mouth daily.   nitroGLYCERIN (NITROSTAT) 0.4 MG SL tablet Place 1 tablet (0.4 mg total) under the tongue every 5 (five) minutes x 3 doses as needed for up to 10 doses for chest pain.   [DISCONTINUED] aspirin EC 81 MG tablet Take 1 tablet (81 mg total) by mouth daily. Swallow whole.   [DISCONTINUED] atorvastatin (LIPITOR) 80 MG tablet Take 1 tablet (80 mg total) by mouth daily.   [DISCONTINUED] clopidogrel (PLAVIX) 75 MG tablet Take 1 tablet (75 mg total) by mouth daily with breakfast.   [DISCONTINUED] metoprolol succinate (TOPROL-XL) 25 MG 24 hr tablet Take 1 tablet (25 mg total) by mouth daily.   aspirin EC 81 MG tablet Take 1 tablet (81 mg total) by mouth daily. Swallow whole.   atorvastatin (LIPITOR) 80 MG tablet Take 1 tablet (80 mg total) by mouth daily.   clopidogrel (PLAVIX) 75 MG tablet Take 1 tablet (75 mg total) by mouth daily with breakfast.   [DISCONTINUED] Omega-3 Fatty Acids (FISH OIL) 300 MG CAPS Take by mouth. (Patient not taking: Reported on 03/15/2023)   No facility-administered encounter medications on file as of 03/18/2023.   Diagnostic Tests Reviewed/Disposition:     Latest Ref Rng & Units 03/13/2023    4:09 AM 11/27/2022    5:02 AM 11/26/2022    8:13 PM  CBC  WBC 4.0 - 10.5 K/uL 9.5  6.1  8.4   Hemoglobin 12.0 - 15.0 g/dL 16.1  09.6  04.5   Hematocrit 36.0 - 46.0 % 40.4  36.6  41.2   Platelets 150 - 400 K/uL 164  111  127        Latest Ref Rng & Units 03/13/2023    4:09 AM 11/27/2022    5:02 AM 11/26/2022    8:13 PM  CMP  Glucose 70 - 99 mg/dL 409  811  914   BUN 8 - 23 mg/dL 18  13  16    Creatinine 0.44 - 1.00 mg/dL 7.82  9.56  2.13   Sodium 135 - 145 mmol/L 139  137  133   Potassium 3.5 - 5.1 mmol/L 4.1  3.8  4.0   Chloride 98 - 111 mmol/L 107  107  103    CO2 22 - 32 mmol/L 22  22  24    Calcium 8.9 - 10.3 mg/dL 9.4  8.1  8.9   Total Protein 6.5 - 8.1 g/dL   6.7   Total Bilirubin <1.2 mg/dL   0.7   Alkaline Phos 38 - 126 U/L   84   AST 15 - 41 U/L  39   ALT 0 - 44 U/L   37     Consults: Cardiology  Discharge Instructions: Follow up with PCP within 1-2 weeks Regarding general hospital follow up and preventative care Follow up with cardiology   Disease/illness Education: Discussed at length with family and patient.  Home Health/Community Services Discussions/Referrals: Currently in place - OT/PT/nursing  Establishment or re-establishment of referral orders for community resources: None  Discussion with other health care providers: Reviewed all recent notes  Assessment and Support of treatment regimen adherence: Discussed at length with family and patient.  Appointments Coordinated with: Discussed at length with family and patient.  Has cardiology visit scheduled 03/25/23.  Education for self-management, independent living, and ADLs:  Discussed at length with family and patient.  Relevant past medical, surgical, family and social history reviewed and updated as indicated. Interim medical history since our last visit reviewed. Allergies and medications reviewed and updated.  Review of Systems  Constitutional:  Positive for appetite change (a little less, at baseline does not eat much) and fatigue. Negative for activity change, diaphoresis and fever.  Respiratory:  Negative for cough, chest tightness, shortness of breath and wheezing.   Cardiovascular:  Negative for chest pain, palpitations and leg swelling.  Gastrointestinal: Negative.   Endocrine: Negative for cold intolerance and heat intolerance.  Neurological:  Positive for dizziness (with position change), weakness (a little bit) and light-headedness (with position change). Negative for tremors, syncope, facial asymmetry and headaches.  Psychiatric/Behavioral: Negative.      Per HPI unless specifically indicated above     Objective:    BP (!) 100/59   Pulse (!) 46   Temp 97.6 F (36.4 C) (Oral)   Ht 4\' 10"  (1.473 m)   Wt 100 lb (45.4 kg)   LMP  (LMP Unknown)   SpO2 96%   BMI 20.90 kg/m   Wt Readings from Last 3 Encounters:  03/18/23 100 lb (45.4 kg)  03/12/23 102 lb (46.3 kg)  03/12/23 101 lb (45.8 kg)    Physical Exam Vitals and nursing note reviewed.  Constitutional:      General: She is awake. She is not in acute distress.    Appearance: She is well-developed and well-groomed. She is not ill-appearing or toxic-appearing.  HENT:     Head: Normocephalic.     Right Ear: Hearing and external ear normal.     Left Ear: Hearing and external ear normal.  Eyes:     General: Lids are normal.        Right eye: No discharge.        Left eye: No discharge.     Conjunctiva/sclera: Conjunctivae normal.     Pupils: Pupils are equal, round, and reactive to light.  Neck:     Thyroid: No thyromegaly.     Vascular: No carotid bruit.  Cardiovascular:     Rate and Rhythm: Regular rhythm. Bradycardia present.     Heart sounds: Normal heart sounds. No murmur heard.    No gallop.  Pulmonary:     Effort: Pulmonary effort is normal. No accessory muscle usage or respiratory distress.     Breath sounds: Normal breath sounds.  Abdominal:     General: Bowel sounds are normal. There is no distension.     Palpations: Abdomen is soft.     Tenderness: There is no abdominal tenderness.  Musculoskeletal:     Cervical back: Normal range of motion and neck supple.     Right lower leg: No edema.  Left lower leg: No edema.  Lymphadenopathy:     Cervical: No cervical adenopathy.  Skin:    General: Skin is warm and dry.     Findings: Bruising and wound present.          Comments: Multiple bruises to bilateral arms and healing skin tears to left forearm.  Neurological:     Mental Status: She is alert and oriented to person, place, and time.     Deep Tendon  Reflexes: Reflexes are normal and symmetric.     Reflex Scores:      Brachioradialis reflexes are 2+ on the right side and 2+ on the left side.      Patellar reflexes are 2+ on the right side and 2+ on the left side. Psychiatric:        Attention and Perception: Attention normal.        Mood and Affect: Mood normal.        Speech: Speech normal.        Behavior: Behavior normal. Behavior is cooperative.        Thought Content: Thought content normal.    Results for orders placed or performed during the hospital encounter of 03/12/23  CG4 I-STAT (Lactic acid)   Collection Time: 03/12/23  6:08 PM  Result Value Ref Range   Lactic Acid, Venous 1.1 0.5 - 1.9 mmol/L  POCT Activated clotting time   Collection Time: 03/12/23  6:09 PM  Result Value Ref Range   Activated Clotting Time 250 seconds  POCT Activated clotting time   Collection Time: 03/12/23  6:29 PM  Result Value Ref Range   Activated Clotting Time 740 seconds  POCT Activated clotting time   Collection Time: 03/12/23  6:37 PM  Result Value Ref Range   Activated Clotting Time 521 seconds  Glucose, capillary   Collection Time: 03/12/23  8:02 PM  Result Value Ref Range   Glucose-Capillary 149 (H) 70 - 99 mg/dL  Hemoglobin H8I   Collection Time: 03/12/23  9:15 PM  Result Value Ref Range   Hgb A1c MFr Bld 6.9 (H) 4.8 - 5.6 %   Mean Plasma Glucose 151.33 mg/dL  Glucose, capillary   Collection Time: 03/12/23  9:50 PM  Result Value Ref Range   Glucose-Capillary 126 (H) 70 - 99 mg/dL  Basic metabolic panel   Collection Time: 03/13/23  4:09 AM  Result Value Ref Range   Sodium 139 135 - 145 mmol/L   Potassium 4.1 3.5 - 5.1 mmol/L   Chloride 107 98 - 111 mmol/L   CO2 22 22 - 32 mmol/L   Glucose, Bld 135 (H) 70 - 99 mg/dL   BUN 18 8 - 23 mg/dL   Creatinine, Ser 6.96 0.44 - 1.00 mg/dL   Calcium 9.4 8.9 - 29.5 mg/dL   GFR, Estimated >28 >41 mL/min   Anion gap 10 5 - 15  CBC   Collection Time: 03/13/23  4:09 AM  Result  Value Ref Range   WBC 9.5 4.0 - 10.5 K/uL   RBC 4.39 3.87 - 5.11 MIL/uL   Hemoglobin 13.4 12.0 - 15.0 g/dL   HCT 32.4 40.1 - 02.7 %   MCV 92.0 80.0 - 100.0 fL   MCH 30.5 26.0 - 34.0 pg   MCHC 33.2 30.0 - 36.0 g/dL   RDW 25.3 66.4 - 40.3 %   Platelets 164 150 - 400 K/uL   nRBC 0.0 0.0 - 0.2 %  Lipoprotein A (LPA)   Collection Time: 03/13/23  4:09 AM  Result Value Ref Range   Lipoprotein (a) <8.4 <75.0 nmol/L  Glucose, capillary   Collection Time: 03/13/23  7:42 AM  Result Value Ref Range   Glucose-Capillary 128 (H) 70 - 99 mg/dL  ECHOCARDIOGRAM COMPLETE   Collection Time: 03/13/23  8:20 AM  Result Value Ref Range   Weight 1,632 oz   Height 58 in   BP 106/55 mmHg   Ao pk vel 1.38 m/s   AR max vel 1.60 cm2   AV Peak grad 7.6 mmHg   Single Plane A2C EF 59.2 %   Single Plane A4C EF 57.3 %   Calc EF 59.6 %   S' Lateral 2.60 cm   Area-P 1/2 2.75 cm2   P 1/2 time 807 msec   Est EF 50 - 55%   Troponin I (High Sensitivity)   Collection Time: 03/13/23  8:52 AM  Result Value Ref Range   Troponin I (High Sensitivity) 3,711 (HH) <18 ng/L  Troponin I (High Sensitivity)   Collection Time: 03/13/23 11:10 AM  Result Value Ref Range   Troponin I (High Sensitivity) 3,642 (HH) <18 ng/L  Glucose, capillary   Collection Time: 03/13/23 11:42 AM  Result Value Ref Range   Glucose-Capillary 159 (H) 70 - 99 mg/dL  Glucose, capillary   Collection Time: 03/13/23  5:09 PM  Result Value Ref Range   Glucose-Capillary 126 (H) 70 - 99 mg/dL  Glucose, capillary   Collection Time: 03/13/23  9:01 PM  Result Value Ref Range   Glucose-Capillary 134 (H) 70 - 99 mg/dL  Glucose, capillary   Collection Time: 03/14/23  8:25 AM  Result Value Ref Range   Glucose-Capillary 153 (H) 70 - 99 mg/dL  Glucose, capillary   Collection Time: 03/14/23  8:53 AM  Result Value Ref Range   Glucose-Capillary 158 (H) 70 - 99 mg/dL      Assessment & Plan:   Problem List Items Addressed This Visit        Cardiovascular and Mediastinum   Hypertension associated with diabetes (HCC)   Stable, well below goal, more hypotensive today.  Continue Metoprolol, but will reduce to 12.5 MG XL dosing daily due to bradycardia in 40's and her new dizziness with position changes.  Concern for fall risk.  Recommend she wear compression hose at home, on during day and off at night.  Educated patient and family on this.  Recommend she monitor BP at least a few mornings a week at home and document.  Obtain labs next visit.       Relevant Medications   metoprolol succinate (TOPROL-XL) 25 MG 24 hr tablet   atorvastatin (LIPITOR) 80 MG tablet   aspirin EC 81 MG tablet     Endocrine   Hashimoto's thyroiditis   Chronic, ongoing.  Continue current medication regimen and adjust as needed.  TSH and Free T4 next visit.  She has been more on hyper side last 3 checks, but she does not like medication changes and refuses to change dosing.  Has tried changes in past and had similar side effects.  Wishes to maintain 75 MCG dosing, which we will at this time and she will alert provider if any issues -- such as heart palpitations or increased anxiety. We have had multiple discussions on need to change medication dosing.      Relevant Medications   metoprolol succinate (TOPROL-XL) 25 MG 24 hr tablet   Hyperlipidemia associated with type 2 diabetes mellitus (HCC)   Chronic, ongoing.  Continue current medication regimen and adjust as needed.  Can consider discontinuation of this if patient wishes to minimize medications in future.  Lipid panel today.      Relevant Medications   metoprolol succinate (TOPROL-XL) 25 MG 24 hr tablet   atorvastatin (LIPITOR) 80 MG tablet   aspirin EC 81 MG tablet   Type 2 diabetes, diet controlled (HCC) - Primary   Chronic, stable, diet-controlled.  A1c 6.9% in hospital and previous 6.7% -- slight trend up, she prefers not to start medication.  Urine micro 22 April 2022, recheck next visit.  Recommend continued focus on diet at this time, discussed with her at length benefit of adding on medication -- however she refuses to start at this time.  Due to advanced age more lenient goal recommended, A1C <8 per geriatric society.  Avoid hypoglycemia.  Initiate medication in future if needed, could consider and SGLT2 for heart health. Glucometer supplies sent in and family + patient will monitor at home. - Foot and eye exams up to date. - Statin on board, no ACE/ARB due to lower BP levels at baseline. - Vaccines up to date.      Relevant Medications   atorvastatin (LIPITOR) 80 MG tablet   aspirin EC 81 MG tablet     Musculoskeletal and Integument   Sebaceous cyst   Abdomen, left lower.  Repacked area today, less packing needed. No tenderness with dressing change.  There is mild odor today and drainage.  Will restart Doxycycline BID for 5 days.  Educated family on how to clean and change dressing at home.  They have packing materials and dressings.        Hematopoietic and Hemostatic   Thrombocytopenia (HCC)   Improved at discharge, continue to monitor.  Repeat labs next visit, defer today due to difficult lab draw and multiple recent access performed.          Other   Bradycardia   At baseline had bradycardia, but currently lower and having symptoms with this.  Will reduce Metoprolol XL to 12.5 MG daily, educated family and patient.  Recommend compression hose on during day and off at night.  Ensure good fluid intake at home.  Concern for falls with current symptoms, discussed with patient.      History of ST elevation myocardial infarction (STEMI)   Currently stable with no further CP or pressure.  Continue current medication regimen.  Plavix and ASA for 1 year recommended by cardiology (stop date 03/13/24).  Will continue collaboration with cardiology.      Jaw hematoma, initial encounter   Due to injury while being transported by EMS.  Slight swelling remains to jaw line, but  bruising improving. Monitor closely.  Recommend application of ice to area at home and can take Tylenol as needed.        Follow up plan: Return in about 5 days (around 03/23/2023) for Wound check and bradycardia.

## 2023-03-21 NOTE — Patient Instructions (Signed)

## 2023-03-23 ENCOUNTER — Ambulatory Visit: Admitting: Nurse Practitioner

## 2023-03-23 ENCOUNTER — Encounter: Payer: Self-pay | Admitting: Nurse Practitioner

## 2023-03-23 ENCOUNTER — Other Ambulatory Visit: Payer: Self-pay

## 2023-03-23 VITALS — BP 112/67 | HR 50 | Temp 97.9°F | Wt 100.8 lb

## 2023-03-23 DIAGNOSIS — S31109A Unspecified open wound of abdominal wall, unspecified quadrant without penetration into peritoneal cavity, initial encounter: Secondary | ICD-10-CM | POA: Diagnosis not present

## 2023-03-23 DIAGNOSIS — L723 Sebaceous cyst: Secondary | ICD-10-CM | POA: Diagnosis not present

## 2023-03-23 DIAGNOSIS — I252 Old myocardial infarction: Secondary | ICD-10-CM

## 2023-03-23 DIAGNOSIS — R531 Weakness: Secondary | ICD-10-CM | POA: Diagnosis not present

## 2023-03-23 NOTE — Progress Notes (Signed)
 BP 112/67   Pulse (!) 50   Temp 97.9 F (36.6 C) (Oral)   Wt 100 lb 12.8 oz (45.7 kg)   LMP  (LMP Unknown)   SpO2 98%   BMI 21.07 kg/m    Subjective:    Patient ID: Kirsten Riley, female    DOB: 1931-10-07, 89 y.o.   MRN: [id number]  HPI: Kirsten Riley is a 89 y.o. female  Chief Complaint  Patient presents with   Wound Check   SKIN INFECTION To left lower abdomen, cyst removed 03/05/23.  Currently on Doxycycline, about completed, has 5 more doses.  Reports less pain to area and has been out working in the yard.  Would like a small walker for mobility, as still recovering from recent MI. Duration: days Location: left lower abdomen History of trauma in area: no Pain: yes Quality: yes Severity: mild Redness: yes Swelling: no Oozing: some drainage to dressing Pus: no Fevers: no Nausea/vomiting: no Status: stable Treatments attempted:antibiotics and removal  Tetanus: UTD   Relevant past medical, surgical, family and social history reviewed and updated as indicated. Interim medical history since our last visit reviewed. Allergies and medications reviewed and updated.  Review of Systems  Constitutional:  Negative for activity change, appetite change, diaphoresis, fatigue and fever.  Respiratory:  Negative for cough, chest tightness and shortness of breath.   Cardiovascular:  Negative for chest pain, palpitations and leg swelling.  Gastrointestinal: Negative.   Endocrine: Negative.   Skin:  Positive for wound.  Neurological: Negative.   Psychiatric/Behavioral: Negative.    All other systems reviewed and are negative.   Per HPI unless specifically indicated above     Objective:    BP 112/67   Pulse (!) 50   Temp 97.9 F (36.6 C) (Oral)   Wt 100 lb 12.8 oz (45.7 kg)   LMP  (LMP Unknown)   SpO2 98%   BMI 21.07 kg/m   Wt Readings from Last 3 Encounters:  03/23/23 100 lb 12.8 oz (45.7 kg)  03/18/23 100 lb (45.4 kg)  03/12/23 102 lb (46.3 kg)    Physical  Exam Vitals and nursing note reviewed.  Constitutional:      General: She is awake. She is not in acute distress.    Appearance: She is well-developed and well-groomed. She is not ill-appearing or toxic-appearing.  HENT:     Head: Normocephalic.     Right Ear: Hearing and external ear normal.     Left Ear: Hearing and external ear normal.  Eyes:     General: Lids are normal.        Right eye: No discharge.        Left eye: No discharge.     Conjunctiva/sclera: Conjunctivae normal.     Pupils: Pupils are equal, round, and reactive to light.  Neck:     Thyroid: No thyromegaly.     Vascular: No carotid bruit.  Cardiovascular:     Rate and Rhythm: Regular rhythm. Bradycardia present.     Heart sounds: Normal heart sounds. No murmur heard.    No gallop.  Pulmonary:     Effort: Pulmonary effort is normal. No accessory muscle usage or respiratory distress.     Breath sounds: Normal breath sounds.  Abdominal:     General: Bowel sounds are normal. There is no distension.     Palpations: Abdomen is soft.     Tenderness: There is no abdominal tenderness.  Musculoskeletal:     Cervical back:  Normal range of motion and neck supple.     Right lower leg: No edema.     Left lower leg: No edema.  Lymphadenopathy:     Cervical: No cervical adenopathy.  Skin:    General: Skin is warm and dry.     Findings: Wound present.       Neurological:     Mental Status: She is alert and oriented to person, place, and time.     Deep Tendon Reflexes: Reflexes are normal and symmetric.     Reflex Scores:      Brachioradialis reflexes are 2+ on the right side and 2+ on the left side.      Patellar reflexes are 2+ on the right side and 2+ on the left side. Psychiatric:        Attention and Perception: Attention normal.        Mood and Affect: Mood normal.        Speech: Speech normal.        Behavior: Behavior normal. Behavior is cooperative.        Thought Content: Thought content normal.      Results for orders placed or performed during the hospital encounter of 03/12/23  CG4 I-STAT (Lactic acid)   Collection Time: 03/12/23  6:08 PM  Result Value Ref Range   Lactic Acid, Venous 1.1 0.5 - 1.9 mmol/L  POCT Activated clotting time   Collection Time: 03/12/23  6:09 PM  Result Value Ref Range   Activated Clotting Time 250 seconds  POCT Activated clotting time   Collection Time: 03/12/23  6:29 PM  Result Value Ref Range   Activated Clotting Time 740 seconds  POCT Activated clotting time   Collection Time: 03/12/23  6:37 PM  Result Value Ref Range   Activated Clotting Time 521 seconds  Glucose, capillary   Collection Time: 03/12/23  8:02 PM  Result Value Ref Range   Glucose-Capillary 149 (H) 70 - 99 mg/dL  Hemoglobin Z6X   Collection Time: 03/12/23  9:15 PM  Result Value Ref Range   Hgb A1c MFr Bld 6.9 (H) 4.8 - 5.6 %   Mean Plasma Glucose 151.33 mg/dL  Glucose, capillary   Collection Time: 03/12/23  9:50 PM  Result Value Ref Range   Glucose-Capillary 126 (H) 70 - 99 mg/dL  Basic metabolic panel   Collection Time: 03/13/23  4:09 AM  Result Value Ref Range   Sodium 139 135 - 145 mmol/L   Potassium 4.1 3.5 - 5.1 mmol/L   Chloride 107 98 - 111 mmol/L   CO2 22 22 - 32 mmol/L   Glucose, Bld 135 (H) 70 - 99 mg/dL   BUN 18 8 - 23 mg/dL   Creatinine, Ser 0.96 0.44 - 1.00 mg/dL   Calcium 9.4 8.9 - 04.5 mg/dL   GFR, Estimated >40 >98 mL/min   Anion gap 10 5 - 15  CBC   Collection Time: 03/13/23  4:09 AM  Result Value Ref Range   WBC 9.5 4.0 - 10.5 K/uL   RBC 4.39 3.87 - 5.11 MIL/uL   Hemoglobin 13.4 12.0 - 15.0 g/dL   HCT 11.9 14.7 - 82.9 %   MCV 92.0 80.0 - 100.0 fL   MCH 30.5 26.0 - 34.0 pg   MCHC 33.2 30.0 - 36.0 g/dL   RDW 56.2 13.0 - 86.5 %   Platelets 164 150 - 400 K/uL   nRBC 0.0 0.0 - 0.2 %  Lipoprotein A (LPA)   Collection Time: 03/13/23  4:09 AM  Result Value Ref Range   Lipoprotein (a) <8.4 <75.0 nmol/L  Glucose, capillary   Collection Time:  03/13/23  7:42 AM  Result Value Ref Range   Glucose-Capillary 128 (H) 70 - 99 mg/dL  ECHOCARDIOGRAM COMPLETE   Collection Time: 03/13/23  8:20 AM  Result Value Ref Range   Weight 1,632 oz   Height 58 in   BP 106/55 mmHg   Ao pk vel 1.38 m/s   AR max vel 1.60 cm2   AV Peak grad 7.6 mmHg   Single Plane A2C EF 59.2 %   Single Plane A4C EF 57.3 %   Calc EF 59.6 %   S' Lateral 2.60 cm   Area-P 1/2 2.75 cm2   P 1/2 time 807 msec   Est EF 50 - 55%   Troponin I (High Sensitivity)   Collection Time: 03/13/23  8:52 AM  Result Value Ref Range   Troponin I (High Sensitivity) 3,711 (HH) <18 ng/L  Troponin I (High Sensitivity)   Collection Time: 03/13/23 11:10 AM  Result Value Ref Range   Troponin I (High Sensitivity) 3,642 (HH) <18 ng/L  Glucose, capillary   Collection Time: 03/13/23 11:42 AM  Result Value Ref Range   Glucose-Capillary 159 (H) 70 - 99 mg/dL  Glucose, capillary   Collection Time: 03/13/23  5:09 PM  Result Value Ref Range   Glucose-Capillary 126 (H) 70 - 99 mg/dL  Glucose, capillary   Collection Time: 03/13/23  9:01 PM  Result Value Ref Range   Glucose-Capillary 134 (H) 70 - 99 mg/dL  Glucose, capillary   Collection Time: 03/14/23  8:25 AM  Result Value Ref Range   Glucose-Capillary 153 (H) 70 - 99 mg/dL  Glucose, capillary   Collection Time: 03/14/23  8:53 AM  Result Value Ref Range   Glucose-Capillary 158 (H) 70 - 99 mg/dL      Assessment & Plan:   Problem List Items Addressed This Visit       Musculoskeletal and Integument   Sebaceous cyst - Primary   Abdomen, left lower.  Dressing changed. No tenderness with dressing change.  Some drainage and mild odor remains.  Continue Doxycycline BID until complete.  Educated family on how to clean and change dressing at home.  Will add on wound care in home to her current home health with [facility].  Discussed with family and patient.  This will assist in patient transportation having to come to office as often, wound  care in home.      Relevant Orders   Ambulatory referral to Home Health     Other   History of ST elevation myocardial infarction (STEMI)   Currently stable with no further CP or pressure.  Continue current medication regimen.  Plavix and ASA for 1 year recommended by cardiology (stop date 03/13/24).  Will continue collaboration with cardiology. Order for small walker to assist with mobility.      Other Visit Diagnoses       Wound of abdomen       Refer to cyst plan of care.   Relevant Orders   Ambulatory referral to Home Health     Weakness       Order for small walker to assist with her mobility and prevent falls.        Follow up plan: Return in about 1 week (around 03/30/2023) for Wound check.
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
## 2023-03-23 NOTE — Patient Instructions (Signed)
 Visit Information  Thank you for taking time to visit with me today. Please don't hesitate to contact me if I can be of assistance to you before our next scheduled telephone appointment.  Following is a copy of your care plan:   Goals Addressed   None     Patient verbalizes understanding of instructions and care plan provided today and agrees to view in MyChart. Active MyChart status and patient understanding of how to access instructions and care plan via MyChart confirmed with patient.     The patient has been provided with contact information for the care management team and has been advised to call with any health related questions or concerns.   Please call the care guide team at [phone] if you need to cancel or reschedule your appointment.   Please call the Suicide and Crisis Lifeline: 988 call the [place] National Suicide Prevention Lifeline: 812-666-7616 or TTY: 801-043-5178 TTY 270-007-6084) to talk to a trained counselor if you are experiencing a Mental Health or Behavioral Health Crisis or need someone to talk to.  Deidre Ala, BSN, RN [place]  [facility] - [employer] [facility] RN Care Manager [phone]

## 2023-03-23 NOTE — Assessment & Plan Note (Signed)
 Currently stable with no further CP or pressure.  Continue current medication regimen.  Plavix and ASA for 1 year recommended by cardiology (stop date 03/13/24).  Will continue collaboration with cardiology. Order for small walker to assist with mobility.

## 2023-03-23 NOTE — Assessment & Plan Note (Signed)
 Abdomen, left lower.  Dressing changed. No tenderness with dressing change.  Some drainage and mild odor remains.  Continue Doxycycline BID until complete.  Educated family on how to clean and change dressing at home.  Will add on wound care in home to her current home health with [facility].  Discussed with family and patient.  This will assist in patient transportation having to come to office as often, wound care in home.

## 2023-03-25 DIAGNOSIS — Z955 Presence of coronary angioplasty implant and graft: Secondary | ICD-10-CM | POA: Insufficient documentation

## 2023-03-26 ENCOUNTER — Telehealth: Payer: Self-pay | Admitting: Nurse Practitioner

## 2023-03-26 DIAGNOSIS — Z48812 Encounter for surgical aftercare following surgery on the circulatory system: Secondary | ICD-10-CM | POA: Diagnosis not present

## 2023-03-26 DIAGNOSIS — Z951 Presence of aortocoronary bypass graft: Secondary | ICD-10-CM

## 2023-03-26 DIAGNOSIS — S0083XD Contusion of other part of head, subsequent encounter: Secondary | ICD-10-CM | POA: Diagnosis not present

## 2023-03-26 DIAGNOSIS — E039 Hypothyroidism, unspecified: Secondary | ICD-10-CM

## 2023-03-26 DIAGNOSIS — I2119 ST elevation (STEMI) myocardial infarction involving other coronary artery of inferior wall: Secondary | ICD-10-CM | POA: Diagnosis not present

## 2023-03-26 DIAGNOSIS — I119 Hypertensive heart disease without heart failure: Secondary | ICD-10-CM

## 2023-03-26 DIAGNOSIS — M353 Polymyalgia rheumatica: Secondary | ICD-10-CM

## 2023-03-26 DIAGNOSIS — S2020XD Contusion of thorax, unspecified, subsequent encounter: Secondary | ICD-10-CM

## 2023-03-26 DIAGNOSIS — I2510 Atherosclerotic heart disease of native coronary artery without angina pectoris: Secondary | ICD-10-CM | POA: Diagnosis not present

## 2023-03-26 DIAGNOSIS — E119 Type 2 diabetes mellitus without complications: Secondary | ICD-10-CM

## 2023-03-26 DIAGNOSIS — E7800 Pure hypercholesterolemia, unspecified: Secondary | ICD-10-CM

## 2023-03-26 DIAGNOSIS — Z955 Presence of coronary angioplasty implant and graft: Secondary | ICD-10-CM

## 2023-03-26 NOTE — Telephone Encounter (Signed)
 Copied from CRM [id number]. Topic: Clinical - Prescription Issue >> Mar 26, 2023 11:53 AM Shelah Lewandowsky wrote: Reason for CRM: Kirsten Riley with [name] called to inform that they do  not provide walkers, they only assist with respiratory equipment

## 2023-03-26 NOTE — Telephone Encounter (Signed)
 Noted. Will redirect order via [facility].

## 2023-03-28 NOTE — Patient Instructions (Signed)

## 2023-04-01 ENCOUNTER — Encounter: Payer: Self-pay | Admitting: Nurse Practitioner

## 2023-04-01 ENCOUNTER — Ambulatory Visit (INDEPENDENT_AMBULATORY_CARE_PROVIDER_SITE_OTHER): Admitting: Nurse Practitioner

## 2023-04-01 VITALS — BP 112/61 | HR 56 | Temp 97.8°F | Ht <= 58 in | Wt 99.8 lb

## 2023-04-01 DIAGNOSIS — L723 Sebaceous cyst: Secondary | ICD-10-CM

## 2023-04-01 NOTE — Assessment & Plan Note (Signed)
 Abdomen, left lower.  Healing, crusted over and smaller.  Have nursing continue to monitor in home.  Overall much improved.

## 2023-04-01 NOTE — Progress Notes (Signed)
 BP 112/61   Pulse (!) 56   Temp 97.8 F (36.6 C) (Oral)   Ht 4\' 10"  (1.473 m)   Wt 99 lb 12.8 oz (45.3 kg)   LMP  (LMP Unknown)   SpO2 98%   BMI 20.86 kg/m    Subjective:    Patient ID: Kirsten Riley, female    DOB: 1931-06-24, 89 y.o.   MRN: [id number]  HPI: Kirsten Riley is a 89 y.o. female  Chief Complaint  Patient presents with   Wound Check   SKIN INFECTION To left lower abdomen, cyst removed 03/05/23. Nurse has been coming into home and monitoring.  Has completed Doxycycline. Duration: days Location: left lower abdomen History of trauma in area: no Pain: no Quality: none Severity: none Redness: no Swelling: no Oozing: no Pus: no Fevers: no Nausea/vomiting: no Status: stable Treatments attempted:antibiotics and removal  Tetanus: UTD   Relevant past medical, surgical, family and social history reviewed and updated as indicated. Interim medical history since our last visit reviewed. Allergies and medications reviewed and updated.  Review of Systems  Constitutional:  Negative for activity change, appetite change, diaphoresis, fatigue and fever.  Respiratory:  Negative for cough, chest tightness and shortness of breath.   Cardiovascular:  Negative for chest pain, palpitations and leg swelling.  Gastrointestinal: Negative.   Endocrine: Negative.   Skin:  Positive for wound.  Neurological: Negative.   Psychiatric/Behavioral: Negative.    All other systems reviewed and are negative.   Per HPI unless specifically indicated above     Objective:    BP 112/61   Pulse (!) 56   Temp 97.8 F (36.6 C) (Oral)   Ht 4\' 10"  (1.473 m)   Wt 99 lb 12.8 oz (45.3 kg)   LMP  (LMP Unknown)   SpO2 98%   BMI 20.86 kg/m   Wt Readings from Last 3 Encounters:  04/01/23 99 lb 12.8 oz (45.3 kg)  03/23/23 100 lb 12.8 oz (45.7 kg)  03/18/23 100 lb (45.4 kg)    Physical Exam Vitals and nursing note reviewed.  Constitutional:      General: She is awake. She is not in acute  distress.    Appearance: She is well-developed and well-groomed. She is not ill-appearing or toxic-appearing.  HENT:     Head: Normocephalic.     Right Ear: Hearing and external ear normal.     Left Ear: Hearing and external ear normal.  Eyes:     General: Lids are normal.        Right eye: No discharge.        Left eye: No discharge.     Conjunctiva/sclera: Conjunctivae normal.     Pupils: Pupils are equal, round, and reactive to light.  Neck:     Thyroid: No thyromegaly.     Vascular: No carotid bruit.  Cardiovascular:     Rate and Rhythm: Regular rhythm. Bradycardia present.     Heart sounds: Normal heart sounds. No murmur heard.    No gallop.  Pulmonary:     Effort: Pulmonary effort is normal. No accessory muscle usage or respiratory distress.     Breath sounds: Normal breath sounds.  Abdominal:     General: Bowel sounds are normal. There is no distension.     Palpations: Abdomen is soft.     Tenderness: There is no abdominal tenderness.  Musculoskeletal:     Cervical back: Normal range of motion and neck supple.     Right lower  leg: No edema.     Left lower leg: No edema.  Lymphadenopathy:     Cervical: No cervical adenopathy.  Skin:    General: Skin is warm and dry.     Findings: Wound present.       Neurological:     Mental Status: She is alert and oriented to person, place, and time.     Deep Tendon Reflexes: Reflexes are normal and symmetric.     Reflex Scores:      Brachioradialis reflexes are 2+ on the right side and 2+ on the left side.      Patellar reflexes are 2+ on the right side and 2+ on the left side. Psychiatric:        Attention and Perception: Attention normal.        Mood and Affect: Mood normal.        Speech: Speech normal.        Behavior: Behavior normal. Behavior is cooperative.        Thought Content: Thought content normal.     Results for orders placed or performed during the hospital encounter of 03/12/23  CG4 I-STAT (Lactic acid)    Collection Time: 03/12/23  6:08 PM  Result Value Ref Range   Lactic Acid, Venous 1.1 0.5 - 1.9 mmol/L  POCT Activated clotting time   Collection Time: 03/12/23  6:09 PM  Result Value Ref Range   Activated Clotting Time 250 seconds  POCT Activated clotting time   Collection Time: 03/12/23  6:29 PM  Result Value Ref Range   Activated Clotting Time 740 seconds  POCT Activated clotting time   Collection Time: 03/12/23  6:37 PM  Result Value Ref Range   Activated Clotting Time 521 seconds  Glucose, capillary   Collection Time: 03/12/23  8:02 PM  Result Value Ref Range   Glucose-Capillary 149 (H) 70 - 99 mg/dL  Hemoglobin Z6X   Collection Time: 03/12/23  9:15 PM  Result Value Ref Range   Hgb A1c MFr Bld 6.9 (H) 4.8 - 5.6 %   Mean Plasma Glucose 151.33 mg/dL  Glucose, capillary   Collection Time: 03/12/23  9:50 PM  Result Value Ref Range   Glucose-Capillary 126 (H) 70 - 99 mg/dL  Basic metabolic panel   Collection Time: 03/13/23  4:09 AM  Result Value Ref Range   Sodium 139 135 - 145 mmol/L   Potassium 4.1 3.5 - 5.1 mmol/L   Chloride 107 98 - 111 mmol/L   CO2 22 22 - 32 mmol/L   Glucose, Bld 135 (H) 70 - 99 mg/dL   BUN 18 8 - 23 mg/dL   Creatinine, Ser 0.96 0.44 - 1.00 mg/dL   Calcium 9.4 8.9 - 04.5 mg/dL   GFR, Estimated >40 >98 mL/min   Anion gap 10 5 - 15  CBC   Collection Time: 03/13/23  4:09 AM  Result Value Ref Range   WBC 9.5 4.0 - 10.5 K/uL   RBC 4.39 3.87 - 5.11 MIL/uL   Hemoglobin 13.4 12.0 - 15.0 g/dL   HCT 11.9 14.7 - 82.9 %   MCV 92.0 80.0 - 100.0 fL   MCH 30.5 26.0 - 34.0 pg   MCHC 33.2 30.0 - 36.0 g/dL   RDW 56.2 13.0 - 86.5 %   Platelets 164 150 - 400 K/uL   nRBC 0.0 0.0 - 0.2 %  Lipoprotein A (LPA)   Collection Time: 03/13/23  4:09 AM  Result Value Ref Range   Lipoprotein (a) <8.4 <  75.0 nmol/L  Glucose, capillary   Collection Time: 03/13/23  7:42 AM  Result Value Ref Range   Glucose-Capillary 128 (H) 70 - 99 mg/dL  ECHOCARDIOGRAM COMPLETE    Collection Time: 03/13/23  8:20 AM  Result Value Ref Range   Weight 1,632 oz   Height 58 in   BP 106/55 mmHg   Ao pk vel 1.38 m/s   AR max vel 1.60 cm2   AV Peak grad 7.6 mmHg   Single Plane A2C EF 59.2 %   Single Plane A4C EF 57.3 %   Calc EF 59.6 %   S' Lateral 2.60 cm   Area-P 1/2 2.75 cm2   P 1/2 time 807 msec   Est EF 50 - 55%   Troponin I (High Sensitivity)   Collection Time: 03/13/23  8:52 AM  Result Value Ref Range   Troponin I (High Sensitivity) 3,711 (HH) <18 ng/L  Troponin I (High Sensitivity)   Collection Time: 03/13/23 11:10 AM  Result Value Ref Range   Troponin I (High Sensitivity) 3,642 (HH) <18 ng/L  Glucose, capillary   Collection Time: 03/13/23 11:42 AM  Result Value Ref Range   Glucose-Capillary 159 (H) 70 - 99 mg/dL  Glucose, capillary   Collection Time: 03/13/23  5:09 PM  Result Value Ref Range   Glucose-Capillary 126 (H) 70 - 99 mg/dL  Glucose, capillary   Collection Time: 03/13/23  9:01 PM  Result Value Ref Range   Glucose-Capillary 134 (H) 70 - 99 mg/dL  Glucose, capillary   Collection Time: 03/14/23  8:25 AM  Result Value Ref Range   Glucose-Capillary 153 (H) 70 - 99 mg/dL  Glucose, capillary   Collection Time: 03/14/23  8:53 AM  Result Value Ref Range   Glucose-Capillary 158 (H) 70 - 99 mg/dL      Assessment & Plan:   Problem List Items Addressed This Visit       Musculoskeletal and Integument   Sebaceous cyst - Primary   Abdomen, left lower.  Healing, crusted over and smaller.  Have nursing continue to monitor in home.  Overall much improved.         Follow up plan: Return for as scheduled April 8th.

## 2023-04-11 ENCOUNTER — Encounter: Payer: Self-pay | Admitting: Nurse Practitioner

## 2023-04-12 ENCOUNTER — Ambulatory Visit: Payer: Self-pay

## 2023-04-12 ENCOUNTER — Encounter: Payer: Self-pay | Admitting: Internal Medicine

## 2023-04-12 ENCOUNTER — Ambulatory Visit (INDEPENDENT_AMBULATORY_CARE_PROVIDER_SITE_OTHER)
Admission: RE | Admit: 2023-04-12 | Discharge: 2023-04-12 | Disposition: A | Discharge status: Home / Self Care | Source: Ambulatory Visit | Attending: Internal Medicine | Admitting: Internal Medicine

## 2023-04-12 ENCOUNTER — Ambulatory Visit: Admitting: Internal Medicine

## 2023-04-12 VITALS — BP 100/58 | HR 62 | Temp 98.4°F | Ht <= 58 in | Wt 102.0 lb

## 2023-04-12 DIAGNOSIS — J101 Influenza due to other identified influenza virus with other respiratory manifestations: Secondary | ICD-10-CM | POA: Diagnosis not present

## 2023-04-12 DIAGNOSIS — R059 Cough, unspecified: Secondary | ICD-10-CM | POA: Insufficient documentation

## 2023-04-12 DIAGNOSIS — R051 Acute cough: Secondary | ICD-10-CM

## 2023-04-12 LAB — POCT INFLUENZA A/B: Influenza A, POC: POSITIVE — AB

## 2023-04-12 MED ORDER — OSELTAMIVIR PHOSPHATE 75 MG PO CAPS: 75.0000 mg | ORAL_CAPSULE | Freq: Two times a day (BID) | ORAL | 0 refills | Status: DC

## 2023-04-12 NOTE — Assessment & Plan Note (Signed)
 Faint positive after the reading time Will treat with tamiflu--just in case

## 2023-04-12 NOTE — Progress Notes (Signed)
 Subjective:    Patient ID: Kirsten Riley, female    DOB: May 16, 1931, 89 y.o.   MRN: [id number]  HPI Here due to cough, fever and confusion With son and daughter  Has had a cough "for a good while"---son notes it was really since yesterday Family has cameras--and they have seen trouble even walking right Trouble getting out of chair--reaching for stuff without knowing why (when daughter was there) Some fever yesterday Pain with cough Cough is dry--but "sounds moist" per son  Similar to when she had COVID in November  MI about a month ago Had been slowly improving since then--till yesterday  Current Outpatient Medications on File Prior to Visit  Medication Sig Dispense Refill   aspirin EC 81 MG tablet Take 1 tablet (81 mg total) by mouth daily. Swallow whole. 90 tablet 1   atorvastatin (LIPITOR) 80 MG tablet Take 1 tablet (80 mg total) by mouth daily. 90 tablet 2   Blood Glucose Monitoring Suppl (ONETOUCH VERIO) w/Device KIT To check blood sugar twice a day with goal fasting in morning <130 and goal 2 hours after meal <180.  Write all checks down for provider visits. 1 kit 1   cholecalciferol (VITAMIN D3) 25 MCG (1000 UNIT) tablet Take 2,000 Units by mouth daily. Take once a day     clopidogrel (PLAVIX) 75 MG tablet Take 1 tablet (75 mg total) by mouth daily with breakfast. 90 tablet 1   glucose blood test strip To check blood sugar twice a day with goal fasting in morning <130 and goal 2 hours after meal <180.  Write all checks down for provider visits. 100 each 5   Lancets (ONETOUCH ULTRASOFT) lancets Use as instructed 100 each 12   levothyroxine (SYNTHROID) 75 MCG tablet Take 1 tablet (75 mcg total) by mouth daily before breakfast. 90 tablet 4   nitroGLYCERIN (NITROSTAT) 0.4 MG SL tablet Place 1 tablet (0.4 mg total) under the tongue every 5 (five) minutes x 3 doses as needed for up to 10 doses for chest pain. 10 tablet 0   Omega-3 Fatty Acids (FISH OIL) 1000 MG CAPS Take 1 capsule by  mouth daily.     No current facility-administered medications on file prior to visit.    Allergies  Allergen Reactions   Sulfa Antibiotics Other (See Comments)   Penicillins Rash    Past Medical History:  Diagnosis Date   CAD (coronary artery disease)    Diabetes mellitus (HCC) 12/10/2014   Fractured rib    Hyperlipidemia    Hypertension    Hypothyroidism    Polymyalgia rheumatica (HCC) 06/08/2014    Past Surgical History:  Procedure Laterality Date   ABDOMINAL HYSTERECTOMY  1978   partial   CORONARY ARTERY BYPASS GRAFT     CORONARY/GRAFT ACUTE MI REVASCULARIZATION N/A 03/12/2023   Procedure: Coronary/Graft Acute MI Revascularization;  Surgeon: Marcina Millard, MD;  Location: [facility] INVASIVE CV LAB;  Service: Cardiovascular;  Laterality: N/A;   hypercholesterol     hypothyroid     LEFT HEART CATH AND CORONARY ANGIOGRAPHY N/A 03/12/2023   Procedure: LEFT HEART CATH AND CORONARY ANGIOGRAPHY;  Surgeon: Marcina Millard, MD;  Location: [facility] INVASIVE CV LAB;  Service: Cardiovascular;  Laterality: N/A;    Family History  Problem Relation Age of Onset   Heart disease Mother    Stroke Mother    Hypertension Mother    Cancer Father    Cancer Sister        breast, ovarian, pancreatic  Heart disease Sister        x2   Cancer Brother    Heart disease Brother    Cancer Brother    Heart disease Brother    Cancer Brother    Heart disease Brother     Social History   Socioeconomic History   Marital status: Widowed    Spouse name: Not on file   Number of children: Not on file   Years of education: Not on file   Highest education level: 12th grade  Occupational History   Occupation: retired  Tobacco Use   Smoking status: Never   Smokeless tobacco: Never  Vaping Use   Vaping status: Never Used  Substance and Sexual Activity   Alcohol use: No    Alcohol/week: 0.0 standard drinks of alcohol   Drug use: No   Sexual activity: Never  Other Topics Concern   Not  on file  Social History Narrative   Not on file   Social Drivers of Health   Financial Resource Strain: Low Risk  (03/17/2023)   Overall Financial Resource Strain (CARDIA)    Difficulty of Paying Living Expenses: Not very hard  Food Insecurity: No Food Insecurity (03/23/2023)   Hunger Vital Sign    Worried About Running Out of Food in the Last Year: Never true    Ran Out of Food in the Last Year: Never true  Transportation Needs: No Transportation Needs (03/23/2023)   PRAPARE - Administrator, Civil Service (Medical): No    Lack of Transportation (Non-Medical): No  Physical Activity: Insufficiently Active (07/07/2022)   Exercise Vital Sign    Days of Exercise per Week: 3 days    Minutes of Exercise per Session: 30 min  Stress: No Stress Concern Present (07/07/2022)   [employer] of Occupational Health - Occupational Stress Questionnaire    Feeling of Stress : Not at all  Social Connections: Moderately Isolated (03/12/2023)   Social Connection and Isolation Panel [NHANES]    Frequency of Communication with Friends and Family: More than three times a week    Frequency of Social Gatherings with Friends and Family: Twice a week    Attends Religious Services: More than 4 times per year    Active Member of [employer] or Organizations: No    Attends Banker Meetings: Never    Marital Status: Widowed  Intimate Partner Violence: Not At Risk (03/23/2023)   Humiliation, Afraid, Rape, and Kick questionnaire    Fear of Current or Ex-Partner: No    Emotionally Abused: No    Physically Abused: No    Sexually Abused: No   Review of Systems No ill exposures No N/V Eating per her usual    Objective:   Physical Exam Constitutional:      General: She is not in acute distress. HENT:     Mouth/Throat:     Pharynx: No oropharyngeal exudate or posterior oropharyngeal erythema.  Cardiovascular:     Rate and Rhythm: Normal rate and regular rhythm.     Heart sounds:     No  gallop.     Comments: Soft systolic murmur LUSB Pulmonary:     Effort: Pulmonary effort is normal.     Breath sounds: No wheezing.     Comments: Slight early inspiratory crackles in LLL Musculoskeletal:     Cervical back: Neck supple.  Lymphadenopathy:     Cervical: No cervical adenopathy.  Neurological:     Mental Status: She is alert.  Assessment & Plan:

## 2023-04-12 NOTE — Telephone Encounter (Signed)
 Noted and sending to PCP team to review.

## 2023-04-12 NOTE — Telephone Encounter (Signed)
 Patient already scheduled to see someone at [facility] today.

## 2023-04-12 NOTE — Assessment & Plan Note (Addendum)
 With fever Suspicious for pneumonia Flu test negative  CXR looks okay---improvement in some LLL findings (that were cartilage on CT scan) Recheck of the flu test showed a faint positive--though after the reading time Will try empiric tamiflu because of this

## 2023-04-12 NOTE — Addendum Note (Signed)
 Addended by: Eual Fines on: 04/12/2023 11:32 AM   Modules accepted: Orders

## 2023-04-12 NOTE — Telephone Encounter (Signed)
 Chief Complaint: Weakness Symptoms: increased confusion, cough, fever Frequency: Began yesterday Pertinent Negatives: Patient denies SOB, CP Disposition: [] ED /[] Urgent Care (no appt availability in office) / [x] Appointment(In office/virtual)/ []  [place] [facility]/ [] Home Care/ [] Refused Recommended Disposition /[] [place] [facility]/ []  Follow-up with PCP Additional Notes: Pt's daughter reports pt began with fever, increased confusion, weakness and cough yesterday. Recent MI in February, pt started on Baby ASA/Plavix upon discharge. Denies SOB, CP. OV scheduled today. This RN educated pt on home care, new-worsening symptoms, when to call back/seek emergent care. Pt verbalized understanding and agrees to plan.    Copied from CRM [id number]. Topic: Clinical - Red Word Triage >> Apr 12, 2023  9:42 AM Elle L wrote: Red Word that prompted transfer to Nurse Triage: The patient's daughter, Elease Hashimoto, states the patient has a worsening cough, she is weak, has a fever, and she is unable to walk well. The patient had a heart attack at the end of February. Reason for Disposition  [1] MODERATE weakness (i.e., interferes with work, school, normal activities) AND [2] cause unknown  (Exceptions: Weakness from acute minor illness or poor fluid intake; weakness is chronic and not worse.)  Answer Assessment - Initial Assessment Questions 1. DESCRIPTION: "Describe how you are feeling."     Increased difficulty ambulating 2. SEVERITY: "How bad is it?"  "Can you stand and walk?"   - MILD (0-3): Feels weak or tired, but does not interfere with work, school or normal activities.   - MODERATE (4-7): Able to stand and walk; weakness interferes with work, school, or normal activities.   - SEVERE (8-10): Unable to stand or walk; unable to do usual activities.     Moderate 3. ONSET: "When did these symptoms begin?" (e.g., hours, days, weeks, months)     Began yesterday 4. CAUSE: "What do you think is causing the  weakness or fatigue?" (e.g., not drinking enough fluids, medical problem, trouble sleeping)     Pt's daughter believes possible infection 5. NEW MEDICINES:  "Have you started on any new medicines recently?" (e.g., opioid pain medicines, benzodiazepines, muscle relaxants, antidepressants, antihistamines, neuroleptics, beta blockers)     Plavix, Baby ASA recently started post MI in February 6. OTHER SYMPTOMS: "Do you have any other symptoms?" (e.g., chest pain, fever, cough, SOB, vomiting, diarrhea, bleeding, other areas of pain)     Fever, cough, increased confusion, decreased ambulation  Protocols used: Weakness (Generalized) and Fatigue-A-AH

## 2023-04-17 ENCOUNTER — Encounter: Payer: Self-pay | Admitting: Internal Medicine

## 2023-04-20 ENCOUNTER — Ambulatory Visit: Payer: Self-pay | Admitting: Nurse Practitioner

## 2023-04-20 ENCOUNTER — Ambulatory Visit: Payer: Medicare PPO | Admitting: Nurse Practitioner

## 2023-04-20 DIAGNOSIS — I252 Old myocardial infarction: Secondary | ICD-10-CM

## 2023-04-20 DIAGNOSIS — D696 Thrombocytopenia, unspecified: Secondary | ICD-10-CM

## 2023-04-20 DIAGNOSIS — E119 Type 2 diabetes mellitus without complications: Secondary | ICD-10-CM

## 2023-04-20 DIAGNOSIS — E1169 Type 2 diabetes mellitus with other specified complication: Secondary | ICD-10-CM

## 2023-04-20 DIAGNOSIS — T458X5A Adverse effect of other primarily systemic and hematological agents, initial encounter: Secondary | ICD-10-CM

## 2023-04-20 DIAGNOSIS — M353 Polymyalgia rheumatica: Secondary | ICD-10-CM

## 2023-04-20 DIAGNOSIS — M810 Age-related osteoporosis without current pathological fracture: Secondary | ICD-10-CM

## 2023-04-20 DIAGNOSIS — I152 Hypertension secondary to endocrine disorders: Secondary | ICD-10-CM

## 2023-04-20 DIAGNOSIS — E063 Autoimmune thyroiditis: Secondary | ICD-10-CM

## 2023-04-20 DIAGNOSIS — M0609 Rheumatoid arthritis without rheumatoid factor, multiple sites: Secondary | ICD-10-CM

## 2023-05-11 ENCOUNTER — Other Ambulatory Visit: Payer: Self-pay | Admitting: Nurse Practitioner

## 2023-05-11 NOTE — Telephone Encounter (Signed)
 Copied from CRM [id number]. Topic: Clinical - Medication Refill >> May 11, 2023  1:11 PM [place] wrote: Most Recent Primary Care Visit:  Provider: Curt Dover I  Department: [facility]  Visit Type: ACUTE  Date: 04/12/2023  Medication: atorvastatin  (LIPITOR ) 80 MG tablet  Has the patient contacted their pharmacy? Yes (Agent: If no, request that the patient contact the pharmacy for the refill. If patient does not wish to contact the pharmacy document the reason why and proceed with request.) (Agent: If yes, when and what did the pharmacy advise?)  Is this the correct pharmacy for this prescription? Yes If no, delete pharmacy and type the correct one.  This is the patient's preferred pharmacy:  [facility] - [place], [place] - [address] [place] [address] Phone: [phone] Fax: [phone]   Has the prescription been filled recently? Yes  Is the patient out of the medication? Yes  Has the patient been seen for an appointment in the last year OR does the patient have an upcoming appointment? Yes  Can we respond through MyChart? Yes  Agent: Please be advised that Rx refills may take up to 3 business days. We ask that you follow-up with your pharmacy.

## 2023-05-14 NOTE — Telephone Encounter (Signed)
 Too soon for refill, last refill 03/18/23 fro 90 and 1 refill.  Requested Prescriptions  Pending Prescriptions Disp Refills   atorvastatin  (LIPITOR ) 80 MG tablet 90 tablet 2    Sig: Take 1 tablet (80 mg total) by mouth daily.     Cardiovascular:  Antilipid - Statins Failed - 05/14/2023  9:44 AM      Failed - Lipid Panel in normal range within the last 12 months    Cholesterol, Total  Date Value Ref Range Status  10/20/2022 108 100 - 199 mg/dL Final   Cholesterol Piccolo, Waived  Date Value Ref Range Status  06/27/2018 121 <200 mg/dL Final    Comment:                            Desirable                <200                         Borderline High      200- 239                         High                     >239    LDL Chol Calc (NIH)  Date Value Ref Range Status  10/20/2022 41 0 - 99 mg/dL Final   HDL  Date Value Ref Range Status  10/20/2022 51 >39 mg/dL Final   Triglycerides  Date Value Ref Range Status  10/20/2022 77 0 - 149 mg/dL Final   Triglycerides Piccolo,Waived  Date Value Ref Range Status  06/27/2018 80 <150 mg/dL Final    Comment:                            Normal                   <150                         Borderline High     150 - 199                         High                200 - 499                         Very High                >499          Passed - Patient is not pregnant      Passed - Valid encounter within last 12 months    Recent Outpatient Visits           1 month ago Sebaceous cyst   [place] [facility] [place], Sanjuan Crumbly T, NP   1 month ago Sebaceous cyst   [place] [facility] [place], [place] T, NP   1 month ago Type 2 diabetes, diet controlled (HCC)   [facility] [place], Sanjuan Crumbly T, NP   2 months ago Sebaceous cyst    [facility] [place], Sanjuan Crumbly T, NP   2 months ago Sebaceous  cyst   [place] [facility] [place], Lavelle Posey, NP

## 2023-05-21 ENCOUNTER — Encounter (HOSPITAL_COMMUNITY): Payer: Self-pay

## 2023-05-23 NOTE — Patient Instructions (Signed)
Be Involved in Caring For Your Health:  Taking Medications When medications are taken as directed, they can greatly improve your health. But if they are not taken as prescribed, they may not work. In some cases, not taking them correctly can be harmful. To help ensure your treatment remains effective and safe, understand your medications and how to take them. Bring your medications to each visit for review by your provider.  Your lab results, notes, and after visit summary will be available on My Chart. We strongly encourage you to use this feature. If lab results are abnormal the clinic will contact you with the appropriate steps. If the clinic does not contact you assume the results are satisfactory. You can always view your results on My Chart. If you have questions regarding your health or results, please contact the clinic during office hours. You can also ask questions on My Chart.  We at [facility] are grateful that you chose [place] to provide your care. We strive to provide evidence-based and compassionate care and are always looking for feedback. If you get a survey from the clinic please complete this so we can hear your opinions.  Diabetes Mellitus and Exercise Regular exercise is important for your health, especially if you have diabetes mellitus. Exercise is not just about losing weight. It can also help you increase muscle strength and bone density and reduce body fat and stress. This can help your level of endurance and make you more fit and flexible. Why should I exercise if I have diabetes? Exercise has many benefits for people with diabetes. It can: Help lower and control your blood sugar (glucose). Help your body respond better and become more sensitive to the hormone insulin. Reduce how much insulin your body needs. Lower your risk for heart disease by: Lowering how much "bad" cholesterol and triglycerides you have in your body. Increasing how much "good" cholesterol  you have in your body. Lowering your blood pressure. Lowering your blood glucose levels. What is my activity plan? Your health care provider or an expert trained in diabetes care (certified diabetes educator) can help you make an activity plan. This plan can help you find the type of exercise that works for you. It may also tell you how often to exercise and for how long. Be sure to: Get at least 150 minutes of medium-intensity or high-intensity exercise each week. This may involve brisk walking, biking, or water aerobics. Do stretching and strengthening exercises at least 2 times a week. This may involve yoga or weight lifting. Spread out your activity over at least 3 days of the week. Get some form of physical activity each day. Do not go more than 2 days in a row without some kind of activity. Avoid being inactive for more than 30 minutes at a time. Take frequent breaks to walk or stretch. Choose activities that you enjoy. Set goals that you know you can accomplish. Start slowly and increase the intensity of your exercise over time. How do I manage my diabetes during exercise?  Monitor your blood glucose Check your blood glucose before and after you exercise. If your blood glucose is 240 mg/dL (40.9 mmol/L) or higher before you exercise, check your urine for ketones. These are chemicals created by the liver. If you have ketones in your urine, do not exercise until your blood glucose returns to normal. If your blood glucose is 100 mg/dL (5.6 mmol/L) or lower, eat a snack that has 15-20 grams of carbohydrate in  it. Check your blood glucose 15 minutes after the snack to make sure that your level is above 100 mg/dL (5.6 mmol/L) before you start to exercise. Your risk for low blood glucose (hypoglycemia) goes up during and after exercise. Know the symptoms of this condition and how to treat it. Follow these instructions at home: Keep a carbohydrate snack on hand for use before, during, and after  exercise. This can help prevent or treat hypoglycemia. Avoid injecting insulin into parts of your body that are going to be used during exercise. This may include: Your arms, when you are going to play tennis. Your legs, when you are about to go jogging. Keep track of your exercise habits. This can help you and your health care provider watch and adjust your activity plan. Write down: What you eat before and after you exercise. Blood glucose levels before and after you exercise. The type and amount of exercise you do. Talk to your health care provider before you start a new activity. They may need to: Make sure that the activity is safe for you. Adjust your insulin, other medicines, and food that you eat. Drink water while you exercise. This can stop you from losing too much water (dehydration). It can also prevent problems caused by having a lot of heat in your body (heat stroke). Where to find more information American Diabetes Association: diabetes.org Association of Diabetes Care & Education Specialists: diabeteseducator.org This information is not intended to replace advice given to you by your health care provider. Make sure you discuss any questions you have with your health care provider. Document Revised: 06/18/2021 Document Reviewed: 06/18/2021 Elsevier Patient Education  2024 [employer].

## 2023-05-28 ENCOUNTER — Ambulatory Visit: Admitting: Nurse Practitioner

## 2023-05-28 ENCOUNTER — Encounter: Payer: Self-pay | Admitting: Nurse Practitioner

## 2023-05-28 VITALS — BP 101/50 | HR 57 | Temp 98.2°F | Wt 96.4 lb

## 2023-05-28 DIAGNOSIS — E1169 Type 2 diabetes mellitus with other specified complication: Secondary | ICD-10-CM

## 2023-05-28 DIAGNOSIS — I252 Old myocardial infarction: Secondary | ICD-10-CM

## 2023-05-28 DIAGNOSIS — M87180 Osteonecrosis due to drugs, jaw: Secondary | ICD-10-CM

## 2023-05-28 DIAGNOSIS — E1159 Type 2 diabetes mellitus with other circulatory complications: Secondary | ICD-10-CM | POA: Diagnosis not present

## 2023-05-28 DIAGNOSIS — T458X5A Adverse effect of other primarily systemic and hematological agents, initial encounter: Secondary | ICD-10-CM

## 2023-05-28 DIAGNOSIS — D696 Thrombocytopenia, unspecified: Secondary | ICD-10-CM

## 2023-05-28 DIAGNOSIS — E063 Autoimmune thyroiditis: Secondary | ICD-10-CM

## 2023-05-28 DIAGNOSIS — M810 Age-related osteoporosis without current pathological fracture: Secondary | ICD-10-CM

## 2023-05-28 DIAGNOSIS — M0609 Rheumatoid arthritis without rheumatoid factor, multiple sites: Secondary | ICD-10-CM | POA: Diagnosis not present

## 2023-05-28 DIAGNOSIS — M353 Polymyalgia rheumatica: Secondary | ICD-10-CM

## 2023-05-28 DIAGNOSIS — I152 Hypertension secondary to endocrine disorders: Secondary | ICD-10-CM

## 2023-05-28 DIAGNOSIS — R001 Bradycardia, unspecified: Secondary | ICD-10-CM

## 2023-05-28 DIAGNOSIS — E119 Type 2 diabetes mellitus without complications: Secondary | ICD-10-CM | POA: Diagnosis not present

## 2023-05-28 DIAGNOSIS — E785 Hyperlipidemia, unspecified: Secondary | ICD-10-CM

## 2023-05-28 LAB — MICROALBUMIN, URINE WAIVED
Creatinine, Urine Waived: 10 mg/dL (ref 10–300)
Microalb, Ur Waived: 10 mg/L (ref 0–19)

## 2023-05-28 LAB — CBC
Hematocrit: 42.5 % (ref 34.0–46.6)
Hemoglobin: 12.5 g/dL (ref 11.1–15.9)
MCH: 28.9 pg (ref 26.6–33.0)
MCHC: 29.4 g/dL — ABNORMAL LOW (ref 31.5–35.7)
MCV: 98 fL — ABNORMAL HIGH (ref 79–97)
Platelets: 160 10*3/uL (ref 150–450)
RBC: 4.32 x10E6/uL (ref 3.77–5.28)
RDW: 16.6 % — ABNORMAL HIGH (ref 11.7–15.4)
WBC: 7.2 10*3/uL (ref 3.4–10.8)

## 2023-05-28 LAB — BAYER DCA HB A1C WAIVED: HB A1C (BAYER DCA - WAIVED): 7 % — ABNORMAL HIGH (ref 4.8–5.6)

## 2023-05-28 NOTE — Assessment & Plan Note (Signed)
Chronic, stable.  No further medications.  Is to return to rheumatology as needed.  At this time is asymptomatic. 

## 2023-05-28 NOTE — Assessment & Plan Note (Signed)
 Chronic, stable, diet-controlled.  A1c 7%, slight trend up.  Urine micro 22 May 2023. Recommend continued focus on diet at this time, discussed with her at length benefit of adding on medication -- however she refuses to start at this time.  Due to advanced age more lenient goal recommended, A1C <8 per geriatric society.  Avoid hypoglycemia.  Initiate medication in future if needed, could consider and SGLT2 for heart health.  - Foot and eye exams up to date. - Statin on board, no ACE/ARB due to lower BP levels at baseline. - Vaccines up to date.

## 2023-05-28 NOTE — Assessment & Plan Note (Signed)
 Stable.  Continue current medication regimen.  Plavix  and ASA for 1 year recommended by cardiology (stop date 03/13/24).  Will continue collaboration with cardiology. She is to notify cardiology or PCP if increase CP or more use of NTG.

## 2023-05-28 NOTE — Assessment & Plan Note (Signed)
Previously monitored by rheumatology with last visit in February 2021, symptoms have improved and she is to return as needed only.   

## 2023-05-28 NOTE — Assessment & Plan Note (Signed)
Chronic, ongoing.  Continue current medication regimen and adjust as needed.  Can consider discontinuation of this if patient wishes to minimize medications in future.  Lipid panel today. 

## 2023-05-28 NOTE — Assessment & Plan Note (Signed)
On 09/23/20 -- Fosamax was stopped and not restarted.  She refuses repeat bone density testing.  Could consider possibly Prolia or IV therapy, would need endo referral for IV.  She refuses treatment at this time.

## 2023-05-28 NOTE — Assessment & Plan Note (Signed)
 Chronic, ongoing.  Fosamax  stopped > 3 years ago for holiday and will continue off due to jaw and teeth issues with treatment.  She refuses repeat bone density testing.  Works on chair exercises and being active at home + taking Vitamin D  and calcium  supplements.

## 2023-05-28 NOTE — Assessment & Plan Note (Signed)
 Stable, well below goal on lower side. BB was stopped in past month due to bradycardia.  Concern for fall risk.  Recommend she wear compression hose at home, on during day and off at night.  Educated patient and family on this.  Recommend she monitor BP at least a few mornings a week at home and document.  LABS: CBC and BMP.

## 2023-05-28 NOTE — Assessment & Plan Note (Signed)
 Chronic, ongoing.  Continue current medication regimen and adjust as needed.  TSH and Free T4 today.  She continues on hyper side, but she does not like medication changes and refuses to change dosing.  Has tried changes in past and had similar side effects.  Wishes to maintain 75 MCG dosing, which we will at this time and she will alert provider if any issues -- such as heart palpitations or increased anxiety. We have had multiple discussions on need to change medication dosing.

## 2023-05-28 NOTE — Assessment & Plan Note (Signed)
 At baseline and BB was discontinued.  Recommend compression hose on during day and off at night.  Ensure good fluid intake at home.  Concern for falls with current symptoms, discussed with patient.

## 2023-05-28 NOTE — Progress Notes (Signed)
 BP (!) 101/50   Pulse (!) 57   Temp 98.2 F (36.8 C) (Oral)   Wt 96 lb 6.4 oz (43.7 kg)   LMP  (LMP Unknown)   SpO2 97%   BMI 20.15 kg/m    Subjective:    Patient ID: Kirsten Riley, female    DOB: 1931/10/17, 89 y.o.   MRN: [id number]  HPI: Kirsten Riley is a 89 y.o. female  Chief Complaint  Patient presents with   Hypertension   Hyperlipidemia   Diabetes   Hypothyroidism   DIABETES A1c in February was 6.9%.  No current medications, diet controlled.  Prefers to not take medication unless absolutely necessary.  Keeps diet strict at home. Hypoglycemic episodes:no Polydipsia/polyuria: no Visual disturbance: no Chest pain: no Paresthesias: no Glucose Monitoring: no             Accucheck frequency: Not Checking             Fasting glucose:             Post prandial:             Evening:             Before meals: Taking Insulin ?: no             Long acting insulin :             Short acting insulin : Blood Pressure Monitoring: not checking Retinal Examination: Up to Date -- Dr. Godwin Lat Exam: Up to Date Pneumovax: Up to date Influenza: Up to Date Aspirin : no    HYPOTHYROIDISM Taking Levothyroxine  75 MCG daily.  Last TSH was 0.032, we have discussed ongoing low levels (hyper) multiple times.  Tried changing dose once, but if made her feel bad, which is similar to what happened in past with previous PCP, gets too tired and heart does no beat right. She reports they tried changing in past too and it did not work.  She absolutely refuses to change dosing on this. Satisfied with current treatment? yes Medication side effects: no Medication compliance: good compliance Etiology of hypothyroidism:  Recent dose adjustment: none Fatigue: no  Cold intolerance: yes Heat intolerance: no Weight gain: no Weight loss: no Constipation: no Diarrhea/loose stools: no Palpitations: no Lower extremity edema: no Anxiety/depressed mood: no    HYPERLIPIDEMIA Taking Atorvastatin   80 MG daily, ASA, Plavix , + Omega 3. History of STEMI 03/12/23. She did not tolerate BB due to lower resting HR.  Has had two episodes of chest discomfort, took NTG both times and this helped.  Thinks she was doing too much hard work outside. Saw cardiology last 03/25/23. Hyperlipidemia status: good compliance Satisfied with current treatment?  no Side effects:  no Medication compliance: good compliance Supplements: none Aspirin :  no The ASCVD Risk score (Arnett DK, et al., 2019) failed to calculate for the following reasons:   The 2019 ASCVD risk score is only valid for ages 25 to 57   Risk score cannot be calculated because patient has a medical history suggesting prior/existing ASCVD   OSTEOPOROSIS & POLYMYALGIA RHEUMATICA: Last DEXA 03/06/2019 with T score -3.1.  She wants no further imaging done. Followed by Dr. Kernodle at [facility] in past for RA and osteoporosis, last visit in February 2021, needs to return only on as needed basis. Fosamax  discontinued on 09/23/20 due to jaw and dentition issues.  Is taking Vitamin D  and calcium  daily. Had visit with specialist at [facility] for jaw issues in past. Does  strength exercises at home for fall prevention, very active at baseline. No recent falls or fractures. Does not want to have any other treatments for osteoporosis.  Satisfied with current treatment?: yes - none, she wishes none Medication side effects: yes - jaw and dental issues presented Past osteoporosis medications/treatments: Fosamax  Adequate calcium  & vitamin D : yes Intolerance to bisphosphonates:yes Weight bearing exercises: yes      05/28/2023   10:54 AM 03/05/2023    2:58 PM 12/03/2022    1:54 PM 11/26/2022   11:42 AM 10/20/2022    8:57 AM  Depression screen PHQ 2/9  Decreased Interest 0 0 1 0 0  Down, Depressed, Hopeless 0 0 0 0 0  PHQ - 2 Score 0 0 1 0 0  Altered sleeping 0 0 0 1 0  Tired, decreased energy 0 0 1 1 0  Change in appetite 0 0 0 0 0  Feeling bad or failure about yourself   0 0 0 0 0  Trouble concentrating 0 0 1 0 0  Moving slowly or fidgety/restless 0 0 1 0 0  Suicidal thoughts 0 0 0 0 0  PHQ-9 Score 0 0 4 2 0  Difficult doing work/chores Not difficult at all  Not difficult at all Somewhat difficult Not difficult at all       05/28/2023   10:55 AM 12/03/2022    1:54 PM 11/26/2022   11:42 AM 10/20/2022    8:58 AM  GAD 7 : Generalized Anxiety Score  Nervous, Anxious, on Edge 0 0 0 0  Control/stop worrying 0 0 0 0  Worry too much - different things 0 1 0 0  Trouble relaxing 0 1 1 0  Restless 0 1 0 0  Easily annoyed or irritable 0 0 0 0  Afraid - awful might happen 0 0 0 0  Total GAD 7 Score 0 3 1 0  Anxiety Difficulty Not difficult at all Not difficult at all Somewhat difficult Not difficult at all   Relevant past medical, surgical, family and social history reviewed and updated as indicated. Interim medical history since our last visit reviewed. Allergies and medications reviewed and updated.  Review of Systems  Constitutional:  Negative for activity change, appetite change, diaphoresis, fatigue and fever.  Respiratory:  Negative for cough, chest tightness and shortness of breath.   Cardiovascular:  Negative for chest pain, palpitations and leg swelling.  Gastrointestinal: Negative.   Endocrine: Negative.   Neurological: Negative.   Psychiatric/Behavioral: Negative.    All other systems reviewed and are negative.  Per HPI unless specifically indicated above     Objective:    BP (!) 101/50   Pulse (!) 57   Temp 98.2 F (36.8 C) (Oral)   Wt 96 lb 6.4 oz (43.7 kg)   LMP  (LMP Unknown)   SpO2 97%   BMI 20.15 kg/m   Wt Readings from Last 3 Encounters:  05/28/23 96 lb 6.4 oz (43.7 kg)  04/12/23 102 lb (46.3 kg)  04/01/23 99 lb 12.8 oz (45.3 kg)    Physical Exam Vitals and nursing note reviewed.  Constitutional:      General: She is awake. She is not in acute distress.    Appearance: She is well-developed. She is not ill-appearing.   HENT:     Head: Normocephalic.     Right Ear: Hearing normal.     Left Ear: Hearing normal.  Eyes:     General: Lids are normal.  Right eye: No discharge.        Left eye: No discharge.     Conjunctiva/sclera: Conjunctivae normal.     Pupils: Pupils are equal, round, and reactive to light.  Neck:     Thyroid : No thyromegaly.     Vascular: No carotid bruit.  Cardiovascular:     Rate and Rhythm: Regular rhythm. Bradycardia present.     Heart sounds: Normal heart sounds. No murmur heard.    No gallop.  Pulmonary:     Effort: Pulmonary effort is normal. No accessory muscle usage or respiratory distress.     Breath sounds: Normal breath sounds.  Abdominal:     General: Bowel sounds are normal.     Palpations: Abdomen is soft.  Musculoskeletal:     Cervical back: Normal range of motion and neck supple.     Right lower leg: No edema.     Left lower leg: No edema.  Skin:    General: Skin is warm and dry.  Neurological:     Mental Status: She is alert and oriented to person, place, and time.  Psychiatric:        Attention and Perception: Attention normal.        Mood and Affect: Mood normal.        Behavior: Behavior normal. Behavior is cooperative.        Thought Content: Thought content normal.        Judgment: Judgment normal.    Results for orders placed or performed in visit on 05/28/23  Bayer DCA Hb A1c Waived   Collection Time: 05/28/23 10:54 AM  Result Value Ref Range   HB A1C (BAYER DCA - WAIVED) 7.0 (H) 4.8 - 5.6 %  Microalbumin, Urine Waived   Collection Time: 05/28/23 10:54 AM  Result Value Ref Range   Microalb, Ur Waived 10 0 - 19 mg/L   Creatinine, Urine Waived 10 10 - 300 mg/dL   Microalb/Creat Ratio 30-300 (H) <30 mg/g      Assessment & Plan:   Problem List Items Addressed This Visit       Cardiovascular and Mediastinum   Hypertension associated with diabetes (HCC)   Stable, well below goal on lower side. BB was stopped in past month due to  bradycardia.  Concern for fall risk.  Recommend she wear compression hose at home, on during day and off at night.  Educated patient and family on this.  Recommend she monitor BP at least a few mornings a week at home and document.  LABS: CBC and BMP.       Relevant Orders   Basic metabolic panel with GFR   Bayer DCA Hb A1c Waived (Completed)   Microalbumin, Urine Waived (Completed)   CBC (STAT)     Endocrine   Type 2 diabetes, diet controlled (HCC) - Primary   Chronic, stable, diet-controlled.  A1c 7%, slight trend up.  Urine micro 22 May 2023. Recommend continued focus on diet at this time, discussed with her at length benefit of adding on medication -- however she refuses to start at this time.  Due to advanced age more lenient goal recommended, A1C <8 per geriatric society.  Avoid hypoglycemia.  Initiate medication in future if needed, could consider and SGLT2 for heart health.  - Foot and eye exams up to date. - Statin on board, no ACE/ARB due to lower BP levels at baseline. - Vaccines up to date.      Relevant Orders   Basic metabolic  panel with GFR   Bayer DCA Hb A1c Waived (Completed)   Microalbumin, Urine Waived (Completed)   Hyperlipidemia associated with type 2 diabetes mellitus (HCC)   Chronic, ongoing.  Continue current medication regimen and adjust as needed.  Can consider discontinuation of this if patient wishes to minimize medications in future.  Lipid panel today.      Relevant Orders   Bayer DCA Hb A1c Waived (Completed)   Lipid Panel w/o Chol/HDL Ratio   Hashimoto's thyroiditis   Chronic, ongoing.  Continue current medication regimen and adjust as needed.  TSH and Free T4 today.  She continues on hyper side, but she does not like medication changes and refuses to change dosing.  Has tried changes in past and had similar side effects.  Wishes to maintain 75 MCG dosing, which we will at this time and she will alert provider if any issues -- such as heart palpitations  or increased anxiety. We have had multiple discussions on need to change medication dosing.        Musculoskeletal and Integument   Rheumatoid arthritis of multiple sites without rheumatoid factor (HCC)   Chronic, stable.  No further medications.  Is to return to rheumatology as needed.  At this time is asymptomatic.      Relevant Orders   TSH   T4, free   Osteoporosis   Chronic, ongoing.  Fosamax  stopped > 3 years ago for holiday and will continue off due to jaw and teeth issues with treatment.  She refuses repeat bone density testing.  Works on chair exercises and being active at home + taking Vitamin D  and calcium  supplements.        Other   PMR (polymyalgia rheumatica) (HCC)   Previously monitored by rheumatology with last visit in February 2021, symptoms have improved and she is to return as needed only.        History of ST elevation myocardial infarction (STEMI)   Stable.  Continue current medication regimen.  Plavix  and ASA for 1 year recommended by cardiology (stop date 03/13/24).  Will continue collaboration with cardiology. She is to notify cardiology or PCP if increase CP or more use of NTG.      Bradycardia   At baseline and BB was discontinued.  Recommend compression hose on during day and off at night.  Ensure good fluid intake at home.  Concern for falls with current symptoms, discussed with patient.      Bisphosphonate-related jaw necrosis (HCC)   On 09/23/20 -- Fosamax  was stopped and not restarted.  She refuses repeat bone density testing.  Could consider possibly Prolia or IV therapy, would need endo referral for IV.  She refuses treatment at this time.        Follow up plan: Return in about 6 months (around 11/28/2023) for T2DM, HTN/HLD, Hypothyroid, Osteoporosis.

## 2023-05-29 ENCOUNTER — Ambulatory Visit: Payer: Self-pay | Admitting: Nurse Practitioner

## 2023-05-29 LAB — BASIC METABOLIC PANEL WITH GFR
BUN/Creatinine Ratio: 14 (ref 12–28)
BUN: 10 mg/dL (ref 8–27)
CO2: 22 mmol/L (ref 20–29)
Calcium: 9.2 mg/dL (ref 8.7–10.3)
Chloride: 99 mmol/L (ref 96–106)
Creatinine, Ser: 0.71 mg/dL (ref 0.57–1.00)
Glucose: 120 mg/dL — ABNORMAL HIGH (ref 70–99)
Potassium: 4.5 mmol/L (ref 3.5–5.2)
Sodium: 136 mmol/L (ref 134–144)
eGFR: 81 mL/min/{1.73_m2} (ref >59)

## 2023-05-29 LAB — T4, FREE: Free T4: 1.63 ng/dL (ref 0.82–1.77)

## 2023-05-29 LAB — LIPID PANEL W/O CHOL/HDL RATIO
Cholesterol, Total: 88 mg/dL — ABNORMAL LOW (ref 100–199)
HDL: 30 mg/dL — ABNORMAL LOW (ref >39)
LDL Chol Calc (NIH): 41 mg/dL (ref 0–99)
Triglycerides: 80 mg/dL (ref 0–149)
VLDL Cholesterol Cal: 17 mg/dL (ref 5–40)

## 2023-05-29 LAB — TSH: TSH: 0.442 u[IU]/mL — ABNORMAL LOW (ref 0.450–4.500)

## 2023-05-29 NOTE — Progress Notes (Signed)
 Contacted via MyChart   Good morning Kirsten Riley, your labs have returned and are overall stable with no medication changes needed.  Thyroid  labs are still a little low, but better than on past checks.  Any questions? Keep being amazing!!  Thank you for allowing me to participate in your care.  I appreciate you. Kindest regards, Yalda Herd

## 2023-05-31 LAB — HM DIABETES EYE EXAM

## 2023-07-13 ENCOUNTER — Ambulatory Visit: Admitting: Emergency Medicine

## 2023-07-13 VITALS — Ht 59.0 in | Wt 95.0 lb

## 2023-07-13 DIAGNOSIS — Z0000 Encounter for general adult medical examination without abnormal findings: Secondary | ICD-10-CM | POA: Diagnosis not present

## 2023-07-13 NOTE — Progress Notes (Signed)
 Subjective:   Kirsten Riley is a 89 y.o. who presents for a Medicare Wellness preventive visit.  As a reminder, Annual Wellness Visits don't include a physical exam, and some assessments may be limited, especially if this visit is performed virtually. We may recommend an in-person follow-up visit with your provider if needed.  Visit Complete: Virtual I connected with  Kirsten Riley on 07/13/23 by a audio enabled telemedicine application and verified that I am speaking with the correct person using two identifiers.  Patient Location: Home  Provider Location: Office/Clinic  I discussed the limitations of evaluation and management by telemedicine. The patient expressed understanding and agreed to proceed.  Vital Signs: Because this visit was a virtual/telehealth visit, some criteria may be missing or patient reported. Any vitals not documented were not able to be obtained and vitals that have been documented are patient reported.  VideoDeclined- This patient declined Librarian, academic. Therefore the visit was completed with audio only.  Persons Participating in Visit: Patient.  AWV Questionnaire: No: Patient Medicare AWV questionnaire was not completed prior to this visit.  Cardiac Risk Factors include: advanced age (>71men, >46 women);diabetes mellitus;hypertension;dyslipidemia     Objective:    Today's Vitals   07/13/23 1305  Weight: 95 lb (43.1 kg)  Height: 4' 11 (1.499 m)   Body mass index is 19.19 kg/m.     07/13/2023    1:19 PM 03/12/2023   10:00 PM 11/27/2022    9:00 AM 11/26/2022    7:09 PM 07/07/2022    3:06 PM 06/16/2021   10:34 AM 06/07/2020   10:33 AM  Advanced Directives  Does Patient Have a Medical Advance Directive? Yes Yes No No No No Yes  Type of Estate agent of [place];Living will Living will     Out of facility DNR (pink MOST or yellow form)  Does patient want to make changes to medical advance directive? No -  Patient declined No - Patient declined       Copy of Healthcare Power of Attorney in Chart? No - copy requested        Would patient like information on creating a medical advance directive?   No - Patient declined  No - Patient declined No - Patient declined     Current Medications (verified) Outpatient Encounter Medications as of 07/13/2023  Medication Sig   aspirin  EC 81 MG tablet Take 1 tablet (81 mg total) by mouth daily. Swallow whole.   atorvastatin  (LIPITOR ) 80 MG tablet Take 1 tablet (80 mg total) by mouth daily.   Blood Glucose Monitoring Suppl (ONETOUCH VERIO) w/Device KIT To check blood sugar twice a day with goal fasting in morning <130 and goal 2 hours after meal <180.  Write all checks down for provider visits.   cholecalciferol  (VITAMIN D3) 25 MCG (1000 UNIT) tablet Take 2,000 Units by mouth daily. Take once a day   clopidogrel  (PLAVIX ) 75 MG tablet Take 1 tablet (75 mg total) by mouth daily with breakfast.   glucose blood test strip To check blood sugar twice a day with goal fasting in morning <130 and goal 2 hours after meal <180.  Write all checks down for provider visits.   Lancets (ONETOUCH ULTRASOFT) lancets Use as instructed   levothyroxine  (SYNTHROID ) 75 MCG tablet Take 1 tablet (75 mcg total) by mouth daily before breakfast.   nitroGLYCERIN  (NITROSTAT ) 0.4 MG SL tablet Place 1 tablet (0.4 mg total) under the tongue every 5 (five) minutes  x 3 doses as needed for up to 10 doses for chest pain.   Omega-3 Fatty Acids (FISH OIL) 1000 MG CAPS Take 1 capsule by mouth daily.   No facility-administered encounter medications on file as of 07/13/2023.    Allergies (verified) Sulfa antibiotics and Penicillins   History: Past Medical History:  Diagnosis Date   CAD (coronary artery disease)    Diabetes mellitus (HCC) 12/10/2014   Fractured rib    Hyperlipidemia    Hypertension    Hypothyroidism    Polymyalgia rheumatica (HCC) 06/08/2014   Past Surgical History:  Procedure  Laterality Date   ABDOMINAL HYSTERECTOMY  1978   partial   CORONARY ARTERY BYPASS GRAFT     CORONARY/GRAFT ACUTE MI REVASCULARIZATION N/A 03/12/2023   Procedure: Coronary/Graft Acute MI Revascularization;  Surgeon: Ammon Blunt, MD;  Location: [facility] INVASIVE CV LAB;  Service: Cardiovascular;  Laterality: N/A;   hypercholesterol     hypothyroid     LEFT HEART CATH AND CORONARY ANGIOGRAPHY N/A 03/12/2023   Procedure: LEFT HEART CATH AND CORONARY ANGIOGRAPHY;  Surgeon: Ammon Blunt, MD;  Location: [facility] INVASIVE CV LAB;  Service: Cardiovascular;  Laterality: N/A;   Family History  Problem Relation Age of Onset   Heart disease Mother    Stroke Mother    Hypertension Mother    Cancer Father    Cancer Sister        breast, ovarian, pancreatic   Heart disease Sister        x2   Cancer Brother    Heart disease Brother    Cancer Brother    Heart disease Brother    Cancer Brother    Heart disease Brother    Social History   Socioeconomic History   Marital status: Widowed    Spouse name: Not on file   Number of children: 4   Years of education: Not on file   Highest education level: 12th grade  Occupational History   Occupation: retired  Tobacco Use   Smoking status: Never   Smokeless tobacco: Never  Vaping Use   Vaping status: Never Used  Substance and Sexual Activity   Alcohol use: No    Alcohol/week: 0.0 standard drinks of alcohol   Drug use: No   Sexual activity: Never  Other Topics Concern   Not on file  Social History Narrative   3 living children, 1 deceased   Social Drivers of Corporate investment banker Strain: Low Risk  (07/13/2023)   Overall Financial Resource Strain (CARDIA)    Difficulty of Paying Living Expenses: Not hard at all  Food Insecurity: No Food Insecurity (07/13/2023)   Hunger Vital Sign    Worried About Running Out of Food in the Last Year: Never true    Ran Out of Food in the Last Year: Never true  Transportation Needs: No  Transportation Needs (07/13/2023)   PRAPARE - Administrator, Civil Service (Medical): No    Lack of Transportation (Non-Medical): No  Physical Activity: Inactive (07/13/2023)   Exercise Vital Sign    Days of Exercise per Week: 0 days    Minutes of Exercise per Session: 0 min  Stress: No Stress Concern Present (07/13/2023)   [employer] of Occupational Health - Occupational Stress Questionnaire    Feeling of Stress: Not at all  Social Connections: Moderately Isolated (07/13/2023)   Social Connection and Isolation Panel    Frequency of Communication with Friends and Family: More than three times a week  Frequency of Social Gatherings with Friends and Family: Three times a week    Attends Religious Services: More than 4 times per year    Active Member of Clubs or Organizations: No    Attends Banker Meetings: Never    Marital Status: Widowed    Tobacco Counseling Counseling given: Not Answered    Clinical Intake:  Pre-visit preparation completed: Yes  Pain : No/denies pain     BMI - recorded: 19.19 Nutritional Status: BMI of 19-24  Normal Nutritional Risks: None Diabetes: Yes CBG done?: No Did pt. bring in CBG monitor from home?: No  Lab Results  Component Value Date   HGBA1C 7.0 (H) 05/28/2023   HGBA1C 6.9 (H) 03/12/2023   HGBA1C 6.8 (H) 04/20/2022     How often do you need to have someone help you when you read instructions, pamphlets, or other written materials from your doctor or pharmacy?: 1 - Never  Interpreter Needed?: No  Information entered by :: Vina Ned, CMA   Activities of Daily Living     07/13/2023    1:07 PM 03/12/2023   10:00 PM  In your present state of health, do you have any difficulty performing the following activities:  Hearing? 0 0  Vision? 0 0  Difficulty concentrating or making decisions? 0 0  Walking or climbing stairs? 1   Comment someone has to help climb stairs   Dressing or bathing? 0   Doing  errands, shopping? 1 0  Comment doesn't drive, children takes to appointments   Preparing Food and eating ? N   Using the Toilet? N   In the past six months, have you accidently leaked urine? Y   Comment wears depends   Do you have problems with loss of bowel control? Y   Comment wears depends   Managing your Medications? N   Managing your Finances? Y   Comment daughter and son help with managing finances   Housekeeping or managing your Housekeeping? N     Patient Care Team: Cannady, Jolene T, NP as PCP - General (Nurse Practitioner) Mittie Gaskin, MD as Referring Physician (Ophthalmology) Ammon Blunt, MD as Consulting Physician (Cardiology)  I have updated your Care Teams any recent Medical Services you may have received from other providers in the past year.     Assessment:   This is a routine wellness examination for Kirsten Riley.  Hearing/Vision screen Hearing Screening - Comments:: Denies hearing loss Vision Screening - Comments:: Gets DM eye exams, Dr. Mittie, [facility] [place]   Goals Addressed             This Visit's Progress    Patient Stated       Would like to be able to mow her grass again       Depression Screen     07/13/2023    1:16 PM 05/28/2023   10:54 AM 03/05/2023    2:58 PM 12/03/2022    1:54 PM 11/26/2022   11:42 AM 10/20/2022    8:57 AM 07/07/2022    3:05 PM  PHQ 2/9 Scores  PHQ - 2 Score 0 0 0 1 0 0 0  PHQ- 9 Score 0 0 0 4 2 0 0    Fall Risk     07/13/2023    1:21 PM 03/05/2023    2:58 PM 10/20/2022    8:57 AM 07/07/2022    3:07 PM 06/18/2022   11:33 AM  Fall Risk   Falls in the past  year? 0 0 0 0 0  Number falls in past yr: 0 0 0 0 0  Injury with Fall? 0 0 0 0 0  Risk for fall due to : No Fall Risks No Fall Risks No Fall Risks No Fall Risks No Fall Risks  Follow up Falls evaluation completed Falls evaluation completed Falls evaluation completed Falls prevention discussed;Falls evaluation completed Falls evaluation  completed    MEDICARE RISK AT HOME:  Medicare Risk at Home Any stairs in or around the home?: Yes If so, are there any without handrails?: No Home free of loose throw rugs in walkways, pet beds, electrical cords, etc?: Yes Adequate lighting in your home to reduce risk of falls?: Yes Life alert?: No Use of a cane, walker or w/c?: No Grab bars in the bathroom?: Yes Shower chair or bench in shower?: Yes Elevated toilet seat or a handicapped toilet?: No  TIMED UP AND GO:  Was the test performed?  No  Cognitive Function: 6CIT completed        07/13/2023    1:22 PM 11/26/2022   11:59 AM 07/07/2022    3:08 PM 06/16/2021   10:35 AM 06/07/2020   10:44 AM  6CIT Screen  What Year? 0 points 0 points 0 points 0 points 0 points  What month? 0 points 0 points 0 points 0 points 0 points  What time? 0 points 3 points 0 points 0 points 0 points  Count back from 20 0 points 0 points 0 points 0 points 0 points  Months in reverse 4 points 2 points 4 points 0 points 0 points  Repeat phrase 8 points 6 points 4 points 6 points 2 points  Total Score 12 points 11 points 8 points 6 points 2 points    Immunizations Immunization History  Administered Date(s) Administered   Fluad Quad(high Dose 65+) 10/10/2018, 10/16/2019, 10/15/2020, 10/15/2021   Fluad Trivalent(High Dose 65+) 10/20/2022   Influenza, High Dose Seasonal PF 10/18/2015, 10/16/2016, 10/19/2017   Influenza,inj,Quad PF,6+ Mos 10/09/2014   Moderna Sars-Covid-2 Vaccination 01/09/2020   Pneumococcal Conjugate-13 10/09/2013   Pneumococcal Polysaccharide-23 11/08/2005   Td 09/22/2005   Tdap 10/18/2015   Unspecified SARS-COV-2 Vaccination 02/13/2019, 03/13/2019   Zoster Recombinant(Shingrix) 03/27/2021, 06/02/2021   Zoster, Live 09/22/2005    Screening Tests Health Maintenance  Topic Date Due   DEXA SCAN  03/04/2021   COVID-19 Vaccine (4 - 2024-25 season) 09/13/2022   FOOT EXAM  06/18/2023   INFLUENZA VACCINE  08/13/2023   HEMOGLOBIN  A1C  11/28/2023   OPHTHALMOLOGY EXAM  05/30/2024   Medicare Annual Wellness (AWV)  07/12/2024   DTaP/Tdap/Td (3 - Td or Tdap) 10/17/2025   Pneumococcal Vaccine: 50+ Years  Completed   Zoster Vaccines- Shingrix  Completed   Hepatitis B Vaccines  Aged Out   HPV VACCINES  Aged Out   Meningococcal B Vaccine  Aged Out    Health Maintenance  Health Maintenance Due  Topic Date Due   DEXA SCAN  03/04/2021   COVID-19 Vaccine (4 - 2024-25 season) 09/13/2022   FOOT EXAM  06/18/2023   Health Maintenance Items Addressed: See Nurse Notes at the end of this note  Additional Screening:  Vision Screening: Recommended annual ophthalmology exams for early detection of glaucoma and other disorders of the eye. Would you like a referral to an eye doctor? No    Dental Screening: Recommended annual dental exams for proper oral hygiene  Community Resource Referral / Chronic Care Management: CRR required this visit?  No  CCM required this visit?  No   Plan:    I have personally reviewed and noted the following in the patient's chart:   Medical and social history Use of alcohol, tobacco or illicit drugs  Current medications and supplements including opioid prescriptions. Patient is not currently taking opioid prescriptions. Functional ability and status Nutritional status Physical activity Advanced directives List of other physicians Hospitalizations, surgeries, and ER visits in previous 12 months Vitals Screenings to include cognitive, depression, and falls Referrals and appointments  In addition, I have reviewed and discussed with patient certain preventive protocols, quality metrics, and best practice recommendations. A written personalized care plan for preventive services as well as general preventive health recommendations were provided to patient.   Vina Ned, CMA   07/13/2023   After Visit Summary: (MyChart) Due to this being a telephonic visit, the after visit summary with  patients personalized plan was offered to patient via MyChart   Notes:  6 CIT - 12 Needs DM foot exam at next OV on 11/29/23 Declined DEXA scan Screening colonoscopy and MMG no longer recommended due to age Declined Covid vaccine

## 2023-07-13 NOTE — Patient Instructions (Signed)
 Kirsten Riley , Thank you for taking time out of your busy schedule to complete your Annual Wellness Visit with me. I enjoyed our conversation and look forward to speaking with you again next year. I, as well as your care team,  appreciate your ongoing commitment to your health goals. Please review the following plan we discussed and let me know if I can assist you in the future. Your Game plan/ To Do List    Referrals: None  Follow up Visits: Next Medicare AWV with our clinical staff: 07/18/24 @ 1:10pm (PHONE VISIT)   Have you seen your provider in the last 6 months (3 months if uncontrolled diabetes)? Yes Next Office Visit with your provider: 11/29/23 @ 10:20am with Jolene Cannady, NP  Clinician Recommendations:  Aim for 30 minutes of exercise or brisk walking, 6-8 glasses of water, and 5 servings of fruits and vegetables each day.       This is a list of the screening recommended for you and due dates:  Health Maintenance  Topic Date Due   DEXA scan (bone density measurement)  03/04/2021   COVID-19 Vaccine (4 - 2024-25 season) 09/13/2022   Complete foot exam   06/18/2023   Flu Shot  08/13/2023   Hemoglobin A1C  11/28/2023   Eye exam for diabetics  05/30/2024   Medicare Annual Wellness Visit  07/12/2024   DTaP/Tdap/Td vaccine (3 - Td or Tdap) 10/17/2025   Pneumococcal Vaccine for age over 26  Completed   Zoster (Shingles) Vaccine  Completed   Hepatitis B Vaccine  Aged Out   HPV Vaccine  Aged Out   Meningitis B Vaccine  Aged Out    Advanced directives: (Copy Requested) Please bring a copy of your health care power of attorney and living will to the office to be added to your chart at your convenience. You can mail to [facility] [address] [place], [place] [address] or email to [email or web address] Advance Care Planning is important because it:  [x]  Makes sure you receive the medical care that is consistent with your values, goals, and preferences  [x]  It  provides guidance to your family and loved ones and reduces their decisional burden about whether or not they are making the right decisions based on your wishes.  Follow the link provided in your after visit summary or read over the paperwork we have mailed to you to help you started getting your Advance Directives in place. If you need assistance in completing these, please reach out to us  so that we can help you!  See attachments for Preventive Care and Fall Prevention Tips.   Fall Prevention in the Home, Adult Falls can cause injuries and affect people of all ages. There are many simple things that you can do to make your home safe and to help prevent falls. If you need it, ask for help making these changes. What actions can I take to prevent falls? General information Use good lighting in all rooms. Make sure to: Replace any light bulbs that burn out. Turn on lights if it is dark and use night-lights. Keep items that you use often in easy-to-reach places. Lower the shelves around your home if needed. Move furniture so that there are clear paths around it. Do not keep throw rugs or other things on the floor that can make you trip. If any of your floors are uneven, fix them. Add color or contrast paint or tape to clearly mark and help you see:  Grab bars or handrails. First and last steps of staircases. Where the edge of each step is. If you use a ladder or stepladder: Make sure that it is fully opened. Do not climb a closed ladder. Make sure the sides of the ladder are locked in place. Have someone hold the ladder while you use it. Know where your pets are as you move through your home. What can I do in the bathroom?     Keep the floor dry. Clean up any water that is on the floor right away. Remove soap buildup in the bathtub or shower. Buildup makes bathtubs and showers slippery. Use non-skid mats or decals on the floor of the bathtub or shower. Attach bath mats securely with  double-sided, non-slip rug tape. If you need to sit down while you are in the shower, use a non-slip stool. Install grab bars by the toilet and in the bathtub and shower. Do not use towel bars as grab bars. What can I do in the bedroom? Make sure that you have a light by your bed that is easy to reach. Do not use any sheets or blankets on your bed that hang to the floor. Have a firm bench or chair with side arms that you can use for support when you get dressed. What can I do in the kitchen? Clean up any spills right away. If you need to reach something above you, use a sturdy step stool that has a grab bar. Keep electrical cables out of the way. Do not use floor polish or wax that makes floors slippery. What can I do with my stairs? Do not leave anything on the stairs. Make sure that you have a light switch at the top and the bottom of the stairs. Have them installed if you do not have them. Make sure that there are handrails on both sides of the stairs. Fix handrails that are broken or loose. Make sure that handrails are as long as the staircases. Install non-slip stair treads on all stairs in your home if they do not have carpet. Avoid having throw rugs at the top or bottom of stairs, or secure the rugs with carpet tape to prevent them from moving. Choose a carpet design that does not hide the edge of steps on the stairs. Make sure that carpet is firmly attached to the stairs. Fix any carpet that is loose or worn. What can I do on the outside of my home? Use bright outdoor lighting. Repair the edges of walkways and driveways and fix any cracks. Clear paths of anything that can make you trip, such as tools or rocks. Add color or contrast paint or tape to clearly mark and help you see high doorway thresholds. Trim any bushes or trees on the main path into your home. Check that handrails are securely fastened and in good repair. Both sides of all steps should have handrails. Install  guardrails along the edges of any raised decks or porches. Have leaves, snow, and ice cleared regularly. Use sand, salt, or ice melt on walkways during winter months if you live where there is ice and snow. In the garage, clean up any spills right away, including grease or oil spills. What other actions can I take? Review your medicines with your health care provider. Some medicines can make you confused or feel dizzy. This can increase your chance of falling. Wear closed-toe shoes that fit well and support your feet. Wear shoes that have rubber soles and low  heels. Use a cane, walker, scooter, or crutches that help you move around if needed. Talk with your provider about other ways that you can decrease your risk of falls. This may include seeing a physical therapist to learn to do exercises to improve movement and strength. Where to find more information Centers for Disease Control and Prevention, STEADI: TonerPromos.no [employer] on Aging: BaseRingTones.pl National Institute on Aging: BaseRingTones.pl Contact a health care provider if: You are afraid of falling at home. You feel weak, drowsy, or dizzy at home. You fall at home. Get help right away if you: Lose consciousness or have trouble moving after a fall. Have a fall that causes a head injury. These symptoms may be an emergency. Get help right away. Call 911. Do not wait to see if the symptoms will go away. Do not drive yourself to the hospital. This information is not intended to replace advice given to you by your health care provider. Make sure you discuss any questions you have with your health care provider. Document Revised: 09/01/2021 Document Reviewed: 09/01/2021 Elsevier Patient Education  2024 [employer].

## 2023-08-30 ENCOUNTER — Other Ambulatory Visit: Payer: Self-pay | Admitting: Nurse Practitioner

## 2023-09-01 NOTE — Telephone Encounter (Signed)
 Requested medication (s) are due for refill today: yes  Requested medication (s) are on the active medication list: yes  Last refill:  06/18/22 #90 4 RF  Future visit scheduled: yes  Notes to clinic:  TSH 0.442   Requested Prescriptions  Pending Prescriptions Disp Refills   levothyroxine  (SYNTHROID ) 75 MCG tablet [Pharmacy Med Name: LEVOTHYROXINE  SODIUM 75MCG TABLET] 90 tablet 4    Sig: TAKE ONE TABLET (75 MCG TOTAL) BY MOUTH DAILY BEFORE BREAKFAST.     Endocrinology:  Hypothyroid Agents Failed - 09/01/2023  1:21 PM      Failed - TSH in normal range and within 360 days    TSH  Date Value Ref Range Status  05/28/2023 0.442 (L) 0.450 - 4.500 uIU/mL Final         Passed - Valid encounter within last 12 months    Recent Outpatient Visits           3 months ago Type 2 diabetes, diet controlled (HCC)   [place] [facility] [place], [place] T, NP   5 months ago Sebaceous cyst   [place] [facility] [place], Melanie T, NP   5 months ago Sebaceous cyst   [place] [facility] [place], [place] T, NP   5 months ago Type 2 diabetes, diet controlled (HCC)   [place] [facility] [place], Melanie T, NP   5 months ago Sebaceous cyst   [place] [facility] [place], Melanie DASEN, NP

## 2023-10-03 DIAGNOSIS — J209 Acute bronchitis, unspecified: Secondary | ICD-10-CM | POA: Diagnosis not present

## 2023-11-01 ENCOUNTER — Other Ambulatory Visit: Payer: Self-pay | Admitting: Nurse Practitioner

## 2023-11-03 NOTE — Telephone Encounter (Signed)
 Requested Prescriptions  Pending Prescriptions Disp Refills   aspirin  EC 81 MG tablet [Pharmacy Med Name: ASPIRIN  LOW DOSE 81MG  EC TABLET DR] 90 tablet 1    Sig: TAKE ONE TABLET (81 MG TOTAL) BY MOUTH DAILY. SWALLOW WHOLE.     Analgesics:  NSAIDS - aspirin  Passed - 11/03/2023 10:13 AM      Passed - Cr in normal range and within 360 days    Creatinine, Ser  Date Value Ref Range Status  05/28/2023 0.71 0.57 - 1.00 mg/dL Final         Passed - eGFR is 10 or above and within 360 days    GFR calc Af Amer  Date Value Ref Range Status  10/16/2019 64 >59 mL/min/1.73 Final    Comment:    **[facility] currently reports eGFR in compliance with the current**   recommendations of the [employer]. [facility] will   update reporting as new guidelines are published from the NKF-ASN   Task force.    GFR, Estimated  Date Value Ref Range Status  03/13/2023 >60 >60 mL/min Final    Comment:    (NOTE) Calculated using the CKD-EPI Creatinine Equation (2021)    eGFR  Date Value Ref Range Status  05/28/2023 81 >59 mL/min/1.73 Final         Passed - Patient is not pregnant      Passed - Valid encounter within last 12 months    Recent Outpatient Visits           5 months ago Type 2 diabetes, diet controlled (HCC)   [place] [facility] [place], Nikiski T, NP   7 months ago Sebaceous cyst   [place] [facility] [place], Melanie T, NP   7 months ago Sebaceous cyst   [place] [facility] [place], [place] T, NP   7 months ago Type 2 diabetes, diet controlled (HCC)   [place] [facility] [place], Melanie T, NP   7 months ago Sebaceous cyst   [place] [facility] [place], Melanie DASEN, NP

## 2023-11-15 DIAGNOSIS — I2119 ST elevation (STEMI) myocardial infarction involving other coronary artery of inferior wall: Secondary | ICD-10-CM | POA: Diagnosis not present

## 2023-11-15 DIAGNOSIS — Z955 Presence of coronary angioplasty implant and graft: Secondary | ICD-10-CM | POA: Diagnosis not present

## 2023-11-15 DIAGNOSIS — Z951 Presence of aortocoronary bypass graft: Secondary | ICD-10-CM | POA: Diagnosis not present

## 2023-11-27 NOTE — Patient Instructions (Signed)

## 2023-11-29 ENCOUNTER — Ambulatory Visit: Admitting: Nurse Practitioner

## 2023-11-29 ENCOUNTER — Encounter: Payer: Self-pay | Admitting: Nurse Practitioner

## 2023-11-29 VITALS — BP 128/66 | HR 58 | Temp 98.4°F | Resp 14 | Ht 59.02 in | Wt 84.3 lb

## 2023-11-29 DIAGNOSIS — M87180 Osteonecrosis due to drugs, jaw: Secondary | ICD-10-CM

## 2023-11-29 DIAGNOSIS — R634 Abnormal weight loss: Secondary | ICD-10-CM

## 2023-11-29 DIAGNOSIS — M0609 Rheumatoid arthritis without rheumatoid factor, multiple sites: Secondary | ICD-10-CM | POA: Diagnosis not present

## 2023-11-29 DIAGNOSIS — M810 Age-related osteoporosis without current pathological fracture: Secondary | ICD-10-CM

## 2023-11-29 DIAGNOSIS — Z23 Encounter for immunization: Secondary | ICD-10-CM | POA: Diagnosis not present

## 2023-11-29 DIAGNOSIS — I152 Hypertension secondary to endocrine disorders: Secondary | ICD-10-CM | POA: Diagnosis not present

## 2023-11-29 DIAGNOSIS — E1169 Type 2 diabetes mellitus with other specified complication: Secondary | ICD-10-CM

## 2023-11-29 DIAGNOSIS — I252 Old myocardial infarction: Secondary | ICD-10-CM

## 2023-11-29 DIAGNOSIS — E1159 Type 2 diabetes mellitus with other circulatory complications: Secondary | ICD-10-CM | POA: Diagnosis not present

## 2023-11-29 DIAGNOSIS — E785 Hyperlipidemia, unspecified: Secondary | ICD-10-CM | POA: Diagnosis not present

## 2023-11-29 DIAGNOSIS — T458X5A Adverse effect of other primarily systemic and hematological agents, initial encounter: Secondary | ICD-10-CM

## 2023-11-29 DIAGNOSIS — E063 Autoimmune thyroiditis: Secondary | ICD-10-CM

## 2023-11-29 DIAGNOSIS — E119 Type 2 diabetes mellitus without complications: Secondary | ICD-10-CM | POA: Diagnosis not present

## 2023-11-29 DIAGNOSIS — R42 Dizziness and giddiness: Secondary | ICD-10-CM | POA: Insufficient documentation

## 2023-11-29 LAB — BAYER DCA HB A1C WAIVED: HB A1C (BAYER DCA - WAIVED): 6.4 % — ABNORMAL HIGH (ref 4.8–5.6)

## 2023-11-29 MED ORDER — FLUTICASONE PROPIONATE 50 MCG/ACT NA SUSP
2.0000 | Freq: Every day | NASAL | 6 refills | Status: AC
Start: 2023-11-29 — End: ?

## 2023-11-29 NOTE — Assessment & Plan Note (Signed)
On 09/23/20 -- Fosamax was stopped and not restarted.  She refuses repeat bone density testing.  Could consider possibly Prolia or IV therapy, would need endo referral for IV.  She refuses treatment at this time.

## 2023-11-29 NOTE — Assessment & Plan Note (Signed)
 Chronic, ongoing.  Continue current medication regimen and adjust as needed.  TSH and Free T4 today.  She continues on hyper side, but she does not like medication changes and refuses to change dosing.  Has tried changes in past and had similar side effects.  Wishes to maintain 75 MCG dosing, which we will at this time and she will alert provider if any issues. We have had multiple discussions on need to change medication dosing. Current concern is her weight loss, discussed with her today.

## 2023-11-29 NOTE — Assessment & Plan Note (Signed)
 Chronic, ongoing.  Fosamax  stopped > 5 years ago for holiday and will continue off due to jaw and teeth issues with treatment.  She refuses repeat bone density testing or trial of alternate medication like Prolia.  Works on chair exercises and being active at home + taking Vitamin D  and calcium  supplements.

## 2023-11-29 NOTE — Assessment & Plan Note (Signed)
 Stable.  Continue current medication regimen.  Plavix  and ASA for 1 year recommended by cardiology (stop date 03/13/24).  Will continue collaboration with cardiology. She is to notify cardiology or PCP if increase CP or more use of NTG.

## 2023-11-29 NOTE — Assessment & Plan Note (Signed)
Chronic, ongoing.  Continue current medication regimen and adjust as needed.  Can consider discontinuation of this if patient wishes to minimize medications in future.  Lipid panel today. 

## 2023-11-29 NOTE — Assessment & Plan Note (Signed)
 Stable. BP at goal on recheck. BB was stopped in past month due to bradycardia. Concern for fall risk.  Recommend she wear compression hose at home, on during day and off at night due to ongoing/intermittent dizziness with position changes.  Educated patient and family on this.  Recommend she monitor BP at least a few mornings a week at home and document.  LABS: CBC and CMP.

## 2023-11-29 NOTE — Assessment & Plan Note (Signed)
 Chronic, stable, diet-controlled.  A1c 6.4%, trend down.  Urine micro 22 May 2023. Recommend continued focus on diet at this time, discussed with her at length benefit of adding on medication -- however she refuses to start at this time.  Due to advanced age more lenient goal recommended, A1C <8 per geriatric society.  Avoid hypoglycemia.  Initiate medication in future if needed, could consider and SGLT2 for heart health.  - Foot and eye exams up to date. - Statin on board, no ACE/ARB due to lower BP levels at baseline. - Vaccines up to date.

## 2023-11-29 NOTE — Assessment & Plan Note (Signed)
 Intermittent since her NSTEMI, more recently since ear cleaning. Overall neuro exam reassuring. Recommend she trial Claritin daily for 10 days and will send in Flonase as ?more inner ear related at present. Recommend at home she wear compression hose on during day and off at night to help if orthostatic changes presenting.

## 2023-11-29 NOTE — Assessment & Plan Note (Signed)
 10 pounds since July, discussed with her son and her today. She reports less meal intake at times as foods do not taste the same anymore. Discussed with her concerns with this weight loss and that thyroid  medication may needed to be adjusted if remains hyper, which she adamantly refuses to do. Recommend adding on protein supplement shake twice a day to start between breakfast and lunch and between lunch and dinner. Discussed with her that nutrition plays a big role in healing.

## 2023-11-29 NOTE — Assessment & Plan Note (Signed)
Chronic, stable.  No further medications.  Is to return to rheumatology as needed.  At this time is asymptomatic. 

## 2023-11-29 NOTE — Progress Notes (Signed)
 BP 128/66 (BP Location: Left Arm, Patient Position: Sitting, Cuff Size: Normal)   Pulse (!) 58   Temp 98.4 F (36.9 C) (Oral)   Resp 14   Ht 4' 11.02 (1.499 m)   Wt 84 lb 4.8 oz (38.2 kg)   LMP  (LMP Unknown)   SpO2 98%   BMI 17.02 kg/m    Subjective:    Patient ID: Edna VEAR Barba, female    DOB: 06/15/1931, 89 y.o.   MRN: [id number]  HPI: KENLEE VOGT is a 89 y.o. female  Chief Complaint  Patient presents with   Follow-up    Overall doing ok. Does tend to get dizzy a lot.    Son at bedside with patient.  DIABETES May A1c 7%, at goal for advanced age.  No current medications, diet controlled.  Prefers to not take medication unless absolutely necessary.  Keeps diet very strict at home. Hypoglycemic episodes:no Polydipsia/polyuria: no Visual disturbance: no Chest pain: no Paresthesias: no Glucose Monitoring: no             Accucheck frequency: Not Checking             Fasting glucose:             Post prandial:             Evening:             Before meals: Taking Insulin ?: no             Long acting insulin :             Short acting insulin : Blood Pressure Monitoring: not checking Retinal Examination: Up to Date -- Dr. Mittie New Exam: Up to Date Pneumovax: Up to date Influenza: Up to Date Aspirin : no   HYPERLIPIDEMIA Takes Atorvastatin  80 MG daily, ASA, Plavix , + Omega 3. History of STEMI 03/12/23. She did not tolerate BB due to lower resting HR.  Saw cardiology last 11/15/23 with no changes. Continues to have episodes of dizziness, notices this every day.  Sometimes will last all day and then other times less.  Ongoing on and off since her NSTEMI. Denies N&V, CP, SOB, tinnitus, headache.  Cannot bend down as episodes will be bad. Triggers: bending, sometimes head side to side, rolling over in bed. On 10/03/23 went to UC and they did pull a lot of wax out of her ear.  Does get CP once and awhile when overworks herself, vacuuming or cutting bushes outside. Takes  NTG and rests then it will go away. She reports not mentioning CP or dizziness to cardiologist, advised her to ensure to share these things at visits with them. Hyperlipidemia status: good compliance Satisfied with current treatment?  yes Side effects:  no Medication compliance: good compliance Supplements: fish oil Aspirin :  yes The ASCVD Risk score (Arnett DK, et al., 2019) failed to calculate for the following reasons:   The 2019 ASCVD risk score is only valid for ages 35 to 17   Risk score cannot be calculated because patient has a medical history suggesting prior/existing ASCVD Chest pain:  no Coronary artery disease:  no Family history CAD:  no Family history early CAD:  no   HYPOTHYROIDISM Takes Levothyroxine  75 MCG daily.  Last TSH was 0.442, we have discussed ongoing low levels (hyper) multiple times since October 2023.  Tried changing dose once, but if made her feel bad, which is similar to what happened in past with previous PCP, gets too tired  and heart does no beat right when dose changed she reports. Reports they tried changing in past too and it did not work.  She absolutely refuses to change dosing on this, had discussed risks with hyper levels. Concern is has lost 11 pounds since July 2025, which discussed with patient and son today. She still continues to refuse medication changes. Satisfied with current treatment? yes Medication side effects: no Medication compliance: good compliance Etiology of hypothyroidism:  Recent dose adjustment: none Fatigue: no  Cold intolerance: yes Heat intolerance: no Weight gain: no Weight loss: no Constipation: no Diarrhea/loose stools: no Palpitations: no Lower extremity edema: no Anxiety/depressed mood: no    OSTEOPOROSIS & POLYMYALGIA RHEUMATICA: DEXA 03/06/2019 with T score -3.1.  She continues to refuse further imaging. Followed by Dr. Kernodle at [facility] in past for RA and osteoporosis, last visit in February 2021. To return only as  needed. Fosamax  discontinued on 09/23/20 due to jaw and dentition issues.  Is taking Vitamin D  and calcium  daily. Had visit with specialist at [facility] for jaw issues in past. Does strength exercises at home for fall prevention, very active at baseline. No recent falls or fractures. Does not want to have any other treatments for osteoporosis.  Satisfied with current treatment?: yes - none, she wishes none Medication side effects: yes - jaw and dental issues presented Past osteoporosis medications/treatments: Fosamax  Adequate calcium  & vitamin D : yes Intolerance to bisphosphonates:yes Weight bearing exercises: yes      11/29/2023   10:40 AM 07/13/2023    1:16 PM 05/28/2023   10:54 AM 03/05/2023    2:58 PM 12/03/2022    1:54 PM  Depression screen PHQ 2/9  Decreased Interest 0 0 0 0 1  Down, Depressed, Hopeless 0 0 0 0 0  PHQ - 2 Score 0 0 0 0 1  Altered sleeping 0 0 0 0 0  Tired, decreased energy 0 0 0 0 1  Change in appetite 0 0 0 0 0  Feeling bad or failure about yourself  0 0 0 0 0  Trouble concentrating 0 0 0 0 1  Moving slowly or fidgety/restless 0 0 0 0 1  Suicidal thoughts 0 0 0 0 0  PHQ-9 Score 0 0  0  0  4   Difficult doing work/chores  Not difficult at all Not difficult at all  Not difficult at all     Data saved with a previous flowsheet row definition       11/29/2023   10:40 AM 05/28/2023   10:55 AM 12/03/2022    1:54 PM 11/26/2022   11:42 AM  GAD 7 : Generalized Anxiety Score  Nervous, Anxious, on Edge 0 0 0 0  Control/stop worrying 0 0 0 0  Worry too much - different things 0 0 1 0  Trouble relaxing 0 0 1 1  Restless 0 0 1 0  Easily annoyed or irritable 0 0 0 0  Afraid - awful might happen 0 0 0 0  Total GAD 7 Score 0 0 3 1  Anxiety Difficulty  Not difficult at all Not difficult at all Somewhat difficult   Relevant past medical, surgical, family and social history reviewed and updated as indicated. Interim medical history since our last visit reviewed. Allergies and  medications reviewed and updated.  Review of Systems  Constitutional:  Negative for activity change, appetite change, diaphoresis, fatigue and fever.  Respiratory:  Negative for cough, chest tightness and shortness of breath.   Cardiovascular:  Negative  for chest pain, palpitations and leg swelling.  Gastrointestinal: Negative.   Endocrine: Negative.   Neurological: Negative.   Psychiatric/Behavioral: Negative.    All other systems reviewed and are negative.  Per HPI unless specifically indicated above     Objective:    BP 128/66 (BP Location: Left Arm, Patient Position: Sitting, Cuff Size: Normal)   Pulse (!) 58   Temp 98.4 F (36.9 C) (Oral)   Resp 14   Ht 4' 11.02 (1.499 m)   Wt 84 lb 4.8 oz (38.2 kg)   LMP  (LMP Unknown)   SpO2 98%   BMI 17.02 kg/m   Wt Readings from Last 3 Encounters:  11/29/23 84 lb 4.8 oz (38.2 kg)  07/13/23 95 lb (43.1 kg)  05/28/23 96 lb 6.4 oz (43.7 kg)    Physical Exam Vitals and nursing note reviewed.  Constitutional:      General: She is awake. She is not in acute distress.    Appearance: She is well-developed. She is not ill-appearing.  HENT:     Head: Normocephalic.     Right Ear: Hearing normal.     Left Ear: Hearing normal.  Eyes:     General: Lids are normal.        Right eye: No discharge.        Left eye: No discharge.     Conjunctiva/sclera: Conjunctivae normal.     Pupils: Pupils are equal, round, and reactive to light.  Neck:     Thyroid : No thyromegaly.     Vascular: No carotid bruit.  Cardiovascular:     Rate and Rhythm: Regular rhythm. Bradycardia present.     Heart sounds: Normal heart sounds. No murmur heard.    No gallop.  Pulmonary:     Effort: Pulmonary effort is normal. No accessory muscle usage or respiratory distress.     Breath sounds: Normal breath sounds.  Abdominal:     General: Bowel sounds are normal.     Palpations: Abdomen is soft.  Musculoskeletal:     Cervical back: Normal range of motion and  neck supple.     Right lower leg: No edema.     Left lower leg: No edema.  Skin:    General: Skin is warm and dry.  Neurological:     Mental Status: She is alert and oriented to person, place, and time.  Psychiatric:        Attention and Perception: Attention normal.        Mood and Affect: Mood normal.        Behavior: Behavior normal. Behavior is cooperative.        Thought Content: Thought content normal.        Judgment: Judgment normal.    Results for orders placed or performed in visit on 06/01/23  HM DIABETES EYE EXAM   Collection Time: 05/31/23  8:03 AM  Result Value Ref Range   HM Diabetic Eye Exam Retinopathy (A) No Retinopathy      Assessment & Plan:   Problem List Items Addressed This Visit       Cardiovascular and Mediastinum   Hypertension associated with diabetes (HCC)   Stable. BP at goal on recheck. BB was stopped in past month due to bradycardia. Concern for fall risk.  Recommend she wear compression hose at home, on during day and off at night due to ongoing/intermittent dizziness with position changes.  Educated patient and family on this.  Recommend she monitor BP at least a few  mornings a week at home and document.  LABS: CBC and CMP.       Relevant Orders   Bayer DCA Hb A1c Waived   CBC with Differential/Platelet   Comprehensive metabolic panel with GFR     Endocrine   Type 2 diabetes, diet controlled (HCC) - Primary   Chronic, stable, diet-controlled.  A1c 6.4%, trend down.  Urine micro 22 May 2023. Recommend continued focus on diet at this time, discussed with her at length benefit of adding on medication -- however she refuses to start at this time.  Due to advanced age more lenient goal recommended, A1C <8 per geriatric society.  Avoid hypoglycemia.  Initiate medication in future if needed, could consider and SGLT2 for heart health.  - Foot and eye exams up to date. - Statin on board, no ACE/ARB due to lower BP levels at baseline. - Vaccines up to  date.      Relevant Orders   Bayer DCA Hb A1c Waived   Hyperlipidemia associated with type 2 diabetes mellitus (HCC)   Chronic, ongoing.  Continue current medication regimen and adjust as needed.  Can consider discontinuation of this if patient wishes to minimize medications in future.  Lipid panel today.      Relevant Orders   Bayer DCA Hb A1c Waived   Comprehensive metabolic panel with GFR   Lipid Panel w/o Chol/HDL Ratio   Hashimoto's thyroiditis   Chronic, ongoing.  Continue current medication regimen and adjust as needed.  TSH and Free T4 today.  She continues on hyper side, but she does not like medication changes and refuses to change dosing.  Has tried changes in past and had similar side effects.  Wishes to maintain 75 MCG dosing, which we will at this time and she will alert provider if any issues. We have had multiple discussions on need to change medication dosing. Current concern is her weight loss, discussed with her today.      Relevant Orders   T4, free   TSH     Musculoskeletal and Integument   Rheumatoid arthritis of multiple sites without rheumatoid factor (HCC)   Chronic, stable.  No further medications.  Is to return to rheumatology as needed.  At this time is asymptomatic.      Osteoporosis   Chronic, ongoing.  Fosamax  stopped > 5 years ago for holiday and will continue off due to jaw and teeth issues with treatment.  She refuses repeat bone density testing or trial of alternate medication like Prolia.  Works on chair exercises and being active at home + taking Vitamin D  and calcium  supplements.      Relevant Orders   VITAMIN D  25 Hydroxy (Vit-D Deficiency, Fractures)   DG Bone Density     Other   Weight loss, unintentional   10 pounds since July, discussed with her son and her today. She reports less meal intake at times as foods do not taste the same anymore. Discussed with her concerns with this weight loss and that thyroid  medication may needed to be  adjusted if remains hyper, which she adamantly refuses to do. Recommend adding on protein supplement shake twice a day to start between breakfast and lunch and between lunch and dinner. Discussed with her that nutrition plays a big role in healing.      History of ST elevation myocardial infarction (STEMI)   Stable.  Continue current medication regimen.  Plavix  and ASA for 1 year recommended by cardiology (stop date 03/13/24).  Will continue collaboration with cardiology. She is to notify cardiology or PCP if increase CP or more use of NTG.      Dizziness   Intermittent since her NSTEMI, more recently since ear cleaning. Overall neuro exam reassuring. Recommend she trial Claritin daily for 10 days and will send in Flonase as ?more inner ear related at present. Recommend at home she wear compression hose on during day and off at night to help if orthostatic changes presenting.      Bisphosphonate-related jaw necrosis   On 09/23/20 -- Fosamax  was stopped and not restarted.  She refuses repeat bone density testing.  Could consider possibly Prolia or IV therapy, would need endo referral for IV.  She refuses treatment at this time.      Other Visit Diagnoses       Flu vaccine need       Flu vaccine today, educated patient.   Relevant Orders   Flu vaccine HIGH DOSE PF(Fluzone Trivalent) (Completed)        Follow up plan: Return in about 8 weeks (around 01/24/2024) for WEIGHT CHECK.

## 2023-11-30 ENCOUNTER — Ambulatory Visit: Payer: Self-pay | Admitting: Nurse Practitioner

## 2023-11-30 LAB — CBC WITH DIFFERENTIAL/PLATELET
Basophils Absolute: 0.1 x10E3/uL (ref 0.0–0.2)
Basos: 1 %
EOS (ABSOLUTE): 0.1 x10E3/uL (ref 0.0–0.4)
Eos: 1 %
Hematocrit: 45.8 % (ref 34.0–46.6)
Hemoglobin: 14.4 g/dL (ref 11.1–15.9)
Immature Grans (Abs): 0 x10E3/uL (ref 0.0–0.1)
Immature Granulocytes: 0 %
Lymphocytes Absolute: 1.6 x10E3/uL (ref 0.7–3.1)
Lymphs: 21 %
MCH: 29.7 pg (ref 26.6–33.0)
MCHC: 31.4 g/dL — ABNORMAL LOW (ref 31.5–35.7)
MCV: 94 fL (ref 79–97)
Monocytes Absolute: 0.5 x10E3/uL (ref 0.1–0.9)
Monocytes: 7 %
Neutrophils Absolute: 5.6 x10E3/uL (ref 1.4–7.0)
Neutrophils: 70 %
Platelets: 195 x10E3/uL (ref 150–450)
RBC: 4.85 x10E6/uL (ref 3.77–5.28)
RDW: 12.4 % (ref 11.7–15.4)
WBC: 7.9 x10E3/uL (ref 3.4–10.8)

## 2023-11-30 LAB — LIPID PANEL W/O CHOL/HDL RATIO
Cholesterol, Total: 115 mg/dL (ref 100–199)
HDL: 45 mg/dL (ref >39)
LDL Chol Calc (NIH): 54 mg/dL (ref 0–99)
Triglycerides: 78 mg/dL (ref 0–149)
VLDL Cholesterol Cal: 16 mg/dL (ref 5–40)

## 2023-11-30 LAB — COMPREHENSIVE METABOLIC PANEL WITH GFR
ALT: 41 IU/L — ABNORMAL HIGH (ref 0–32)
AST: 51 IU/L — ABNORMAL HIGH (ref 0–40)
Albumin: 4.5 g/dL (ref 3.6–4.6)
Alkaline Phosphatase: 160 IU/L — ABNORMAL HIGH (ref 48–129)
BUN/Creatinine Ratio: 19 (ref 12–28)
BUN: 14 mg/dL (ref 10–36)
Bilirubin Total: 1.1 mg/dL (ref 0.0–1.2)
CO2: 19 mmol/L — ABNORMAL LOW (ref 20–29)
Calcium: 10.1 mg/dL (ref 8.7–10.3)
Chloride: 101 mmol/L (ref 96–106)
Creatinine, Ser: 0.72 mg/dL (ref 0.57–1.00)
Globulin, Total: 2.6 g/dL (ref 1.5–4.5)
Glucose: 119 mg/dL — ABNORMAL HIGH (ref 70–99)
Potassium: 4.1 mmol/L (ref 3.5–5.2)
Sodium: 140 mmol/L (ref 134–144)
Total Protein: 7.1 g/dL (ref 6.0–8.5)
eGFR: 79 mL/min/1.73 (ref >59)

## 2023-11-30 LAB — TSH: TSH: 0.106 u[IU]/mL — ABNORMAL LOW (ref 0.450–4.500)

## 2023-11-30 LAB — VITAMIN D 25 HYDROXY (VIT D DEFICIENCY, FRACTURES): Vit D, 25-Hydroxy: 44.8 ng/mL (ref 30.0–100.0)

## 2023-11-30 LAB — T4, FREE: Free T4: 1.97 ng/dL — ABNORMAL HIGH (ref 0.82–1.77)

## 2023-11-30 NOTE — Progress Notes (Signed)
 Contacted via MyChart, but please call to ensure she saw message:  Good morning Senaida, your labs have returned: - Thyroid  labs are slightly more hyper this check, which is concerning with your weight loss. I know you have struggled with Levothyroxine  dose changes in past, but I would even recommend changing to 75 MCG, as you are currently taking, 5 days a week and 50 MCG two days a week to see if we can get you less on the hyper side as it can cause a lot of issues, including weight loss. - CBC overall stable. - Kidney function, creatinine and eGFR, remains normal. Liver function showing some mild elevation this check, we will recheck next visit.  - Remainder of labs stable. Any questions? Keep being amazing!!  Thank you for allowing me to participate in your care.  I appreciate you. Kindest regards, [place]

## 2024-01-03 ENCOUNTER — Other Ambulatory Visit: Payer: Self-pay | Admitting: Nurse Practitioner

## 2024-01-22 NOTE — Patient Instructions (Signed)
 Hyperthyroidism Hyperthyroidism refers to a thyroid  gland that is too active or overactive. The thyroid  gland is a small gland located in the lower front part of the neck, just in front of the windpipe (trachea). This gland makes hormones that: Help control how the body uses food for energy (metabolism). Help the heart and brain work well. Keep your bones strong. When the thyroid  is overactive, it produces too much of a hormone called thyroxine. What are the causes? This condition may be caused by: Graves' disease. This is a disorder in which the body's disease-fighting system (immune system) attacks the thyroid  gland. This is the most common cause. Inflammation of the thyroid  gland. A tumor in the thyroid  gland. Use of certain medicines, including: Prescription thyroid  hormone replacement. Herbal supplements that mimic thyroid  hormones. Amiodarone therapy. Solid or fluid-filled lumps within your thyroid  gland (thyroid  nodules). Taking in a large amount of iodine from foods or medicines. What increases the risk? You are more likely to develop this condition if: You are female. You have a family history of thyroid  conditions. You smoke tobacco. You use a medicine called lithium. You take medicines that affect the immune system (immunosuppressants). What are the signs or symptoms? Symptoms of this condition include: Nervousness. Inability to tolerate heat. Diarrhea. Rapid heart rate. Shaky hands. Restlessness. Sleep problems. Other symptoms may include: Heart skipping beats or making extra beats. Unexplained weight loss. Change in the texture of hair or skin. Loss of menstruation. Fatigue. Enlarged thyroid  gland or a lump in the thyroid  (nodule). You may also have symptoms of Graves' disease, which may include: Protruding eyes. Dry eyes. Red or swollen eyes. Problems with vision. How is this diagnosed? This condition may be diagnosed based on: Your symptoms and medical  history. A physical exam. Blood tests. Thyroid  ultrasound. This test involves using sound waves to produce images of the thyroid  gland. A thyroid  scan. A radioactive substance is injected into a vein, and images show how much iodine is present in the thyroid . Radioactive iodine uptake test (RAIU). A small amount of radioactive iodine is given by mouth to see how much iodine the thyroid  absorbs after a certain amount of time. How is this treated? Treatment depends on the cause and severity of the condition. Treatment may include: Medicines to reduce the amount of thyroid  hormone your body makes. Medicines to help manage your symptoms. Radioactive iodine treatment (radioiodine therapy). This involves swallowing a small dose of radioactive iodine, in capsule or liquid form, to kill thyroid  cells. Surgery to remove part or all of your thyroid  gland. You may need to take thyroid  hormone replacement medicine for the rest of your life after thyroid  surgery. Follow these instructions at home:  Take over-the-counter and prescription medicines only as told by your health care provider. Do not use any products that contain nicotine or tobacco. These products include cigarettes, chewing tobacco, and vaping devices, such as e-cigarettes. If you need help quitting, ask your health care provider. Follow any instructions from your health care provider about diet. You may be instructed to limit foods that contain iodine. Keep all follow-up visits. You will need to have blood tests regularly so that your health care provider can monitor your condition. Where to find more information [employer] of Diabetes and Digestive and Kidney Diseases: [email or web address] Contact a health care provider if: Your symptoms do not get better with treatment. You have a fever. You have abdominal pain. You feel dizzy. You are taking thyroid  hormone replacement medicine and: You have  symptoms of depression. You feel like you  are tired all the time. You gain weight. Get help right away if: You have sudden, unexplained confusion or other mental changes. You have chest pain. You have fast or irregular heartbeats (palpitations). You have difficulty breathing. These symptoms may be an emergency. Get help right away. Call 911. Do not wait to see if the symptoms will go away. Do not drive yourself to the hospital. Summary The thyroid  gland is a small gland located in the lower front part of the neck, just in front of the windpipe. Hyperthyroidism is when the thyroid  gland is too active and produces too much of a hormone called thyroxine. The most common cause is Graves' disease, a disorder in which your immune system attacks the thyroid  gland. Hyperthyroidism can cause various symptoms, such as unexplained weight loss, nervousness, inability to tolerate heat, or changes in your heartbeat. Treatment may include medicine to reduce the amount of thyroid  hormone your body makes, radioiodine therapy, surgery, or medicines to manage symptoms. This information is not intended to replace advice given to you by your health care provider. Make sure you discuss any questions you have with your health care provider. Document Revised: 02/21/2021 Document Reviewed: 02/21/2021 Elsevier Patient Education  2024 [employer].

## 2024-01-24 ENCOUNTER — Ambulatory Visit: Admitting: Nurse Practitioner

## 2024-01-24 ENCOUNTER — Encounter: Payer: Self-pay | Admitting: Nurse Practitioner

## 2024-01-24 VITALS — BP 121/74 | HR 65 | Resp 17 | Ht 59.02 in | Wt 93.6 lb

## 2024-01-24 DIAGNOSIS — J392 Other diseases of pharynx: Secondary | ICD-10-CM | POA: Diagnosis not present

## 2024-01-24 DIAGNOSIS — R634 Abnormal weight loss: Secondary | ICD-10-CM

## 2024-01-24 DIAGNOSIS — E063 Autoimmune thyroiditis: Secondary | ICD-10-CM

## 2024-01-24 MED ORDER — FAMOTIDINE 20 MG PO TABS: 20.0000 mg | ORAL_TABLET | Freq: Every day | ORAL | 1 refills | Status: AC

## 2024-01-24 NOTE — Assessment & Plan Note (Signed)
 Chronic, ongoing.  Continue current medication regimen and adjust as needed.  TSH and Free T4 up to date.  She continues on hyper side, but she does not like medication changes and adamantly refuses to change dosing.  Has tried changes in past and had similar side effects.  Wishes to maintain 75 MCG dosing, which we will at this time and she will alert provider if any issues. We have had multiple discussions on need to change medication dosing. Discussed with son and her concerns about more hyper levels and side effects that could take place, they prefer to maintain her dose and focus on quality of life as dose changes have never been tolerated.

## 2024-01-24 NOTE — Progress Notes (Signed)
 "  BP 121/74 (BP Location: Left Arm, Patient Position: Sitting, Cuff Size: Normal)   Pulse 65   Resp 17   Ht 4' 11.02 (1.499 m)   Wt 93 lb 9.6 oz (42.5 kg)   LMP  (LMP Unknown)   SpO2 95%   BMI 18.89 kg/m    Subjective:    Patient ID: Kirsten Riley, female    DOB: [birth year], 89 y.o.   MRN: [id number]  HPI: Kirsten Riley is a 89 y.o. female  Chief Complaint  Patient presents with   Weight Check   Nasal Congestion    Gets phlegm in her throat every so often that she can't get up and wont go down. Read online that it could lead to pneumonia. Takes mucinex  but it still comes and goes.   Son present at bedside.  HYPOTHYROIDISM Continues Levothyroxine  75 MCG daily. TSH in November 0.106, we have discussed ongoing low levels (hyper) multiple times since October 2023.  Tried changing dose once, but if made her feel bad, which is similar to what happened in past with previous PCP, gets too tired and heart does no beat right when dose changed she reports. Reports they tried changing in past too and it did not work.  She absolutely refuses to change dosing on this, had discussed risks with hyper levels. Since last visit she is no longer losing weight and has gained 9 lbs. Thyroid  control status:stable Satisfied with current treatment? yes Medication side effects: no Medication compliance: good compliance Etiology of hypothyroidism:  Recent dose adjustment:no Fatigue: no Cold intolerance: no Heat intolerance: no Weight gain: no Weight loss: no Constipation: no Diarrhea/loose stools: no Palpitations: no Lower extremity edema: no Anxiety/depressed mood: no   ALLERGIES Using Flonase , but not offering 100% benefit. At times feels like things are getting stuck in her throat. States it is phlegm in her throat. Duration: chronic Runny nose: yes clear Nasal congestion: no Nasal itching: no Sneezing: no Eye swelling, itching or discharge: no Post nasal drip: yes Cough: no Sinus  pressure: no  Ear pain: none Ear pressure: none Fever: none Symptoms occur seasonally: no Symptoms occur perenially: no Satisfied with current treatment: yes Allergist evaluation in past: no Allergen injection immunotherapy: no Recurrent sinus infections: no ENT evaluation in past: no Known environmental allergy: no Indoor pets: no History of asthma: no Current allergy medications: Flonase  Treatments attempted: as above   Relevant past medical, surgical, family and social history reviewed and updated as indicated. Interim medical history since our last visit reviewed. Allergies and medications reviewed and updated.  Review of Systems  Constitutional:  Negative for activity change, appetite change, diaphoresis, fatigue and fever.  HENT:  Positive for postnasal drip and rhinorrhea. Negative for congestion, ear discharge and ear pain.   Respiratory:  Negative for cough, chest tightness, shortness of breath and wheezing.   Cardiovascular:  Negative for chest pain, palpitations and leg swelling.  Gastrointestinal: Negative.   Endocrine: Negative for cold intolerance and heat intolerance.  Neurological: Negative.     Per HPI unless specifically indicated above     Objective:    BP 121/74 (BP Location: Left Arm, Patient Position: Sitting, Cuff Size: Normal)   Pulse 65   Resp 17   Ht 4' 11.02 (1.499 m)   Wt 93 lb 9.6 oz (42.5 kg)   LMP  (LMP Unknown)   SpO2 95%   BMI 18.89 kg/m   Wt Readings from Last 3 Encounters:  01/24/24 93 lb 9.6 oz (  42.5 kg)  11/29/23 84 lb 4.8 oz (38.2 kg)  07/13/23 95 lb (43.1 kg)    Physical Exam Vitals and nursing note reviewed.  Constitutional:      General: She is awake. She is not in acute distress.    Appearance: She is well-developed. She is not ill-appearing.  HENT:     Head: Normocephalic.     Right Ear: Hearing, tympanic membrane, ear canal and external ear normal. No middle ear effusion.     Left Ear: Hearing, tympanic membrane,  ear canal and external ear normal.  No middle ear effusion.     Nose: Nose normal.     Right Sinus: No maxillary sinus tenderness or frontal sinus tenderness.     Left Sinus: No maxillary sinus tenderness or frontal sinus tenderness.     Mouth/Throat:     Mouth: Mucous membranes are moist.     Pharynx: Posterior oropharyngeal erythema (mild) and postnasal drip present. No pharyngeal swelling or oropharyngeal exudate.  Eyes:     General: Lids are normal.        Right eye: No discharge.        Left eye: No discharge.     Conjunctiva/sclera: Conjunctivae normal.     Pupils: Pupils are equal, round, and reactive to light.  Neck:     Thyroid : No thyromegaly.     Vascular: No carotid bruit.  Cardiovascular:     Rate and Rhythm: Normal rate and regular rhythm.     Heart sounds: Normal heart sounds. No murmur heard.    No gallop.  Pulmonary:     Effort: Pulmonary effort is normal. No accessory muscle usage or respiratory distress.     Breath sounds: Normal breath sounds. No decreased breath sounds, wheezing or rales.  Abdominal:     General: Bowel sounds are normal.     Palpations: Abdomen is soft.  Musculoskeletal:     Cervical back: Normal range of motion and neck supple.     Right lower leg: No edema.     Left lower leg: No edema.  Skin:    General: Skin is warm and dry.  Neurological:     Mental Status: She is alert and oriented to person, place, and time.  Psychiatric:        Attention and Perception: Attention normal.        Mood and Affect: Mood normal.        Behavior: Behavior normal. Behavior is cooperative.        Thought Content: Thought content normal.        Judgment: Judgment normal.    Results for orders placed or performed in visit on 11/29/23  Bayer DCA Hb A1c Waived   Collection Time: 11/29/23 10:47 AM  Result Value Ref Range   HB A1C (BAYER DCA - WAIVED) 6.4 (H) 4.8 - 5.6 %  T4, free   Collection Time: 11/29/23 11:52 AM  Result Value Ref Range   Free T4  1.97 (H) 0.82 - 1.77 ng/dL  CBC with Differential/Platelet   Collection Time: 11/29/23 11:52 AM  Result Value Ref Range   WBC 7.9 3.4 - 10.8 x10E3/uL   RBC 4.85 3.77 - 5.28 x10E6/uL   Hemoglobin 14.4 11.1 - 15.9 g/dL   Hematocrit 54.1 65.9 - 46.6 %   MCV 94 79 - 97 fL   MCH 29.7 26.6 - 33.0 pg   MCHC 31.4 (L) 31.5 - 35.7 g/dL   RDW 87.5 88.2 - 84.5 %  Platelets 195 150 - 450 x10E3/uL   Neutrophils 70 Not Estab. %   Lymphs 21 Not Estab. %   Monocytes 7 Not Estab. %   Eos 1 Not Estab. %   Basos 1 Not Estab. %   Neutrophils Absolute 5.6 1.4 - 7.0 x10E3/uL   Lymphocytes Absolute 1.6 0.7 - 3.1 x10E3/uL   Monocytes Absolute 0.5 0.1 - 0.9 x10E3/uL   EOS (ABSOLUTE) 0.1 0.0 - 0.4 x10E3/uL   Basophils Absolute 0.1 0.0 - 0.2 x10E3/uL   Immature Granulocytes 0 Not Estab. %   Immature Grans (Abs) 0.0 0.0 - 0.1 x10E3/uL  Comprehensive metabolic panel with GFR   Collection Time: 11/29/23 11:52 AM  Result Value Ref Range   Glucose 119 (H) 70 - 99 mg/dL   BUN 14 10 - 36 mg/dL   Creatinine, Ser 9.27 0.57 - 1.00 mg/dL   eGFR 79 >40 fO/fpw/8.26   BUN/Creatinine Ratio 19 12 - 28   Sodium 140 134 - 144 mmol/L   Potassium 4.1 3.5 - 5.2 mmol/L   Chloride 101 96 - 106 mmol/L   CO2 19 (L) 20 - 29 mmol/L   Calcium  10.1 8.7 - 10.3 mg/dL   Total Protein 7.1 6.0 - 8.5 g/dL   Albumin 4.5 3.6 - 4.6 g/dL   Globulin, Total 2.6 1.5 - 4.5 g/dL   Bilirubin Total 1.1 0.0 - 1.2 mg/dL   Alkaline Phosphatase 160 (H) 48 - 129 IU/L   AST 51 (H) 0 - 40 IU/L   ALT 41 (H) 0 - 32 IU/L  Lipid Panel w/o Chol/HDL Ratio   Collection Time: 11/29/23 11:52 AM  Result Value Ref Range   Cholesterol, Total 115 100 - 199 mg/dL   Triglycerides 78 0 - 149 mg/dL   HDL 45 >60 mg/dL   VLDL Cholesterol Cal 16 5 - 40 mg/dL   LDL Chol Calc (NIH) 54 0 - 99 mg/dL  TSH   Collection Time: 11/29/23 11:52 AM  Result Value Ref Range   TSH 0.106 (L) 0.450 - 4.500 uIU/mL  VITAMIN D  25 Hydroxy (Vit-D Deficiency, Fractures)    Collection Time: 11/29/23 11:52 AM  Result Value Ref Range   Vit D, 25-Hydroxy 44.8 30.0 - 100.0 ng/mL      Assessment & Plan:   Problem List Items Addressed This Visit       Endocrine   Hashimoto's thyroiditis   Chronic, ongoing.  Continue current medication regimen and adjust as needed.  TSH and Free T4 up to date.  She continues on hyper side, but she does not like medication changes and adamantly refuses to change dosing.  Has tried changes in past and had similar side effects.  Wishes to maintain 75 MCG dosing, which we will at this time and she will alert provider if any issues. We have had multiple discussions on need to change medication dosing. Discussed with son and her concerns about more hyper levels and side effects that could take place, they prefer to maintain her dose and focus on quality of life as dose changes have never been tolerated.        Other   Weight loss, unintentional - Primary   Has gained 9 lbs since last visit, discussed with her son and her today. She reports less meal intake at times as foods does not taste the same anymore. Discussed with her concerns with ant weight loss and that thyroid  medication may needed to be adjusted if remains hyper, which she adamantly refuses to do.  Recommend drinking protein supplement shake twice a day to start between breakfast and lunch and between lunch and dinner. Discussed with her that nutrition plays a big role in healing.      Throat irritation   ? If reflux issues since this started after her MI when Plavix  and ASA started. Will send in trial of Pepcid  20 MG daily. Educated her on this. Continue TUMS as needed, which she reports dose help. If ongoing or worsening could consider referral to GI.        Follow up plan: Return in about 18 weeks (around 05/29/2024) for T2DM, HTN/HLD, Hypothyroid.      "

## 2024-01-24 NOTE — Assessment & Plan Note (Signed)
?   If reflux issues since this started after her MI when Plavix  and ASA started. Will send in trial of Pepcid  20 MG daily. Educated her on this. Continue TUMS as needed, which she reports dose help. If ongoing or worsening could consider referral to GI.

## 2024-01-24 NOTE — Assessment & Plan Note (Signed)
 Has gained 9 lbs since last visit, discussed with her son and her today. She reports less meal intake at times as foods does not taste the same anymore. Discussed with her concerns with ant weight loss and that thyroid  medication may needed to be adjusted if remains hyper, which she adamantly refuses to do. Recommend drinking protein supplement shake twice a day to start between breakfast and lunch and between lunch and dinner. Discussed with her that nutrition plays a big role in healing.

## 2024-05-29 ENCOUNTER — Ambulatory Visit: Admitting: Nurse Practitioner

## 2024-07-18 ENCOUNTER — Ambulatory Visit
